# Patient Record
Sex: Male | Born: 1960 | Race: White | Hispanic: No | Smoking: Current some day smoker
Health system: Southern US, Community
[De-identification: ages and names within clinical notes are randomized; demographics above are authoritative.]

## PROBLEM LIST (undated history)

## (undated) DIAGNOSIS — Z720 Tobacco use: Secondary | ICD-10-CM

## (undated) DIAGNOSIS — I214 Non-ST elevation (NSTEMI) myocardial infarction: Secondary | ICD-10-CM

## (undated) DIAGNOSIS — E781 Pure hyperglyceridemia: Secondary | ICD-10-CM

## (undated) DIAGNOSIS — T4145XA Adverse effect of unspecified anesthetic, initial encounter: Secondary | ICD-10-CM

## (undated) DIAGNOSIS — I10 Essential (primary) hypertension: Secondary | ICD-10-CM

## (undated) DIAGNOSIS — I2510 Atherosclerotic heart disease of native coronary artery without angina pectoris: Secondary | ICD-10-CM

## (undated) DIAGNOSIS — M1990 Unspecified osteoarthritis, unspecified site: Secondary | ICD-10-CM

## (undated) DIAGNOSIS — R195 Other fecal abnormalities: Secondary | ICD-10-CM

## (undated) DIAGNOSIS — I509 Heart failure, unspecified: Secondary | ICD-10-CM

## (undated) DIAGNOSIS — J45909 Unspecified asthma, uncomplicated: Secondary | ICD-10-CM

## (undated) DIAGNOSIS — E785 Hyperlipidemia, unspecified: Secondary | ICD-10-CM

## (undated) DIAGNOSIS — J449 Chronic obstructive pulmonary disease, unspecified: Secondary | ICD-10-CM

## (undated) HISTORY — DX: Heart failure, unspecified: I50.9

## (undated) HISTORY — DX: Unspecified asthma, uncomplicated: J45.909

## (undated) HISTORY — PX: CARDIAC CATHETERIZATION: SHX172

## (undated) HISTORY — DX: Pure hyperglyceridemia: E78.1

## (undated) HISTORY — DX: Other fecal abnormalities: R19.5

## (undated) HISTORY — DX: Tobacco use: Z72.0

## (undated) HISTORY — DX: Essential (primary) hypertension: I10

## (undated) HISTORY — DX: Non-ST elevation (NSTEMI) myocardial infarction: I21.4

## (undated) HISTORY — DX: Hyperlipidemia, unspecified: E78.5

## (undated) HISTORY — PX: UMBILICAL HERNIA REPAIR: SHX196

## (undated) HISTORY — DX: Chronic obstructive pulmonary disease, unspecified: J44.9

## (undated) HISTORY — DX: Atherosclerotic heart disease of native coronary artery without angina pectoris: I25.10

---

## 1996-05-30 HISTORY — PX: CLOSED REDUCTION SHOULDER DISLOCATION: SUR242

## 2003-08-20 ENCOUNTER — Emergency Department (HOSPITAL_COMMUNITY): Admission: EM | Admit: 2003-08-20 | Discharge: 2003-08-20 | Payer: Self-pay | Admitting: Family Medicine

## 2004-06-09 ENCOUNTER — Ambulatory Visit (HOSPITAL_COMMUNITY): Admission: RE | Admit: 2004-06-09 | Discharge: 2004-06-09 | Payer: Self-pay | Admitting: Family Medicine

## 2006-09-30 ENCOUNTER — Emergency Department (HOSPITAL_COMMUNITY): Admission: EM | Admit: 2006-09-30 | Discharge: 2006-09-30 | Payer: Self-pay | Admitting: Emergency Medicine

## 2008-11-14 ENCOUNTER — Emergency Department (HOSPITAL_COMMUNITY): Admission: EM | Admit: 2008-11-14 | Discharge: 2008-11-14 | Payer: Self-pay | Admitting: Family Medicine

## 2008-11-16 ENCOUNTER — Emergency Department (HOSPITAL_COMMUNITY): Admission: EM | Admit: 2008-11-16 | Discharge: 2008-11-16 | Payer: Self-pay | Admitting: Family Medicine

## 2008-11-18 ENCOUNTER — Emergency Department (HOSPITAL_COMMUNITY): Admission: EM | Admit: 2008-11-18 | Discharge: 2008-11-18 | Payer: Self-pay | Admitting: Emergency Medicine

## 2008-12-29 ENCOUNTER — Emergency Department (HOSPITAL_COMMUNITY): Admission: EM | Admit: 2008-12-29 | Discharge: 2008-12-29 | Payer: Self-pay | Admitting: Family Medicine

## 2009-10-10 ENCOUNTER — Emergency Department (HOSPITAL_COMMUNITY): Admission: EM | Admit: 2009-10-10 | Discharge: 2009-10-10 | Payer: Self-pay | Admitting: Emergency Medicine

## 2009-10-17 ENCOUNTER — Inpatient Hospital Stay (HOSPITAL_COMMUNITY): Admission: EM | Admit: 2009-10-17 | Discharge: 2009-10-18 | Payer: Self-pay | Admitting: Emergency Medicine

## 2009-10-23 ENCOUNTER — Ambulatory Visit: Payer: Self-pay | Admitting: Internal Medicine

## 2009-12-16 ENCOUNTER — Ambulatory Visit: Payer: Self-pay | Admitting: Internal Medicine

## 2009-12-16 LAB — CONVERTED CEMR LAB
BUN: 24 mg/dL — ABNORMAL HIGH (ref 6–23)
CO2: 21 meq/L (ref 19–32)
Calcium: 9.3 mg/dL (ref 8.4–10.5)
Chloride: 103 meq/L (ref 96–112)
Creatinine, Ser: 0.88 mg/dL (ref 0.40–1.50)
Glucose, Bld: 198 mg/dL — ABNORMAL HIGH (ref 70–99)
Hgb A1c MFr Bld: 9.1 % — ABNORMAL HIGH (ref <5.7)
Potassium: 4.4 meq/L (ref 3.5–5.3)
Sodium: 135 meq/L (ref 135–145)

## 2010-03-11 ENCOUNTER — Ambulatory Visit: Payer: Self-pay | Admitting: Internal Medicine

## 2010-05-30 DIAGNOSIS — E1165 Type 2 diabetes mellitus with hyperglycemia: Secondary | ICD-10-CM

## 2010-05-30 DIAGNOSIS — IMO0002 Reserved for concepts with insufficient information to code with codable children: Secondary | ICD-10-CM

## 2010-05-30 HISTORY — DX: Type 2 diabetes mellitus with hyperglycemia: E11.65

## 2010-05-30 HISTORY — DX: Reserved for concepts with insufficient information to code with codable children: IMO0002

## 2010-08-16 LAB — RAPID URINE DRUG SCREEN, HOSP PERFORMED
Amphetamines: NOT DETECTED
Barbiturates: NOT DETECTED
Benzodiazepines: NOT DETECTED
Cocaine: NOT DETECTED
Opiates: NOT DETECTED
Tetrahydrocannabinol: POSITIVE — AB

## 2010-08-16 LAB — CULTURE, BLOOD (ROUTINE X 2)
Culture: NO GROWTH
Culture: NO GROWTH

## 2010-08-16 LAB — GLUCOSE, CAPILLARY
Glucose-Capillary: 131 mg/dL — ABNORMAL HIGH (ref 70–99)
Glucose-Capillary: 137 mg/dL — ABNORMAL HIGH (ref 70–99)
Glucose-Capillary: 152 mg/dL — ABNORMAL HIGH (ref 70–99)
Glucose-Capillary: 174 mg/dL — ABNORMAL HIGH (ref 70–99)
Glucose-Capillary: 250 mg/dL — ABNORMAL HIGH (ref 70–99)
Glucose-Capillary: 275 mg/dL — ABNORMAL HIGH (ref 70–99)
Glucose-Capillary: 279 mg/dL — ABNORMAL HIGH (ref 70–99)
Glucose-Capillary: 295 mg/dL — ABNORMAL HIGH (ref 70–99)
Glucose-Capillary: 301 mg/dL — ABNORMAL HIGH (ref 70–99)
Glucose-Capillary: 305 mg/dL — ABNORMAL HIGH (ref 70–99)

## 2010-08-16 LAB — MAGNESIUM: Magnesium: 2 mg/dL (ref 1.5–2.5)

## 2010-08-16 LAB — BASIC METABOLIC PANEL
BUN: 14 mg/dL (ref 6–23)
BUN: 19 mg/dL (ref 6–23)
CO2: 24 mEq/L (ref 19–32)
CO2: 26 mEq/L (ref 19–32)
Calcium: 8.6 mg/dL (ref 8.4–10.5)
Calcium: 9.5 mg/dL (ref 8.4–10.5)
Chloride: 101 mEq/L (ref 96–112)
Chloride: 95 mEq/L — ABNORMAL LOW (ref 96–112)
Creatinine, Ser: 0.7 mg/dL (ref 0.4–1.5)
Creatinine, Ser: 0.98 mg/dL (ref 0.4–1.5)
GFR calc Af Amer: >60 mL/min (ref >60)
GFR calc Af Amer: >60 mL/min (ref >60)
GFR calc non Af Amer: >60 mL/min (ref >60)
GFR calc non Af Amer: >60 mL/min (ref >60)
Glucose, Bld: 204 mg/dL — ABNORMAL HIGH (ref 70–99)
Glucose, Bld: 507 mg/dL — ABNORMAL HIGH (ref 70–99)
Potassium: 3.7 mEq/L (ref 3.5–5.1)
Potassium: 4.8 mEq/L (ref 3.5–5.1)
Sodium: 128 mEq/L — ABNORMAL LOW (ref 135–145)
Sodium: 133 mEq/L — ABNORMAL LOW (ref 135–145)

## 2010-08-16 LAB — CK TOTAL AND CKMB (NOT AT ARMC)
CK, MB: 0.8 ng/mL (ref 0.3–4.0)
CK, MB: 1.1 ng/mL (ref 0.3–4.0)
CK, MB: 1.4 ng/mL (ref 0.3–4.0)
Relative Index: INVALID (ref 0.0–2.5)
Relative Index: INVALID (ref 0.0–2.5)
Relative Index: INVALID (ref 0.0–2.5)
Total CK: 56 U/L (ref 7–232)
Total CK: 67 U/L (ref 7–232)
Total CK: 69 U/L (ref 7–232)

## 2010-08-16 LAB — DIFFERENTIAL
Basophils Absolute: 0.1 10*3/uL (ref 0.0–0.1)
Basophils Absolute: 0.1 10*3/uL (ref 0.0–0.1)
Basophils Relative: 1 % (ref 0–1)
Basophils Relative: 1 % (ref 0–1)
Eosinophils Absolute: 1.6 10*3/uL — ABNORMAL HIGH (ref 0.0–0.7)
Eosinophils Absolute: 1.7 10*3/uL — ABNORMAL HIGH (ref 0.0–0.7)
Eosinophils Relative: 10 % — ABNORMAL HIGH (ref 0–5)
Eosinophils Relative: 12 % — ABNORMAL HIGH (ref 0–5)
Lymphocytes Relative: 21 % (ref 12–46)
Lymphocytes Relative: 22 % (ref 12–46)
Lymphs Abs: 2.8 10*3/uL (ref 0.7–4.0)
Lymphs Abs: 3.7 10*3/uL (ref 0.7–4.0)
Monocytes Absolute: 0.7 10*3/uL (ref 0.1–1.0)
Monocytes Absolute: 0.9 10*3/uL (ref 0.1–1.0)
Monocytes Relative: 5 % (ref 3–12)
Monocytes Relative: 5 % (ref 3–12)
Neutro Abs: 10.4 10*3/uL — ABNORMAL HIGH (ref 1.7–7.7)
Neutro Abs: 8.6 10*3/uL — ABNORMAL HIGH (ref 1.7–7.7)
Neutrophils Relative %: 62 % (ref 43–77)
Neutrophils Relative %: 62 % (ref 43–77)

## 2010-08-16 LAB — LIPID PANEL
Cholesterol: 237 mg/dL — ABNORMAL HIGH (ref 0–200)
HDL: 26 mg/dL — ABNORMAL LOW (ref >39)
LDL Cholesterol: UNDETERMINED mg/dL (ref 0–99)
Total CHOL/HDL Ratio: 9.1 RATIO
Triglycerides: 519 mg/dL — ABNORMAL HIGH (ref <150)
VLDL: UNDETERMINED mg/dL (ref 0–40)

## 2010-08-16 LAB — CBC
HCT: 38.8 % — ABNORMAL LOW (ref 39.0–52.0)
HCT: 42.8 % (ref 39.0–52.0)
Hemoglobin: 13.6 g/dL (ref 13.0–17.0)
Hemoglobin: 14.9 g/dL (ref 13.0–17.0)
MCHC: 34.8 g/dL (ref 30.0–36.0)
MCHC: 34.9 g/dL (ref 30.0–36.0)
MCV: 87.2 fL (ref 78.0–100.0)
MCV: 88 fL (ref 78.0–100.0)
Platelets: 327 10*3/uL (ref 150–400)
Platelets: 335 10*3/uL (ref 150–400)
RBC: 4.41 MIL/uL (ref 4.22–5.81)
RBC: 4.9 MIL/uL (ref 4.22–5.81)
RDW: 13.7 % (ref 11.5–15.5)
RDW: 13.8 % (ref 11.5–15.5)
WBC: 13.8 10*3/uL — ABNORMAL HIGH (ref 4.0–10.5)
WBC: 16.7 10*3/uL — ABNORMAL HIGH (ref 4.0–10.5)

## 2010-08-16 LAB — URINE CULTURE
Colony Count: NO GROWTH
Culture: NO GROWTH

## 2010-08-16 LAB — EXPECTORATED SPUTUM ASSESSMENT W GRAM STAIN, RFLX TO RESP C

## 2010-08-16 LAB — CULTURE, RESPIRATORY W GRAM STAIN: Culture: NORMAL

## 2010-08-16 LAB — TROPONIN I
Troponin I: 0.01 ng/mL (ref 0.00–0.06)
Troponin I: <0.01 ng/mL (ref 0.00–0.06)
Troponin I: <0.01 ng/mL (ref 0.00–0.06)

## 2010-08-16 LAB — HEMOGLOBIN A1C
Hgb A1c MFr Bld: 12.9 % — ABNORMAL HIGH (ref <5.7)
Mean Plasma Glucose: 324 mg/dL — ABNORMAL HIGH (ref <117)

## 2010-08-16 LAB — URINALYSIS, ROUTINE W REFLEX MICROSCOPIC
Bilirubin Urine: NEGATIVE
Glucose, UA: >1000 mg/dL — AB
Hgb urine dipstick: NEGATIVE
Ketones, ur: NEGATIVE mg/dL
Leukocytes, UA: NEGATIVE
Nitrite: NEGATIVE
Protein, ur: NEGATIVE mg/dL
Specific Gravity, Urine: 1.031 — ABNORMAL HIGH (ref 1.005–1.030)
Urobilinogen, UA: 0.2 mg/dL (ref 0.0–1.0)
pH: 5 (ref 5.0–8.0)

## 2010-08-16 LAB — BRAIN NATRIURETIC PEPTIDE: Pro B Natriuretic peptide (BNP): <30 pg/mL (ref 0.0–100.0)

## 2010-08-16 LAB — TSH: TSH: 1.472 u[IU]/mL (ref 0.350–4.500)

## 2010-08-16 LAB — PHOSPHORUS: Phosphorus: 4 mg/dL (ref 2.3–4.6)

## 2010-08-16 LAB — URINE MICROSCOPIC-ADD ON

## 2010-08-29 DIAGNOSIS — I214 Non-ST elevation (NSTEMI) myocardial infarction: Secondary | ICD-10-CM | POA: Insufficient documentation

## 2010-08-29 HISTORY — DX: Non-ST elevation (NSTEMI) myocardial infarction: I21.4

## 2010-08-29 HISTORY — PX: CORONARY STENT PLACEMENT: SHX1402

## 2010-09-06 LAB — CULTURE, ROUTINE-ABSCESS

## 2010-09-19 ENCOUNTER — Emergency Department (HOSPITAL_COMMUNITY)
Admission: EM | Admit: 2010-09-19 | Discharge: 2010-09-19 | Discharge status: Against Medical Advice | Payer: Self-pay | Attending: Emergency Medicine | Admitting: Emergency Medicine

## 2010-09-19 ENCOUNTER — Emergency Department (HOSPITAL_COMMUNITY): Payer: Self-pay

## 2010-09-19 DIAGNOSIS — R079 Chest pain, unspecified: Secondary | ICD-10-CM | POA: Insufficient documentation

## 2010-09-19 DIAGNOSIS — E119 Type 2 diabetes mellitus without complications: Secondary | ICD-10-CM | POA: Insufficient documentation

## 2010-09-19 LAB — CBC
HCT: 43.4 % (ref 39.0–52.0)
Hemoglobin: 14.6 g/dL (ref 13.0–17.0)
MCH: 29.4 pg (ref 26.0–34.0)
MCHC: 33.6 g/dL (ref 30.0–36.0)
MCV: 87.5 fL (ref 78.0–100.0)
Platelets: 258 10*3/uL (ref 150–400)
RBC: 4.96 MIL/uL (ref 4.22–5.81)
RDW: 14.3 % (ref 11.5–15.5)
WBC: 9.9 10*3/uL (ref 4.0–10.5)

## 2010-09-19 LAB — POCT I-STAT, CHEM 8
BUN: 22 mg/dL (ref 6–23)
Calcium, Ion: 1.16 mmol/L (ref 1.12–1.32)
Chloride: 102 mEq/L (ref 96–112)
Creatinine, Ser: 1.1 mg/dL (ref 0.4–1.5)
Glucose, Bld: 304 mg/dL — ABNORMAL HIGH (ref 70–99)
HCT: 47 % (ref 39.0–52.0)
Hemoglobin: 16 g/dL (ref 13.0–17.0)
Potassium: 3.7 mEq/L (ref 3.5–5.1)
Sodium: 137 mEq/L (ref 135–145)
TCO2: 24 mmol/L (ref 0–100)

## 2010-09-19 LAB — DIFFERENTIAL
Basophils Absolute: 0 10*3/uL (ref 0.0–0.1)
Basophils Relative: 0 % (ref 0–1)
Eosinophils Absolute: 0.3 10*3/uL (ref 0.0–0.7)
Eosinophils Relative: 3 % (ref 0–5)
Lymphocytes Relative: 51 % — ABNORMAL HIGH (ref 12–46)
Lymphs Abs: 5 10*3/uL — ABNORMAL HIGH (ref 0.7–4.0)
Monocytes Absolute: 0.7 10*3/uL (ref 0.1–1.0)
Monocytes Relative: 7 % (ref 3–12)
Neutro Abs: 3.8 10*3/uL (ref 1.7–7.7)
Neutrophils Relative %: 39 % — ABNORMAL LOW (ref 43–77)

## 2010-09-19 LAB — COMPREHENSIVE METABOLIC PANEL
ALT: 23 U/L (ref 0–53)
AST: 21 U/L (ref 0–37)
Albumin: 4 g/dL (ref 3.5–5.2)
Alkaline Phosphatase: 50 U/L (ref 39–117)
BUN: 19 mg/dL (ref 6–23)
CO2: 24 mEq/L (ref 19–32)
Calcium: 10 mg/dL (ref 8.4–10.5)
Chloride: 99 mEq/L (ref 96–112)
Creatinine, Ser: 1.08 mg/dL (ref 0.4–1.5)
GFR calc Af Amer: >60 mL/min (ref >60)
GFR calc non Af Amer: >60 mL/min (ref >60)
Glucose, Bld: 305 mg/dL — ABNORMAL HIGH (ref 70–99)
Potassium: 3.7 mEq/L (ref 3.5–5.1)
Sodium: 136 mEq/L (ref 135–145)
Total Bilirubin: 0.6 mg/dL (ref 0.3–1.2)
Total Protein: 7.5 g/dL (ref 6.0–8.3)

## 2010-09-19 LAB — POCT CARDIAC MARKERS
CKMB, poc: 2 ng/mL (ref 1.0–8.0)
Myoglobin, poc: 75.8 ng/mL (ref 12–200)
Troponin i, poc: <0.05 ng/mL (ref 0.00–0.09)

## 2010-09-21 ENCOUNTER — Emergency Department (HOSPITAL_COMMUNITY): Payer: Self-pay

## 2010-09-21 ENCOUNTER — Inpatient Hospital Stay (HOSPITAL_COMMUNITY)
Admission: EM | Admit: 2010-09-21 | Discharge: 2010-09-23 | DRG: 249 | Disposition: A | Discharge status: Home / Self Care | Payer: Self-pay | Attending: Cardiology | Admitting: Cardiology

## 2010-09-21 DIAGNOSIS — F17200 Nicotine dependence, unspecified, uncomplicated: Secondary | ICD-10-CM | POA: Diagnosis present

## 2010-09-21 DIAGNOSIS — I2510 Atherosclerotic heart disease of native coronary artery without angina pectoris: Secondary | ICD-10-CM | POA: Diagnosis present

## 2010-09-21 DIAGNOSIS — I214 Non-ST elevation (NSTEMI) myocardial infarction: Principal | ICD-10-CM | POA: Diagnosis present

## 2010-09-21 DIAGNOSIS — E119 Type 2 diabetes mellitus without complications: Secondary | ICD-10-CM | POA: Diagnosis present

## 2010-09-21 DIAGNOSIS — I10 Essential (primary) hypertension: Secondary | ICD-10-CM | POA: Diagnosis present

## 2010-09-21 DIAGNOSIS — E785 Hyperlipidemia, unspecified: Secondary | ICD-10-CM | POA: Diagnosis present

## 2010-09-21 DIAGNOSIS — R079 Chest pain, unspecified: Secondary | ICD-10-CM

## 2010-09-21 LAB — POCT I-STAT, CHEM 8
BUN: 14 mg/dL (ref 6–23)
Calcium, Ion: 0.98 mmol/L — ABNORMAL LOW (ref 1.12–1.32)
Chloride: 106 mEq/L (ref 96–112)
Creatinine, Ser: 0.8 mg/dL (ref 0.4–1.5)
Glucose, Bld: 226 mg/dL — ABNORMAL HIGH (ref 70–99)
HCT: 44 % (ref 39.0–52.0)
Hemoglobin: 15 g/dL (ref 13.0–17.0)
Potassium: 4.4 mEq/L (ref 3.5–5.1)
Sodium: 135 mEq/L (ref 135–145)
TCO2: 25 mmol/L (ref 0–100)

## 2010-09-21 LAB — POCT CARDIAC MARKERS
CKMB, poc: 1.7 ng/mL (ref 1.0–8.0)
CKMB, poc: 1.2 ng/mL (ref 1.0–8.0)
Myoglobin, poc: 51.2 ng/mL (ref 12–200)
Myoglobin, poc: 57.8 ng/mL (ref 12–200)
Troponin i, poc: 0.08 ng/mL (ref 0.00–0.09)
Troponin i, poc: 0.05 ng/mL (ref 0.00–0.09)

## 2010-09-21 LAB — OCCULT BLOOD, POC DEVICE: Fecal Occult Bld: POSITIVE

## 2010-09-21 MED ORDER — IOHEXOL 300 MG/ML  SOLN
100.0000 mL | Freq: Once | INTRAMUSCULAR | Status: AC | PRN
  Administered 2010-09-21: 100 mL via INTRAVENOUS

## 2010-09-22 DIAGNOSIS — I2510 Atherosclerotic heart disease of native coronary artery without angina pectoris: Secondary | ICD-10-CM

## 2010-09-22 LAB — CARDIAC PANEL(CRET KIN+CKTOT+MB+TROPI)
CK, MB: 2.9 ng/mL (ref 0.3–4.0)
CK, MB: 2.5 ng/mL (ref 0.3–4.0)
Relative Index: INVALID (ref 0.0–2.5)
Relative Index: INVALID (ref 0.0–2.5)
Total CK: 83 U/L (ref 7–232)
Total CK: 85 U/L (ref 7–232)
Troponin I: 0.39 ng/mL — ABNORMAL HIGH (ref 0.00–0.06)
Troponin I: 0.4 ng/mL — ABNORMAL HIGH (ref 0.00–0.06)

## 2010-09-22 LAB — CBC
HCT: 43.4 % (ref 39.0–52.0)
Hemoglobin: 14.8 g/dL (ref 13.0–17.0)
MCH: 30.1 pg (ref 26.0–34.0)
MCHC: 34.1 g/dL (ref 30.0–36.0)
MCV: 88.2 fL (ref 78.0–100.0)
Platelets: 248 10*3/uL (ref 150–400)
RBC: 4.92 MIL/uL (ref 4.22–5.81)
RDW: 14.3 % (ref 11.5–15.5)
WBC: 11.2 10*3/uL — ABNORMAL HIGH (ref 4.0–10.5)

## 2010-09-22 LAB — BASIC METABOLIC PANEL
BUN: 13 mg/dL (ref 6–23)
CO2: 26 mEq/L (ref 19–32)
Calcium: 9.7 mg/dL (ref 8.4–10.5)
Chloride: 103 mEq/L (ref 96–112)
Creatinine, Ser: 0.83 mg/dL (ref 0.4–1.5)
GFR calc Af Amer: >60 mL/min (ref >60)
GFR calc non Af Amer: >60 mL/min (ref >60)
Glucose, Bld: 201 mg/dL — ABNORMAL HIGH (ref 70–99)
Potassium: 4 mEq/L (ref 3.5–5.1)
Sodium: 133 mEq/L — ABNORMAL LOW (ref 135–145)

## 2010-09-22 LAB — POCT ACTIVATED CLOTTING TIME: Activated Clotting Time: 317 seconds

## 2010-09-22 LAB — GLUCOSE, CAPILLARY
Glucose-Capillary: 205 mg/dL — ABNORMAL HIGH (ref 70–99)
Glucose-Capillary: 168 mg/dL — ABNORMAL HIGH (ref 70–99)
Glucose-Capillary: 116 mg/dL — ABNORMAL HIGH (ref 70–99)
Glucose-Capillary: 120 mg/dL — ABNORMAL HIGH (ref 70–99)
Glucose-Capillary: 126 mg/dL — ABNORMAL HIGH (ref 70–99)

## 2010-09-22 LAB — HEMOGLOBIN A1C
Hgb A1c MFr Bld: 8.5 % — ABNORMAL HIGH (ref <5.7)
Mean Plasma Glucose: 197 mg/dL — ABNORMAL HIGH (ref <117)

## 2010-09-22 LAB — PROTIME-INR
INR: 0.96 (ref 0.00–1.49)
Prothrombin Time: 13 seconds (ref 11.6–15.2)

## 2010-09-22 LAB — LIPID PANEL
Cholesterol: 301 mg/dL — ABNORMAL HIGH (ref 0–200)
HDL: 31 mg/dL — ABNORMAL LOW (ref >39)
LDL Cholesterol: UNDETERMINED mg/dL (ref 0–99)
Total CHOL/HDL Ratio: 9.7 RATIO
Triglycerides: 462 mg/dL — ABNORMAL HIGH (ref <150)
VLDL: UNDETERMINED mg/dL (ref 0–40)

## 2010-09-22 LAB — CK TOTAL AND CKMB (NOT AT ARMC)
CK, MB: 2.5 ng/mL (ref 0.3–4.0)
Relative Index: INVALID (ref 0.0–2.5)
Total CK: 86 U/L (ref 7–232)

## 2010-09-22 LAB — TROPONIN I: Troponin I: 0.33 ng/mL — ABNORMAL HIGH (ref 0.00–0.06)

## 2010-09-22 NOTE — H&P (Signed)
NAMEAZARIAH, BONURA       ACCOUNT NO.:  [id number]  MEDICAL RECORD NO.:  [id number]           PATIENT TYPE:  I  LOCATION:  6526                         FACILITY:  [facility]  PHYSICIAN:  Zacarias Pontes, MD       DATE OF BIRTH:  [birth year]  DATE OF ADMISSION:  09/21/2010 DATE OF DISCHARGE:                             HISTORY & PHYSICAL   LOCATION:  Evaluated in the emergency room.  PRIMARY CARDIOLOGIST:  None established.  CHIEF COMPLAINT:  Chest pain.  HISTORY OF PRESENT ILLNESS:  This is a 50 year old gentleman with poorly controlled diabetes mellitus, ongoing tobacco use, hypertriglyceridemia, who presents with chest pain for 2 weeks, worsening in the past 72 hours.  Approximately 2 weeks ago, Mr. Tremaine reports noticing left lower chest pain while mowing his lawn.  He describes the pain as sharp and stabbing and it tends to associated with activity.  He also notes that the pain is pleuritic in nature. He denies cough, shortness of breath, nausea, or associated diaphoresis.  Three days ago he experienced a return of these symptoms and again describes a sharp, stabbing chest pain in a focal location in his left lower chest.  He reports taking borrowed, old sublingual nitroglycerin (not originally prescribed to him) with supposed subsequent relief.  Between Sunday and yesterday he reports having taken nearly 10 sublingual nitroglycerin tablets for his chest discomfort.  He presented to [facility] on Sunday and was evaluated in the emergency room and subsequently discharged.  He now returns with the same symptoms and with a stated concern that he is potentially having a heart attack. He is thus admitted for further evaluation and management.  PAST MEDICAL HISTORY: 1. Diabetes type 2. 2. Hypertriglyceridemia. 3. Active tobacco use. 4. Hypertension.  SOCIAL HISTORY:  He is an active smoker to the tune of half-a-pack per day for the last 40 years.  He is not  currently working though he is trying to get his driver's license so that he might be able to then obtain a job.  He denies alcohol and drug use.  He is unmarried and does not have any children.  FAMILY HISTORY:  His brother has premature coronary artery disease requiring PCI in his mid 70s.  Also, of note, the patient's father died 3 years ago of an MI in more senior age.  REVIEW OF SYSTEMS:  As per HPI, otherwise is comprehensively negative.  ALLERGIES:  CODEINE.  MEDICATIONS: 1. Aspirin 81 mg daily which he actually reports taking 2-3 times per     week. 2. Metformin 1000 mg p.o. b.i.d.  PHYSICAL EXAM:  A comprehensive cardiovascular exam was performed. VITAL SIGNS:  The patient is afebrile with a blood pressure of 136/88, breathing comfortably at 16 times per minute, satting 98% on 2 liters nasal cannula. HEENT:  Notable for a neck that is supple with no masses or lymphadenopathy.  JVP is not appreciably elevated. CARDIAC:  Notable for a normal S1-S2 with no murmurs, rubs, or gallops detected. LUNGS:  Clear to auscultation bilaterally.  No wheezes or crackles. ABDOMEN:  Soft, nontender, nondistended with positive bowel sounds and no abdominal bruits. EXTREMITIES:  Warm and well  perfused with no lower extremity edema. NEUROLOGIC:  He is alert and oriented x3 with no detectable neurologic deficits.  LABORATORY EVALUATION:  His white count is 9000, hematocrit 44, platelets 298,000.  His sodium is 135, potassium 4.4, chloride 106, bicarb 25, BUN 14, creatinine 0.8 with a glucose elevated at 226. Troponins checked x2 in the emergency room are within normal limits at 0.05 and 0.08.  EKG demonstrates normal sinus rhythm with no ST abnormalities.  He does have some evidence of early repolarization in II, III, and aVF similar to his ECG several days ago at [facility].  IMPRESSION:  This is a 50 year old gentleman with definite coronary risk factors which are poorly controlled  as well as a positive family history for premature coronary artery disease who presents with an atypical chest pain syndrome with an unremarkable ECG and negative initial enzymes.  Despite inadequate control of his risk factors, I have a low suspicion for an acute coronary syndrome given the atypical nature and description of his symptoms.  Nevertheless, given his risk factors, we are obligated to complete a comprehensive rule out and proceed with further evaluation.  PLANS:  We will admit him for a complete enzyme curve and serial ECGs. I do not think he requires intravenous heparin infusion at this time. Once his enzyme curve is complete, I think he warrants a stress test in the morning to more formally evaluate whether he has any inducible ischemia.  Given his 2 emergency room visits in the past 72 hours, a normal result would hopefully provide him with reassurance.  I spent considerable time speaking with the patient about his risk factors and the way in which they can be modified.  We specifically talked about his smoking and his dietary habits.  His diabetes is inadequately controlled. From my review of his history, it seems he may have previously been on insulin. I suspect his current Metformin-only outpatient regimen was constructed in an effort to achieve any degree of compliance. He will  need intensive re-education.    I explained the plan to him and he voiced understanding.  I answered his questions to the best of my ability.          ______________________________ Zacarias Pontes, MD     DM/MEDQ  D:  09/22/2010  T:  09/22/2010  Job:  [id number]  Electronically Signed by Zacarias Pontes MD on 09/22/2010 09:00:11 AM

## 2010-09-23 DIAGNOSIS — I214 Non-ST elevation (NSTEMI) myocardial infarction: Secondary | ICD-10-CM

## 2010-09-23 LAB — BASIC METABOLIC PANEL
BUN: 9 mg/dL (ref 6–23)
CO2: 25 mEq/L (ref 19–32)
Calcium: 9.1 mg/dL (ref 8.4–10.5)
Chloride: 101 mEq/L (ref 96–112)
Creatinine, Ser: 0.87 mg/dL (ref 0.4–1.5)
GFR calc Af Amer: >60 mL/min (ref >60)
GFR calc non Af Amer: >60 mL/min (ref >60)
Glucose, Bld: 187 mg/dL — ABNORMAL HIGH (ref 70–99)
Potassium: 4.1 mEq/L (ref 3.5–5.1)
Sodium: 135 mEq/L (ref 135–145)

## 2010-09-23 LAB — CBC
HCT: 41.4 % (ref 39.0–52.0)
Hemoglobin: 13.9 g/dL (ref 13.0–17.0)
MCH: 29.7 pg (ref 26.0–34.0)
MCHC: 33.6 g/dL (ref 30.0–36.0)
MCV: 88.5 fL (ref 78.0–100.0)
Platelets: 239 10*3/uL (ref 150–400)
RBC: 4.68 MIL/uL (ref 4.22–5.81)
RDW: 14.5 % (ref 11.5–15.5)
WBC: 8.6 10*3/uL (ref 4.0–10.5)

## 2010-09-23 LAB — GLUCOSE, CAPILLARY: Glucose-Capillary: 207 mg/dL — ABNORMAL HIGH (ref 70–99)

## 2010-09-29 NOTE — Cardiovascular Report (Signed)
NAME:  [name] Newton, [name] Newton       ACCOUNT NO.:  [id number]  MEDICAL RECORD NO.:  [id number]           PATIENT TYPE:  I  LOCATION:  6526                         FACILITY:  [facility]  PHYSICIAN:  Lorine Bears, MD     DATE OF BIRTH:  [birth year]  DATE OF PROCEDURE: DATE OF DISCHARGE:                           CARDIAC CATHETERIZATION   PROCEDURES PERFORMED: 1. Left heart catheterization. 2. Coronary angiography. 3. Left ventricular angiography. 4. Angioplasty and a bare-metal stent placement on proximal first     diagonal.  ACCESS:  Right femoral artery.  INDICATIONS AND CLINICAL HISTORY:  This is a 50 year old gentleman with multiple risk factors for coronary artery disease that include uncontrolled diabetes, hypertension, hyperlipidemia, and tobacco use. He presented with symptoms of chest pain.  He ruled in for myocardial infarction with slight elevation in cardiac enzymes.  Due to his presentation, cardiac catheterization was recommended.  Risks, benefits, and alternatives were discussed with the patient.  STUDY DETAILS:  A standard informed consent was obtained.  The right groin area was prepped in a sterile fashion.  It was anesthetized with 1% lidocaine.  A 5-French sheath was placed in the right femoral artery after an anterior puncture.  Coronary angiography was performed with a JL-4, JR-4, and pigtail catheter.  All catheter exchanges were done over the wire.  INTERVENTIONAL PROCEDURE DETAILS:  The sheath was exchanged to a 6- [place] sheath.  He was given Effient 60 mg x1.  IV bivalirudin was started with therapeutic ACT.  An XB 3.5 guiding catheter was used. This diagonal lesion was a true bifurcation lesion involving a large branch that supplies most of the lateral wall and a medium-sized branch. I used an intuition wire and was able to wire the medium-sized branch, which is the superior one.  I could not wire the inferior branch due to angulation.  Thus, I decided  to predilate the lesion with the wire in the superior branch.  I used a 2.5 x 15-mm apex balloon and inflated it to 6 atmospheres.  I then used a Prowater wire and was able to send it down the inferior large branch of the diagonal without difficulty and after multiple attempts.  I used the same balloon and dilated the lesion down this branch to 10 atmospheres twice.  I then placed a 2.75 x 18-mm Vision bare-metal stent and deployed it at 10 atmospheres.  The wire was then withdrawn from the superior branch, which was trapped by the stent. The stent was postdilated with a 2.75 x 15-mm Trumann Quantum to 14 atmospheres.  Angiography showed excellent results and TIMI 3 flow in both branches.  The guiding catheter was then removed over a wire.  The sheath was sutured in place to be removed with manual pressure after 2 hours.  The patient tolerated the procedure well with no immediate complications.  STUDY FINDINGS:  Hemodynamic findings:  Left ventricular pressure is 103/1 with the left ventricular end-diastolic pressure of 11 mmHg. Aortic pressure is 107/67 with a mean pressure of 87 mmHg.  Left ventricular angiography:  This showed normal LV systolic function with an estimated ejection fraction of 60% with mild anterolateral hypokinesis.  CORONARY ANGIOGRAPHY:  Left main coronary artery:  The vessel was normal in size with a 10-20% distal stenosis at the bifurcation. Left anterior descending artery:  The vessel was normal in size with mild calcifications throughout its course.  The vessel tapers in size significantly after giving first large diagonal branch.  In the mid LAD, there is a diffuse 30% disease.  The distal LAD has minor irregularities.  The first diagonal branches is a very large-sized branch and actually supplies most of the high lateral wall.  It almost functions as a dual LAD system.  A 90% stenosis is noted proximally at the bifurcation of a medium-sized superior branch.  The  vessel then gives two distal branches, which are free of significant disease. Left circumflex artery:  The vessel was normal in size and nondominant. Has 20% mid stenosis after OM-1, but otherwise no obstructive disease. OM-1 is a small-sized branch and free of significant disease.  OM-2 and OM-3 are normal-sized branches and free of significant disease. Right coronary artery:  The vessel is very large in size and dominant. There is a 20% stenosis in the midsegment, which is tubular.  The right PDA is large in size with a 20% ostial stenosis.  The first posterolateral branch is normal in size and free of significant disease. Second posterolateral branch is small in size overall.  STUDY CONCLUSIONS: 1. Severe single-vessel coronary artery disease. 2. Normal LV systolic function. 3. Successful angioplasty and a bare-metal stent placement to 90%     proximal large diagonal branch, which is a bifurcation lesion.  A     balloon angioplasty was performed on the superior branch before     stent deployment.  There was 0% residual stenosis in the     main diagonal branch and TIMI 3 flow after stent placement.  This diagonal branch is very    large in size and function as a dual LAD system.  A bare-metal stent     was used due to inability to afford long-term dual antiplatelet     therapy.  RECOMMENDATIONS: 1. Aspirin daily indefinitely. 2. Effient 10 mg once daily for at least 1 month. 3. Aggressive treatment of risk factors. 4. Smoking cessation.     Lorine Bears, MD     MA/MEDQ  D:  09/22/2010  T:  09/23/2010  Job:  [id number]  Electronically Signed by Lorine Bears MD on 09/29/2010 03:43:35 PM

## 2010-10-05 NOTE — Discharge Summary (Signed)
[name] Newton, [name] Newton       ACCOUNT NO.:  [id number]  MEDICAL RECORD NO.:  [id number]           PATIENT TYPE:  I  LOCATION:  6526                         FACILITY:  [facility]  PHYSICIAN:  Vesta Mixer, M.D. DATE OF BIRTH:  08-Apr-1961  DATE OF ADMISSION:  09/21/2010 DATE OF DISCHARGE:  09/23/2010                              DISCHARGE SUMMARY   PRIMARY CARDIOLOGIST:  Vesta Mixer, MD  PRIMARY CARE PROVIDER:  Dr. Andrey Campanile at [facility].  DISCHARGE DIAGNOSIS: 1. Non-ST-segment elevation myocardial infarction. 2. Coronary artery disease, status post successful PCI and bare-metal     stenting of the first diagonal this admission. 3. Type 2 diabetes mellitus. 4. Hyperlipidemia with hypertriglyceridemia. 5. Ongoing tobacco abuse. 6. Hypertension. 7. Guaiac positive stool with stable H and H.  ALLERGIES:  CODEINE.  PROCEDURES:  Left heart cardiac catheterization performed on September 22, 2010, revealing a 90% stenosis in a large first diagonal branch of the LAD with otherwise nonobstructive disease involving the LAD, circumflex and right coronary artery.  EF was 60% with mild anterolateral hypokinesis.  The first diagonal was successfully stented with a 2.75 x 18 mm Vision bare-metal stent.  HISTORY OF PRESENT ILLNESS:  This is a 50 year old male with prior history of hypertension, hyperlipidemia, tobacco abuse and diabetes mellitus, who was in his usual state of health until approximately 2 weeks prior to admission when he began to experience intermittent exertional left-sided chest pressure without associated symptoms relieving with rest.  Approximately 3 days prior to admission, the patient had began to experience increasing frequency of chest discomfort and also started taking sublingual nitroglycerin with relief (he borrowed the nitroglycerin from someone else).  He presented to [facility] on September 21, 2010, secondary to recurrence of symptoms and ECG  showed no acute changes with evidence of early repolarization and inferior leads while he had mild elevation of troponin 0.08.  He was admitted to [facility] for further evaluation.  HOSPITAL COURSE:  The patient ruled in for non-ST-segment elevation myocardial infarction eventually peaking his CK at 86, MB at 2.9, and troponin I at 0.40.  The patient had no recurrence of chest discomfort and decision was made to pursue cardiac catheterization which took place on September 22, 2010, revealing a 90% stenosis in the first diagonal branch of the LAD and otherwise nonobstructive disease, normal LV function. First diagonal was successfully stented with 2.75 x 18 mm Vision bare- metal stent.  The patient tolerated this procedure well and was maintained on low-dose aspirin, Effient, and statin therapy.  He has been counseling on the importance of smoking cessation as well.  We have not been able to initiate beta-blocker therapy secondary to baseline bradycardia with rate in the 50s-60s along with borderline blood pressures with systolics in the mid 90s to low 100s.  The patient has been ambulating without recurrent symptoms or limitations and has been evaluated by Cardiac Rehab.  Of note, on admission the patient was noted to have 1 fecal occult blood positive stool.  His H and H have remained stable despite anticoagulation in his had no further stools.  We recommended primary care follow up and the patient has follow  up with [facility] scheduled next month.  The patient will be discharged home today in good condition.  DISCHARGE LABS:  Hemoglobin 13.9, hematocrit 41.4, WBC 8.6, platelets 239,000.  Sodium 135, potassium 4.1, chloride 101, CO2 of 25, BUN 9, creatinine 0.87, glucose 187, total bilirubin 0.6, alkaline phosphatase 50, AST 21, ALT 23, total protein 7.5, albumin 4.0, calcium 9.1, hemoglobin A1c 8.5, CK 85, MB 2.5, troponin-I 0.40, total cholesterol 301, triglycerides 462, HDL 31, LDL  not calculated.  Fecal occult blood was positive.  DISPOSITION:  The patient will be discharged home today in good condition.  FOLLOWUP PLANS AND APPOINTMENTS:  The patient will follow up with Norma Fredrickson, nurse practitioner at [facility] on Oct 12, 2010, at 9:15 a.m.  Follow up with Dr. Andrey Campanile at [facility] on Oct 20, 2010, at 2 p.m.  DISCHARGE MEDICATIONS: 1. Nitroglycerin 0.4 mg sublingual p.r.n. chest pain. 2. Prasugrel 10 mg daily. 3. Simvastatin 40 mg nightly. 4. Metformin 1000 mg b.i.d. to be resumed on September 25, 2010. 5. Aspirin 81 mg daily.  OUTSTANDING LABS AND STUDIES:  Follow up lipids and LFTs in 6-8 weeks. Follow up fecal occult blood testing.  DURATION DISCHARGE ENCOUNTER:  60 minutes including physician time.     Nicolasa Ducking, ANP   ______________________________ Vesta Mixer, M.D.    CB/MEDQ  D:  09/23/2010  T:  09/24/2010  Job:  [id number]  cc:   Dr. Andrey Campanile  Electronically Signed by Nicolasa Ducking ANP on 09/25/2010 04:04:15 PM Electronically Signed by Kristeen Miss M.D. on 10/05/2010 06:17:32 PM

## 2010-10-08 ENCOUNTER — Encounter: Payer: Self-pay | Admitting: Nurse Practitioner

## 2010-10-08 ENCOUNTER — Telehealth: Payer: Self-pay | Admitting: Cardiology

## 2010-10-08 DIAGNOSIS — E781 Pure hyperglyceridemia: Secondary | ICD-10-CM | POA: Insufficient documentation

## 2010-10-08 DIAGNOSIS — R079 Chest pain, unspecified: Secondary | ICD-10-CM | POA: Insufficient documentation

## 2010-10-08 DIAGNOSIS — I10 Essential (primary) hypertension: Secondary | ICD-10-CM | POA: Insufficient documentation

## 2010-10-08 DIAGNOSIS — I2510 Atherosclerotic heart disease of native coronary artery without angina pectoris: Secondary | ICD-10-CM | POA: Insufficient documentation

## 2010-10-08 DIAGNOSIS — R195 Other fecal abnormalities: Secondary | ICD-10-CM | POA: Insufficient documentation

## 2010-10-08 DIAGNOSIS — Z720 Tobacco use: Secondary | ICD-10-CM | POA: Insufficient documentation

## 2010-10-08 NOTE — Telephone Encounter (Signed)
Patient aware that Effient through the [facility] is at the front for him to pick up.

## 2010-10-12 ENCOUNTER — Other Ambulatory Visit: Payer: Self-pay | Admitting: Nurse Practitioner

## 2010-10-12 ENCOUNTER — Ambulatory Visit (INDEPENDENT_AMBULATORY_CARE_PROVIDER_SITE_OTHER): Payer: Self-pay | Admitting: Nurse Practitioner

## 2010-10-12 ENCOUNTER — Encounter: Payer: Self-pay | Admitting: Nurse Practitioner

## 2010-10-12 VITALS — BP 110/78 | HR 60 | Ht 76.0 in | Wt 223.4 lb

## 2010-10-12 DIAGNOSIS — Z720 Tobacco use: Secondary | ICD-10-CM

## 2010-10-12 DIAGNOSIS — R195 Other fecal abnormalities: Secondary | ICD-10-CM

## 2010-10-12 DIAGNOSIS — E785 Hyperlipidemia, unspecified: Secondary | ICD-10-CM

## 2010-10-12 DIAGNOSIS — I2510 Atherosclerotic heart disease of native coronary artery without angina pectoris: Secondary | ICD-10-CM

## 2010-10-12 LAB — BASIC METABOLIC PANEL
BUN: 13 mg/dL (ref 6–23)
CO2: 26 mEq/L (ref 19–32)
Calcium: 9.5 mg/dL (ref 8.4–10.5)
Chloride: 106 mEq/L (ref 96–112)
Creatinine, Ser: 0.8 mg/dL (ref 0.4–1.5)
GFR: 106.93 mL/min (ref >60.00)
Glucose, Bld: 84 mg/dL (ref 70–99)
Potassium: 4.3 mEq/L (ref 3.5–5.1)
Sodium: 139 mEq/L (ref 135–145)

## 2010-10-12 LAB — CBC WITH DIFFERENTIAL/PLATELET
Basophils Absolute: 0 10*3/uL (ref 0.0–0.1)
Basophils Relative: 0.5 % (ref 0.0–3.0)
Eosinophils Absolute: 0.4 10*3/uL (ref 0.0–0.7)
Eosinophils Relative: 4.5 % (ref 0.0–5.0)
HCT: 41.1 % (ref 39.0–52.0)
Hemoglobin: 14.1 g/dL (ref 13.0–17.0)
Lymphocytes Relative: 31 % (ref 12.0–46.0)
Lymphs Abs: 2.4 10*3/uL (ref 0.7–4.0)
MCHC: 34.3 g/dL (ref 30.0–36.0)
MCV: 89.4 fl (ref 78.0–100.0)
Monocytes Absolute: 0.6 10*3/uL (ref 0.1–1.0)
Monocytes Relative: 7.1 % (ref 3.0–12.0)
Neutro Abs: 4.5 10*3/uL (ref 1.4–7.7)
Neutrophils Relative %: 56.9 % (ref 43.0–77.0)
Platelets: 270 10*3/uL (ref 150.0–400.0)
RBC: 4.6 Mil/uL (ref 4.22–5.81)
RDW: 14.6 % (ref 11.5–14.6)
WBC: 7.9 10*3/uL (ref 4.5–10.5)

## 2010-10-12 MED ORDER — PRAVASTATIN SODIUM 40 MG PO TABS: 40.0000 mg | ORAL_TABLET | Freq: Every evening | ORAL | Status: DC

## 2010-10-12 NOTE — Progress Notes (Signed)
Labs given and will stay on effient.

## 2010-10-12 NOTE — Progress Notes (Signed)
[name] Newton Date of Birth: 09/15/1960   History of Present Illness: [name] Newton is seen today for a post hospital visit. He is seen for Dr. Elease Hashimoto. He had a NSTEMI back in April, treated with BMS to the diagonal. He had non obstructive disease otherwise. EF is normal. He is on Effient. There was concern for one heme positive stool. He has done well since discharge. No problems with his groin. No recurrent chest pain. He is down to smoking only 4 cigs per day (down from over 2 packs per day). He is not bleeding or bruising. He was not able to afford the Zocor. He is walking some. He is followed at [facility] for his diabetes.  Current Outpatient Prescriptions on File Prior to Visit  Medication Sig Dispense Refill  . aspirin 81 MG tablet Take 81 mg by mouth 3 (three) times a week.        . metFORMIN (GLUCOPHAGE) 1000 MG tablet Take 1,000 mg by mouth 2 (two) times daily with a meal.        . nitroGLYCERIN (NITROSTAT) 0.4 MG SL tablet Place 0.4 mg under the tongue every 5 (five) minutes as needed.        . prasugrel (EFFIENT) 10 MG TABS Take 10 mg by mouth daily.        . pravastatin (PRAVACHOL) 40 MG tablet Take 1 tablet (40 mg total) by mouth every evening.  30 tablet  11  . DISCONTD: simvastatin (ZOCOR) 40 MG tablet Take 40 mg by mouth at bedtime.          Allergies  Allergen Reactions  . Codeine     Past Medical History  Diagnosis Date  . Diabetes mellitus   . Tobacco abuse   . Hyperlipidemia   . Hypertension   . NSTEMI (non-ST elevated myocardial infarction) April 2012    status post successful PCI and bare-metal  stenting of the first diagonal this admission.   . Guaiac positive stools      Guaiac positive stool with stable H and H.     Past Surgical History  Procedure Date  . Cardiac catheterization     Normal EF. 90% diagonal lesion  . Umbilical hernia repair   . Coronary stent placement April 2012    Diagonal lesion. Bare metal    History  Smoking status    . Current Everyday Smoker -- 4.0 packs/day  . Types: Cigarettes  Smokeless tobacco  . Not on file    History  Alcohol Use No    Family History  Problem Relation Age of Onset  . Heart attack Father   . Coronary artery disease Brother     Review of Systems: The review of systems is as above. He has some joint issues. He is smoking less. Overall he is feeling better since discharge.  All other systems were reviewed and are negative.  Physical Exam: BP 110/78  Pulse 60  Ht 6\' 4"  (1.93 m)  Wt 223 lb 6.4 oz (101.334 kg)  BMI 27.19 kg/m2 Patient is very pleasant and in no acute distress. He smells of tobacco. Skin is warm and dry. Color is normal.  HEENT is unremarkable. Normocephalic/atraumatic. PERRL. Sclera are nonicteric. Neck is supple. No masses. No JVD. Lungs are clear. Cardiac exam shows a regular rate and rhythm. Abdomen is soft. Groin is ok. He has mild bruising that is resolving. Extremities are without edema. Gait and ROM are intact. No gross neurologic deficits noted.  LABORATORY DATA:  Assessment / Plan:

## 2010-10-12 NOTE — Patient Instructions (Signed)
We are going to switch you from simvastatin to pravastatin. The prescription is at the drug store. It should be $4.00 Stay on your other medicines. Walk every day. Your goal is 45 minutes per day. We are going to check some labs today. I will see you back in about 1 month. Call for any problems

## 2010-10-12 NOTE — Assessment & Plan Note (Signed)
He is counseled in smoking cessation.

## 2010-10-12 NOTE — Telephone Encounter (Signed)
Mr. Allbritton called stating the Pravastatin Lori sent to [facility] was going to be 36.00. I called [facility] and they do not have $4.00 plan. Spoke w/ pt and he wants to send it to [facility] on [address]. Sent.

## 2010-10-12 NOTE — Telephone Encounter (Signed)
Pt called said that [facility] wanted to charge him 36.00 for zocor He said he thought it was cheaper. Please call

## 2010-10-12 NOTE — Assessment & Plan Note (Addendum)
I have switched him to Pravachol. He says he can afford $4.00 meds. We will check a CMET and lipid on return.

## 2010-10-12 NOTE — Assessment & Plan Note (Signed)
He is doing well. I encouraged him to stop smoking. I have given him samples of Effient. He has noted no dark stools at home. We will check a CBC today. I would like to try and keep him on the Effient a little longer if possible. He is agreeable.

## 2010-11-12 ENCOUNTER — Encounter: Payer: Self-pay | Admitting: Nurse Practitioner

## 2010-11-12 ENCOUNTER — Ambulatory Visit (INDEPENDENT_AMBULATORY_CARE_PROVIDER_SITE_OTHER): Payer: Self-pay | Admitting: Nurse Practitioner

## 2010-11-12 VITALS — BP 130/90 | HR 74 | Ht 75.5 in | Wt 227.6 lb

## 2010-11-12 DIAGNOSIS — I2510 Atherosclerotic heart disease of native coronary artery without angina pectoris: Secondary | ICD-10-CM

## 2010-11-12 DIAGNOSIS — F17200 Nicotine dependence, unspecified, uncomplicated: Secondary | ICD-10-CM

## 2010-11-12 DIAGNOSIS — Z720 Tobacco use: Secondary | ICD-10-CM

## 2010-11-12 DIAGNOSIS — E785 Hyperlipidemia, unspecified: Secondary | ICD-10-CM

## 2010-11-12 LAB — HEPATIC FUNCTION PANEL
ALT: 24 U/L (ref 0–53)
AST: 21 U/L (ref 0–37)
Albumin: 4.7 g/dL (ref 3.5–5.2)
Alkaline Phosphatase: 50 U/L (ref 39–117)
Bilirubin, Direct: 0.1 mg/dL (ref 0.0–0.3)
Total Bilirubin: 0.5 mg/dL (ref 0.3–1.2)
Total Protein: 7.9 g/dL (ref 6.0–8.3)

## 2010-11-12 LAB — LDL CHOLESTEROL, DIRECT: Direct LDL: 142.3 mg/dL

## 2010-11-12 LAB — BASIC METABOLIC PANEL
BUN: 13 mg/dL (ref 6–23)
CO2: 26 mEq/L (ref 19–32)
Calcium: 9.5 mg/dL (ref 8.4–10.5)
Chloride: 106 mEq/L (ref 96–112)
Creatinine, Ser: 0.8 mg/dL (ref 0.4–1.5)
GFR: 113.33 mL/min (ref >60.00)
Glucose, Bld: 182 mg/dL — ABNORMAL HIGH (ref 70–99)
Potassium: 4.6 mEq/L (ref 3.5–5.1)
Sodium: 138 mEq/L (ref 135–145)

## 2010-11-12 LAB — LIPID PANEL
Cholesterol: 268 mg/dL — ABNORMAL HIGH (ref 0–200)
HDL: 35.4 mg/dL — ABNORMAL LOW (ref >39.00)
Total CHOL/HDL Ratio: 8
Triglycerides: 511 mg/dL — ABNORMAL HIGH (ref 0.0–149.0)
VLDL: 102.2 mg/dL — ABNORMAL HIGH (ref 0.0–40.0)

## 2010-11-12 NOTE — Patient Instructions (Addendum)
I encourage you to stop smoking. Stay on your current medicines. Finish your current supply of Effient. Stay on your aspirin. Continue with exercise. We will call you with your lab results.  I will have you see Dr. Elease Hashimoto in about 3 months

## 2010-11-12 NOTE — Assessment & Plan Note (Signed)
Labs will be checked today. 

## 2010-11-12 NOTE — Progress Notes (Signed)
    [name] Newton Date of Birth: February 11, 1961   History of Present Illness: [name] Newton is seen back today for a one month visit. He is seen for Dr. Elease Hashimoto today. Overall, he is doing well. No chest pain. He did have a "little twinge" this morning, nothing like his prior chest pain syndrome. He is still smoking some and I counseled him in that regards. He is walking every day. He is on his last bottle of Effient. He cannot afford for long term use. He has a bare metal stent in place since April. Lipids will be checked today.   Current Outpatient Prescriptions on File Prior to Visit  Medication Sig Dispense Refill  . aspirin 81 MG tablet Take 81 mg by mouth daily. 2 daily      . glipiZIDE (GLUCOTROL) 10 MG tablet Take 10 mg by mouth daily.        . metFORMIN (GLUCOPHAGE) 1000 MG tablet Take 1,000 mg by mouth 2 (two) times daily with a meal.        . nitroGLYCERIN (NITROSTAT) 0.4 MG SL tablet Place 0.4 mg under the tongue every 5 (five) minutes as needed.        . prasugrel (EFFIENT) 10 MG TABS Take 10 mg by mouth daily.        Marland Kitchen DISCONTD: pravastatin (PRAVACHOL) 40 MG tablet Take 1 tablet (40 mg total) by mouth every evening.  30 tablet  11    Allergies  Allergen Reactions  . Codeine     Past Medical History  Diagnosis Date  . Diabetes mellitus   . Tobacco abuse   . Hyperlipidemia   . Hypertension   . NSTEMI (non-ST elevated myocardial infarction) April 2012    status post successful PCI and bare-metal  stenting of the first diagonal this admission.   . Guaiac positive stools      Guaiac positive stool with stable H and H.     Past Surgical History  Procedure Date  . Cardiac catheterization     Normal EF. 90% diagonal lesion  . Umbilical hernia repair   . Coronary stent placement April 2012    Diagonal lesion. Bare metal    History  Smoking status  . Current Everyday Smoker -- 4.0 packs/day  . Types: Cigarettes  Smokeless tobacco  . Not on file    History    Alcohol Use No    Family History  Problem Relation Age of Onset  . Heart attack Father   . Coronary artery disease Brother     Review of Systems: The review of systems is as above. He did just smoke a cigarette before coming into the office for his appointment.  All other systems were reviewed and are negative.  Physical Exam: BP 122/90  Pulse 74  Ht 6' 3.5" (1.918 m)  Wt 227 lb 9.6 oz (103.239 kg)  BMI 28.07 kg/m2 Patient is pleasant and in no acute distress. He smells of tobacco.  Skin is warm and dry. Color is normal.  HEENT is unremarkable except for poor dentition. Normocephalic/atraumatic. PERRL. Sclera are nonicteric. Neck is supple. No masses. No JVD. Lungs are clear. Cardiac exam shows a regular rate and rhythm. Abdomen is soft. Extremities are without edema. Gait and ROM are intact. No gross neurologic deficits noted.  LABORATORY DATA: Pending   Assessment / Plan:

## 2010-11-12 NOTE — Progress Notes (Signed)
Patient called with lab results/msg left, need to call back office number left to call on monday. Alfonso Ramus RN

## 2010-11-12 NOTE — Assessment & Plan Note (Addendum)
He is doing well from a cardiac standpoint. I have left him on his current medicines. He will finish out his current supply of Effient. While we would like for him to be on this for a year following his MI, this will not be possible due to cost. He does have a BMS in place.  He will remain on aspirin. I will have him see Dr. Elease Hashimoto in about 3 months for follow up. Patient is agreeable to this plan and will call if any problems develop in the interim.

## 2010-11-12 NOTE — Assessment & Plan Note (Signed)
He is counseled in tobacco cessation.

## 2010-11-15 ENCOUNTER — Telehealth: Payer: Self-pay | Admitting: *Deleted

## 2010-11-15 NOTE — Telephone Encounter (Signed)
Chol is still high.  Increase Pravachol to 80 mg daily.  Recheck lipids and bmp, hfp in 3 months.

## 2010-11-15 NOTE — Telephone Encounter (Signed)
Pt called back re med management, pt taking pravochol daily, do you want to add or change his meds? Please advise. Pt told he needs to walk 2 miles at least 3xweek, pt agreed to do it. Alfonso Ramus RN

## 2010-11-16 ENCOUNTER — Telehealth: Payer: Self-pay | Admitting: *Deleted

## 2010-11-16 ENCOUNTER — Telehealth: Payer: Self-pay | Admitting: Nurse Practitioner

## 2010-11-16 DIAGNOSIS — E785 Hyperlipidemia, unspecified: Secondary | ICD-10-CM

## 2010-11-16 MED ORDER — PRAVASTATIN SODIUM 80 MG PO TABS: 80.0000 mg | ORAL_TABLET | Freq: Every evening | ORAL | Status: DC

## 2010-11-16 NOTE — Telephone Encounter (Signed)
Pt called/msg left to double up on his pravochol 40mg /2tabs po at hs. Pt told to call office and schedule fasting labs for mid 01/2011. Sent in new script to pharmacy.Alfonso Ramus RN

## 2010-11-16 NOTE — Telephone Encounter (Signed)
Patient called with lab results. Pt verbalized understanding. Jodette Ianmichael Amescua RN  

## 2010-11-22 ENCOUNTER — Other Ambulatory Visit: Payer: Self-pay | Admitting: *Deleted

## 2010-11-22 ENCOUNTER — Telehealth: Payer: Self-pay | Admitting: Cardiovascular Disease

## 2010-11-22 MED ORDER — PRAVASTATIN SODIUM 40 MG PO TABS: ORAL_TABLET | ORAL | Status: DC

## 2010-11-22 NOTE — Telephone Encounter (Signed)
Wants to know why his medication that was just called into [facility] [address] was so expensive this time.  He thinks that it may be something different that Lawson Fiscal called in. Pt wants to speak with Lawson Fiscal or Hospital doctor.

## 2010-11-22 NOTE — Telephone Encounter (Signed)
Cost difference was substantial betweenpravochol 40mg  bid(8.00 a month) and 80mg  qd (49 a month0

## 2010-12-06 ENCOUNTER — Telehealth: Payer: Self-pay | Admitting: *Deleted

## 2010-12-06 NOTE — Telephone Encounter (Signed)
Received paperwork for med program for pt to get effient from manufacture, filled out and placed on dr desk.Alfonso Ramus RN

## 2010-12-23 ENCOUNTER — Telehealth: Payer: Self-pay | Admitting: *Deleted

## 2010-12-23 NOTE — Telephone Encounter (Signed)
msg left that his effient was here and ready to pick up.

## 2011-02-14 ENCOUNTER — Encounter: Payer: Self-pay | Admitting: Cardiovascular Disease

## 2011-02-14 ENCOUNTER — Ambulatory Visit (INDEPENDENT_AMBULATORY_CARE_PROVIDER_SITE_OTHER): Payer: Self-pay | Admitting: Cardiovascular Disease

## 2011-02-14 DIAGNOSIS — I10 Essential (primary) hypertension: Secondary | ICD-10-CM

## 2011-02-14 DIAGNOSIS — M79609 Pain in unspecified limb: Secondary | ICD-10-CM

## 2011-02-14 DIAGNOSIS — F17200 Nicotine dependence, unspecified, uncomplicated: Secondary | ICD-10-CM

## 2011-02-14 DIAGNOSIS — Z720 Tobacco use: Secondary | ICD-10-CM

## 2011-02-14 DIAGNOSIS — M79606 Pain in leg, unspecified: Secondary | ICD-10-CM | POA: Insufficient documentation

## 2011-02-14 DIAGNOSIS — I2510 Atherosclerotic heart disease of native coronary artery without angina pectoris: Secondary | ICD-10-CM

## 2011-02-14 NOTE — Assessment & Plan Note (Signed)
He's doing quite well from a cardiac standpoint.  He'll be able to stop his Effient in November. He is to continue an aspirin a day.  We'll see him back in the office in 6 months. We'll check a lipid profile, basic metabolic profile and hepatic profile at that time.

## 2011-02-14 NOTE — Progress Notes (Signed)
[name] Newton Date of Birth  1960/10/15 [facility] [address]     [address] [place], [place]  [address] [phone]  Fax  [phone]  History of Present Illness:  50 year old gentleman with a history of coronary artery disease. Status post PTCA and stenting of his first diagonal artery in May, 2012.   He's done well. He has not had any episodes of chest pain.    He has not gone back to work.  Complains of some numbness and tingling in his right leg. The tingling seems to start in his right hip  and goes down his right leg.  He complains of some right leg pain with walking.  Current Outpatient Prescriptions on File Prior to Visit  Medication Sig Dispense Refill  . aspirin 81 MG tablet Take 81 mg by mouth daily. 2 daily      . glipiZIDE (GLUCOTROL) 10 MG tablet Take 10 mg by mouth daily.        . metFORMIN (GLUCOPHAGE) 1000 MG tablet Take 1,000 mg by mouth 2 (two) times daily with a meal.        . nitroGLYCERIN (NITROSTAT) 0.4 MG SL tablet Place 0.4 mg under the tongue every 5 (five) minutes as needed.        . prasugrel (EFFIENT) 10 MG TABS Take 10 mg by mouth daily.        . pravastatin (PRAVACHOL) 40 MG tablet 2 tablets po daily (80mg )  60 tablet  3    Allergies  Allergen Reactions  . Codeine     Past Medical History  Diagnosis Date  . Diabetes mellitus   . Tobacco abuse   . Hyperlipidemia   . Hypertension   . NSTEMI (non-ST elevated myocardial infarction) April 2012    status post successful PCI and bare-metal  stenting of the first diagonal this admission.   . Guaiac positive stools      Guaiac positive stool with stable H and H.     Past Surgical History  Procedure Date  . Cardiac catheterization     Normal EF. 90% diagonal lesion  . Umbilical hernia repair   . Coronary stent placement April 2012    Diagonal lesion. Bare metal    History  Smoking status  . Current Everyday Smoker -- 4.0 packs/day  .  Types: Cigarettes  Smokeless tobacco  . Not on file    History  Alcohol Use No    Family History  Problem Relation Age of Onset  . Heart attack Father   . Coronary artery disease Brother     Reviw of Systems:  Reviewed in the HPI.  All other systems are negative.  Physical Exam: BP 118/86  Pulse 68  Ht 6\' 4"  (1.93 m)  Wt 228 lb (103.42 kg)  BMI 27.75 kg/m2 The patient is alert and oriented x 3.  The mood and affect are normal.   Skin: warm and dry.  Color is normal.    HEENT:   the sclera are nonicteric.  The mucous membranes are moist.  The carotids are 2+ without bruits.  There is no thyromegaly.  There is no JVD.    Lungs: clear.  The chest wall is non tender.    Heart: regular rate with a normal S1 and S2.  There are no murmurs, gallops, or rubs. The PMI is not displaced.     Abdomen: good bowel sounds.  There is no guarding or rebound.  There  is no hepatosplenomegaly or tenderness.  There are no masses.   Extremities:  no clubbing, cyanosis, or edema.  The legs are without rashes.  The distal pulses are intact.   Neuro:  Cranial nerves II - XII are intact.  Motor and sensory functions are intact.    The gait is normal.  Assessment / Plan:

## 2011-02-14 NOTE — Assessment & Plan Note (Addendum)
He complains of some numbness and tingling in his right leg. His distal leg pulses are excellent and so do not think that it is a vascular problem. I think that he may have some lower spine issues. He is to followup with his doctor at [facility].

## 2011-02-14 NOTE — Assessment & Plan Note (Signed)
I have  encouraged him to stop smoking. He has cut his cigarette smoking down quite a bit. I told him that it would greatly decreasing the risk of him having further coronary artery disease.

## 2011-02-14 NOTE — Assessment & Plan Note (Signed)
Stable

## 2011-06-08 ENCOUNTER — Telehealth: Payer: Self-pay | Admitting: *Deleted

## 2011-06-08 ENCOUNTER — Telehealth: Payer: Self-pay | Admitting: Cardiovascular Disease

## 2011-06-08 NOTE — Telephone Encounter (Signed)
Received vocational rehab paperwork, will fill out for cardiac stand point/ done and faxed, told pt he needs to call back and set a day to come to lab for fasting lab work, orders in system.

## 2011-06-08 NOTE — Telephone Encounter (Signed)
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

## 2011-06-09 ENCOUNTER — Telehealth: Payer: Self-pay | Admitting: Cardiovascular Disease

## 2011-06-09 NOTE — Telephone Encounter (Signed)
Spoke with Fatima Sanger from Vocational rehabilitation services regarding letter send on pt  to see it any functional limitations from the cardiac stand point. French Ana was made aware that papers were filled out and faxed back to their office yesterday 06/08/11, and patient is aware. French Ana will call back if any questions.

## 2011-06-09 NOTE — Telephone Encounter (Signed)
[facility] Call: Calling wanting to discuss with nurse about completing paperwork stating what pt can do in regards to work. Please return pt counselor call.

## 2011-06-10 ENCOUNTER — Other Ambulatory Visit: Payer: Self-pay | Admitting: Cardiovascular Disease

## 2011-06-10 ENCOUNTER — Other Ambulatory Visit (INDEPENDENT_AMBULATORY_CARE_PROVIDER_SITE_OTHER): Payer: Self-pay | Admitting: *Deleted

## 2011-06-10 DIAGNOSIS — I2510 Atherosclerotic heart disease of native coronary artery without angina pectoris: Secondary | ICD-10-CM

## 2011-06-10 LAB — BASIC METABOLIC PANEL
BUN: 18 mg/dL (ref 6–23)
CO2: 20 mEq/L (ref 19–32)
Calcium: 9 mg/dL (ref 8.4–10.5)
Chloride: 104 mEq/L (ref 96–112)
Creatinine, Ser: 0.9 mg/dL (ref 0.4–1.5)
GFR: 89.82 mL/min (ref >60.00)
Glucose, Bld: 214 mg/dL — ABNORMAL HIGH (ref 70–99)
Potassium: 4.1 mEq/L (ref 3.5–5.1)
Sodium: 135 mEq/L (ref 135–145)

## 2011-06-10 LAB — LIPID PANEL
Cholesterol: 303 mg/dL — ABNORMAL HIGH (ref 0–200)
HDL: 34.7 mg/dL — ABNORMAL LOW (ref >39.00)
Total CHOL/HDL Ratio: 9
Triglycerides: 394 mg/dL — ABNORMAL HIGH (ref 0.0–149.0)
VLDL: 78.8 mg/dL — ABNORMAL HIGH (ref 0.0–40.0)

## 2011-06-10 LAB — HEPATIC FUNCTION PANEL
ALT: 27 U/L (ref 0–53)
AST: 23 U/L (ref 0–37)
Albumin: 4.2 g/dL (ref 3.5–5.2)
Alkaline Phosphatase: 47 U/L (ref 39–117)
Bilirubin, Direct: 0 mg/dL (ref 0.0–0.3)
Total Bilirubin: 0.7 mg/dL (ref 0.3–1.2)
Total Protein: 7.4 g/dL (ref 6.0–8.3)

## 2011-06-10 LAB — LDL CHOLESTEROL, DIRECT: Direct LDL: 174.8 mg/dL

## 2011-06-13 ENCOUNTER — Telehealth: Payer: Self-pay | Admitting: *Deleted

## 2011-06-13 MED ORDER — ATORVASTATIN CALCIUM 80 MG PO TABS: 80.0000 mg | ORAL_TABLET | Freq: Every day | ORAL | Status: DC

## 2011-06-13 NOTE — Telephone Encounter (Signed)
Message copied by Antony Odea on Mon Jun 13, 2011  9:20 AM ------      Message from: [place], [place] J      Created: Sun Jun 12, 2011  9:19 PM       The pravastatin is not strong enough.  DC pravachol and start lipator 80 mg a day.  He needs to decrease his dietary intake of fats and to stop smoking.

## 2011-06-13 NOTE — Telephone Encounter (Signed)
GAVE RESULTS TO PT HE HAS NOT BEEN ABLE TO AFFORD CHOLESTEROL MEDS. NEW SCRIPT CALLED TO HEALTH SERVE, TOLD HIM HE HAS TO HAVE LABS DRAWN 6 WEEKS AFTER STARTING NEW MED, PT VERBALIZED UNDERSTANDING.

## 2011-08-17 ENCOUNTER — Ambulatory Visit: Payer: Self-pay | Admitting: Cardiovascular Disease

## 2011-09-07 ENCOUNTER — Ambulatory Visit (INDEPENDENT_AMBULATORY_CARE_PROVIDER_SITE_OTHER): Payer: Self-pay | Admitting: Cardiovascular Disease

## 2011-09-07 ENCOUNTER — Encounter: Payer: Self-pay | Admitting: Cardiovascular Disease

## 2011-09-07 VITALS — BP 108/71 | HR 80 | Ht 76.0 in | Wt 224.1 lb

## 2011-09-07 DIAGNOSIS — I10 Essential (primary) hypertension: Secondary | ICD-10-CM

## 2011-09-07 DIAGNOSIS — E785 Hyperlipidemia, unspecified: Secondary | ICD-10-CM

## 2011-09-07 DIAGNOSIS — I2510 Atherosclerotic heart disease of native coronary artery without angina pectoris: Secondary | ICD-10-CM

## 2011-09-07 NOTE — Assessment & Plan Note (Addendum)
[name] Newton is doing fairly well. He's not having episodes of angina. He did describe some vague musculoskeletal chest pain. He described as a pinching-like sensation. It occurred one time when he was lifting weights while leaning over a support beam. He's not had any previous episodes of pain and has not had any episodes of pain since.  I do not think this represents angina. Also don't think that this represents a muscle aches due to the Lipitor.  We'll continue with the same medications. wre'll  check fasting labs later this week or next week. I'll see him in 6 months for fasting labs and an office visit.

## 2011-09-07 NOTE — Progress Notes (Signed)
[name] Newton Date of Birth  08/09/1960 [facility] [address]     [address] [place], [place]  [address] [phone]  Fax  [phone]   Problems: 1. Coronary artery disease-PTCA and stenting of his first diagonal artery-May, 2012 2. Hyperlipidemia 3. Leg pain 4. Diabetes mellitus  History of Present Illness:  51 year old gentleman with a history of coronary artery disease. Status post PTCA and stenting of his first diagonal artery in May, 2012.   He's done well. He has not had any episodes of chest pain.    He has not gone back to work. Complains of some numbness and tingling in his right leg. The tingling seems to start in his right hip  and goes down his right leg.  He complains of some right leg pain with walking.  He describes 1 episode of chest pinching that occurred when he was lifting weights. He was in a rather awkward position and lifting weights while leaning on a hard support beam.  He's not had any episodes of muscle ache that sounds like it is due to statin. He also has not had any angina-like chest pain.  Current Outpatient Prescriptions on File Prior to Visit  Medication Sig Dispense Refill  . aspirin 81 MG tablet Take 81 mg by mouth daily. 2 daily      . atorvastatin (LIPITOR) 80 MG tablet Take 1 tablet (80 mg total) by mouth daily.  90 tablet  1  . glipiZIDE (GLUCOTROL) 10 MG tablet Take 10 mg by mouth daily.        . metFORMIN (GLUCOPHAGE) 1000 MG tablet Take 1,000 mg by mouth 2 (two) times daily with a meal.        . nitroGLYCERIN (NITROSTAT) 0.4 MG SL tablet Place 0.4 mg under the tongue every 5 (five) minutes as needed.          Allergies  Allergen Reactions  . Codeine     Past Medical History  Diagnosis Date  . Diabetes mellitus   . Tobacco abuse   . Hyperlipidemia   . Hypertension   . NSTEMI (non-ST elevated myocardial infarction) April 2012    status post successful PCI and bare-metal   stenting of the first diagonal this admission.   . Guaiac positive stools      Guaiac positive stool with stable H and H.     Past Surgical History  Procedure Date  . Cardiac catheterization     Normal EF. 90% diagonal lesion  . Umbilical hernia repair   . Coronary stent placement April 2012    Diagonal lesion. Bare metal    History  Smoking status  . Current Everyday Smoker -- 0.5 packs/day  . Types: Cigarettes  Smokeless tobacco  . Not on file    History  Alcohol Use No    Family History  Problem Relation Age of Onset  . Heart attack Father   . Coronary artery disease Brother     Reviw of Systems:  Reviewed in the HPI.  All other systems are negative.  Physical Exam: BP 108/71  Pulse 80  Ht 6\' 4"  (1.93 m)  Wt 224 lb 1.9 oz (101.66 kg)  BMI 27.28 kg/m2 The patient is alert and oriented x 3.  The mood and affect are normal.   Skin: warm and dry.  Color is normal.    HEENT:   the sclera are nonicteric.  The mucous membranes are moist.  The carotids are  2+ without bruits.  There is no thyromegaly.  There is no JVD.    Lungs: clear.  The chest wall is non tender.    Heart: regular rate with a normal S1 and S2.  There are no murmurs, gallops, or rubs. The PMI is not displaced.     Abdomen: good bowel sounds.  There is no guarding or rebound.  There is no hepatosplenomegaly or tenderness.  There are no masses.   Extremities:  no clubbing, cyanosis, or edema.  The legs are without rashes.  The distal pulses are intact.   Neuro:  Cranial nerves II - XII are intact.  Motor and sensory functions are intact.    The gait is normal.  EKG: 09/07/2011-normal sinus rhythm. Old inferior wall myocardial infarction. EKG is paced unchanged from previous tracings. Assessment / Plan:

## 2011-09-07 NOTE — Patient Instructions (Addendum)
Your physician wants you to follow-up in: 6 months with Dr. Elease Hashimoto. You will receive a reminder letter in the mail two months in advance. If you don't receive a letter, please call our office to schedule the follow-up appointment.  Your physician recommends that you return for lab work this week or next week for fasting BMET. Hepatic panel, and Lipids.  These are to be repeated in 6 months.  Your physician recommends that you continue on your current medications as directed. Please refer to the Current Medication list given to you today.

## 2012-01-14 ENCOUNTER — Encounter (HOSPITAL_COMMUNITY): Payer: Self-pay | Admitting: *Deleted

## 2012-01-14 ENCOUNTER — Emergency Department (HOSPITAL_COMMUNITY)
Admission: EM | Admit: 2012-01-14 | Discharge: 2012-01-14 | Disposition: A | Discharge status: Home / Self Care | Payer: Self-pay | Attending: Emergency Medicine | Admitting: Emergency Medicine

## 2012-01-14 DIAGNOSIS — E119 Type 2 diabetes mellitus without complications: Secondary | ICD-10-CM | POA: Insufficient documentation

## 2012-01-14 DIAGNOSIS — Z885 Allergy status to narcotic agent status: Secondary | ICD-10-CM | POA: Insufficient documentation

## 2012-01-14 DIAGNOSIS — F17200 Nicotine dependence, unspecified, uncomplicated: Secondary | ICD-10-CM | POA: Insufficient documentation

## 2012-01-14 DIAGNOSIS — L97509 Non-pressure chronic ulcer of other part of unspecified foot with unspecified severity: Secondary | ICD-10-CM | POA: Insufficient documentation

## 2012-01-14 DIAGNOSIS — L089 Local infection of the skin and subcutaneous tissue, unspecified: Secondary | ICD-10-CM

## 2012-01-14 LAB — GLUCOSE, CAPILLARY: Glucose-Capillary: 309 mg/dL — ABNORMAL HIGH (ref 70–99)

## 2012-01-14 MED ORDER — METFORMIN HCL 500 MG PO TABS: ORAL_TABLET | ORAL | Status: DC

## 2012-01-14 MED ORDER — SULFAMETHOXAZOLE-TRIMETHOPRIM 800-160 MG PO TABS: 1.0000 | ORAL_TABLET | Freq: Two times a day (BID) | ORAL | Status: AC

## 2012-01-14 NOTE — ED Notes (Signed)
Pt reports having wounds to bilateral feet for extended amount of time, pt is diabetic, cbg 300 today.

## 2012-01-14 NOTE — ED Provider Notes (Signed)
History  This chart was scribed for Benny Lennert, MD by Ladona Ridgel Day. This patient was seen in room TR09C/TR09C and the patient's care was started at 1010.   CSN: [id number]  Arrival date & time 01/14/12  1010   First MD Initiated Contact with Patient 01/14/12 1150      Chief Complaint  Patient presents with  . Foot Pain   Patient is a 51 y.o. male presenting with lower extremity pain. The history is provided by the patient. No language interpreter was used.  Foot Pain This is a new problem. The current episode started more than 2 days ago. The problem occurs constantly. The problem has not changed since onset.Pertinent negatives include no chest pain, no abdominal pain and no headaches. Nothing aggravates the symptoms. Nothing relieves the symptoms. He has tried nothing for the symptoms.   [name] Newton is a 52 y.o. male who presents to the Emergency Department with history of DM complaining of painful bilateral foot ulcers for one week. He states that he has been off of his DM medication for a month because he does cannot afford them.    Past Medical History  Diagnosis Date  . Diabetes mellitus   . Tobacco abuse   . Hyperlipidemia   . Hypertension   . NSTEMI (non-ST elevated myocardial infarction) April 2012    status post successful PCI and bare-metal  stenting of the first diagonal this admission.   . Guaiac positive stools      Guaiac positive stool with stable H and H.     Past Surgical History  Procedure Date  . Cardiac catheterization     Normal EF. 90% diagonal lesion  . Umbilical hernia repair   . Coronary stent placement April 2012    Diagonal lesion. Bare metal    Family History  Problem Relation Age of Onset  . Heart attack Father   . Coronary artery disease Brother     History  Substance Use Topics  . Smoking status: Current Everyday Smoker -- 0.5 packs/day    Types: Cigarettes  . Smokeless tobacco: Not on file  . Alcohol Use: No       Review of Systems  Constitutional: Negative for fatigue.  HENT: Negative for congestion, sinus pressure and ear discharge.   Eyes: Negative for discharge.  Respiratory: Negative for cough.   Cardiovascular: Negative for chest pain.  Gastrointestinal: Negative for abdominal pain and diarrhea.  Genitourinary: Negative for frequency and hematuria.  Musculoskeletal: Negative for back pain.  Skin: Positive for wound (Bilateral foot ulcers. ). Negative for rash.  Neurological: Negative for seizures and headaches.  Hematological: Negative.   Psychiatric/Behavioral: Negative for hallucinations.  All other systems reviewed and are negative.    Allergies  Codeine  Home Medications   Current Outpatient Rx  Name Route Sig Dispense Refill  . ASPIRIN 81 MG PO TABS Oral Take 81 mg by mouth daily as needed.       Triage Vitals: BP 127/99  Pulse 75  Temp 97.7 F (36.5 C) (Oral)  Resp 20  SpO2 100%  Physical Exam  Nursing note and vitals reviewed. Constitutional: He is oriented to person, place, and time. He appears well-developed.  HENT:  Head: Normocephalic.  Eyes: Conjunctivae are normal.  Neck: No tracheal deviation present.  Cardiovascular:  No murmur heard. Musculoskeletal: Normal range of motion.  Neurological: He is oriented to person, place, and time.  Skin: Skin is warm.       Skin ulcers  on his foot and ankle. No signs of infection.    Psychiatric: He has a normal mood and affect.    ED Course  Procedures (including critical care time) DIAGNOSTIC STUDIES: Oxygen Saturation is 100% on room air, normal by my interpretation.    COORDINATION OF CARE: At 1157 AM Discussed treatment plan with patient which includes antibiotics and referral to social worker. Patient agrees.   Labs Reviewed  GLUCOSE, CAPILLARY - Abnormal; Notable for the following:    Glucose-Capillary 309 (*)     All other components within normal limits   No results found.   No diagnosis  found.    MDM  The chart was scribed for me under my direct supervision.  I personally performed the history, physical, and medical decision making and all procedures in the evaluation of this patient.Benny Lennert, MD 01/14/12 [phone]

## 2012-01-14 NOTE — ED Notes (Signed)
Have paged case manager in regards to pt inability to afford meds, pt updated

## 2012-03-06 ENCOUNTER — Ambulatory Visit (INDEPENDENT_AMBULATORY_CARE_PROVIDER_SITE_OTHER): Payer: Self-pay | Admitting: Cardiovascular Disease

## 2012-03-06 ENCOUNTER — Encounter: Payer: Self-pay | Admitting: Cardiovascular Disease

## 2012-03-06 ENCOUNTER — Other Ambulatory Visit (INDEPENDENT_AMBULATORY_CARE_PROVIDER_SITE_OTHER): Payer: Self-pay

## 2012-03-06 VITALS — BP 118/78 | HR 73 | Ht 76.0 in | Wt 219.0 lb

## 2012-03-06 DIAGNOSIS — I2510 Atherosclerotic heart disease of native coronary artery without angina pectoris: Secondary | ICD-10-CM

## 2012-03-06 DIAGNOSIS — E785 Hyperlipidemia, unspecified: Secondary | ICD-10-CM

## 2012-03-06 DIAGNOSIS — I10 Essential (primary) hypertension: Secondary | ICD-10-CM

## 2012-03-06 LAB — LIPID PANEL
Cholesterol: 333 mg/dL — ABNORMAL HIGH (ref 0–200)
HDL: 32.3 mg/dL — ABNORMAL LOW (ref >39.00)
Total CHOL/HDL Ratio: 10
Triglycerides: 588 mg/dL — ABNORMAL HIGH (ref 0.0–149.0)
VLDL: 117.6 mg/dL — ABNORMAL HIGH (ref 0.0–40.0)

## 2012-03-06 LAB — LDL CHOLESTEROL, DIRECT: Direct LDL: 152.7 mg/dL

## 2012-03-06 LAB — BASIC METABOLIC PANEL
BUN: 14 mg/dL (ref 6–23)
CO2: 25 mEq/L (ref 19–32)
Calcium: 9 mg/dL (ref 8.4–10.5)
Chloride: 100 mEq/L (ref 96–112)
Creatinine, Ser: 0.9 mg/dL (ref 0.4–1.5)
GFR: 89.56 mL/min (ref >60.00)
Glucose, Bld: 308 mg/dL — ABNORMAL HIGH (ref 70–99)
Potassium: 4.4 mEq/L (ref 3.5–5.1)
Sodium: 133 mEq/L — ABNORMAL LOW (ref 135–145)

## 2012-03-06 LAB — HEPATIC FUNCTION PANEL
ALT: 23 U/L (ref 0–53)
AST: 21 U/L (ref 0–37)
Albumin: 4 g/dL (ref 3.5–5.2)
Alkaline Phosphatase: 51 U/L (ref 39–117)
Bilirubin, Direct: 0.1 mg/dL (ref 0.0–0.3)
Total Bilirubin: 0.8 mg/dL (ref 0.3–1.2)
Total Protein: 7.6 g/dL (ref 6.0–8.3)

## 2012-03-06 MED ORDER — SIMVASTATIN 40 MG PO TABS: 40.0000 mg | ORAL_TABLET | Freq: Every day | ORAL | Status: DC

## 2012-03-06 NOTE — Patient Instructions (Signed)
Your physician has recommended you make the following change in your medication:   Start simvastatin 40 mg each evening this is for cholesterol  Your physician recommends that you return for lab work in: today and in 3 months see app date you may walk in on that day 8:30 -4 pm fasting you may have water  Your physician wants you to follow-up in: 6 months You will receive a reminder letter in the mail two months in advance. If you don't receive a letter, please call our office to schedule the follow-up appointment.

## 2012-03-06 NOTE — Progress Notes (Signed)
[name] Newton Date of Birth  1961-03-21 [facility] [address]     [address] [place], [place]  [address] [phone]  Fax  [phone]   Problems: 1. Coronary artery disease-PTCA and stenting of his first diagonal artery-May, 2012 2. Hyperlipidemia 3. Leg pain 4. Diabetes mellitus  History of Present Illness:  51 year old gentleman with a history of coronary artery disease. Status post PTCA and stenting of his first diagonal artery in May, 2012.   He's done well. He has not had any episodes of chest pain.    He has not gone back to work. Complains of some numbness and tingling in his right leg. The tingling seems to start in his right hip  and goes down his right leg.  He complains of some right leg pain with walking.  Oct. 8, 2013 -  He is doing well.  He has not returned to work.  He has had problems with his legs - had some infections of his legs due to diabetes.   He is not having any chest pain.    Current Outpatient Prescriptions on File Prior to Visit  Medication Sig Dispense Refill  . aspirin 81 MG tablet Take 81 mg by mouth daily as needed.       . metFORMIN (GLUCOPHAGE) 500 MG tablet Take the metformin 2 times a day  60 tablet  1    Allergies  Allergen Reactions  . Codeine Nausea And Vomiting    Past Medical History  Diagnosis Date  . Diabetes mellitus   . Tobacco abuse   . Hyperlipidemia   . Hypertension   . NSTEMI (non-ST elevated myocardial infarction) April 2012    status post successful PCI and bare-metal  stenting of the first diagonal this admission.   . Guaiac positive stools      Guaiac positive stool with stable H and H.     Past Surgical History  Procedure Date  . Cardiac catheterization     Normal EF. 90% diagonal lesion  . Umbilical hernia repair   . Coronary stent placement April 2012    Diagonal lesion. Bare metal    History  Smoking status  . Current Every Day Smoker -- 0.5  packs/day  . Types: Cigarettes  Smokeless tobacco  . Not on file    History  Alcohol Use No    Family History  Problem Relation Age of Onset  . Heart attack Father   . Coronary artery disease Brother     Reviw of Systems:  Reviewed in the HPI.  All other systems are negative.  Physical Exam: BP 118/78  Pulse 73  Ht 6\' 4"  (1.93 m)  Wt 219 lb (99.338 kg)  BMI 26.66 kg/m2  SpO2 97% The patient is alert and oriented x 3.  The mood and affect are normal.   Skin: warm and dry.  Color is normal.    HEENT:   the sclera are nonicteric.  The mucous membranes are moist.  The carotids are 2+ without bruits.  There is no thyromegaly.  There is no JVD.    Lungs: clear.  The chest wall is non tender.    Heart: regular rate with a normal S1 and S2.  There are no murmurs, gallops, or rubs. The PMI is not displaced.     Abdomen: good bowel sounds.  There is no guarding or rebound.  There is no hepatosplenomegaly or tenderness.  There are no masses.  Extremities:  no clubbing, cyanosis, or edema.  He has several scars on his lower legs from skin infections.  The distal pulses are intact.   Neuro:  Cranial nerves II - XII are intact.  Motor and sensory functions are intact.    The gait is normal.  EKG:  Assessment / Plan:

## 2012-03-06 NOTE — Assessment & Plan Note (Signed)
This seems to be doing well from a cardiac standpoint. He's not having any episodes of chest pain. He has stopped his Lipitor because he ran out of medication. He has not been back to see his medical doctor. He used  to be seen at [facility] and  has not seen anyone since the clinic  closed.  I've encouraged him to look for other health clinics. We will start him on simvastatin 40 mg a day. We'll check his fasting labs today and again in 3 months and 6 months. I'll see him again in 6 months for an office visit.

## 2012-03-08 ENCOUNTER — Telehealth: Payer: Self-pay | Admitting: Cardiovascular Disease

## 2012-03-08 NOTE — Telephone Encounter (Signed)
Pt was given a new rx for simvastatin that was cheaper, but its still too expensive can he get anything else?? [phone] uses [facility] [place]

## 2012-03-08 NOTE — Telephone Encounter (Signed)
[facility], simvastatin in not on the $4.00 list, pt can not afford 54.00 for it.  Pravachol is on the $4.00 list and lovastatin 20 mg is on $4.00 list. Pt wants to know if he can have either of these, pt aware he will not hear back till 10/14. Please advise

## 2012-03-11 NOTE — Telephone Encounter (Signed)
It's disappointing that simvastatin is no longer on the $4 list.  He may try lovastatin 20 mg.  Check lipids, HFP, BMP in 3 months.

## 2012-03-12 MED ORDER — LOVASTATIN 20 MG PO TABS: 20.0000 mg | ORAL_TABLET | Freq: Every day | ORAL | Status: DC

## 2012-03-12 NOTE — Telephone Encounter (Signed)
Pt needs meds called into [facility] [address] I explained you would call to talk about new medication and when he needs to come in for lads. Pt woulds like a call today

## 2012-03-12 NOTE — Telephone Encounter (Signed)
Pt called and meds switched.

## 2012-06-05 ENCOUNTER — Other Ambulatory Visit: Payer: Self-pay

## 2012-06-07 ENCOUNTER — Other Ambulatory Visit: Payer: Self-pay | Admitting: *Deleted

## 2012-06-07 DIAGNOSIS — I2510 Atherosclerotic heart disease of native coronary artery without angina pectoris: Secondary | ICD-10-CM

## 2012-06-07 DIAGNOSIS — I10 Essential (primary) hypertension: Secondary | ICD-10-CM

## 2012-06-07 DIAGNOSIS — E785 Hyperlipidemia, unspecified: Secondary | ICD-10-CM

## 2012-06-08 ENCOUNTER — Other Ambulatory Visit: Payer: Self-pay

## 2012-06-14 ENCOUNTER — Other Ambulatory Visit: Payer: Self-pay

## 2012-06-25 ENCOUNTER — Emergency Department (HOSPITAL_COMMUNITY)
Admission: EM | Admit: 2012-06-25 | Discharge: 2012-06-25 | Discharge status: Absent without leave | Payer: Self-pay | Source: Home / Self Care

## 2012-06-25 ENCOUNTER — Other Ambulatory Visit (INDEPENDENT_AMBULATORY_CARE_PROVIDER_SITE_OTHER): Payer: Self-pay

## 2012-06-25 DIAGNOSIS — I2510 Atherosclerotic heart disease of native coronary artery without angina pectoris: Secondary | ICD-10-CM

## 2012-06-25 DIAGNOSIS — I10 Essential (primary) hypertension: Secondary | ICD-10-CM

## 2012-06-25 DIAGNOSIS — E785 Hyperlipidemia, unspecified: Secondary | ICD-10-CM

## 2012-06-25 LAB — HEPATIC FUNCTION PANEL
ALT: 28 U/L (ref 0–53)
AST: 22 U/L (ref 0–37)
Albumin: 4.2 g/dL (ref 3.5–5.2)
Alkaline Phosphatase: 48 U/L (ref 39–117)
Bilirubin, Direct: 0 mg/dL (ref 0.0–0.3)
Total Bilirubin: 0.7 mg/dL (ref 0.3–1.2)
Total Protein: 7.8 g/dL (ref 6.0–8.3)

## 2012-06-25 LAB — BASIC METABOLIC PANEL
BUN: 20 mg/dL (ref 6–23)
CO2: 26 mEq/L (ref 19–32)
Calcium: 10.3 mg/dL (ref 8.4–10.5)
Chloride: 97 mEq/L (ref 96–112)
Creatinine, Ser: 1 mg/dL (ref 0.4–1.5)
GFR: 85.25 mL/min (ref >60.00)
Glucose, Bld: 360 mg/dL — ABNORMAL HIGH (ref 70–99)
Potassium: 4.2 mEq/L (ref 3.5–5.1)
Sodium: 133 mEq/L — ABNORMAL LOW (ref 135–145)

## 2012-06-25 LAB — LIPID PANEL
Cholesterol: 309 mg/dL — ABNORMAL HIGH (ref 0–200)
HDL: 33.3 mg/dL — ABNORMAL LOW (ref >39.00)
Total CHOL/HDL Ratio: 9
Triglycerides: 548 mg/dL — ABNORMAL HIGH (ref 0.0–149.0)
VLDL: 109.6 mg/dL — ABNORMAL HIGH (ref 0.0–40.0)

## 2012-06-25 LAB — LDL CHOLESTEROL, DIRECT: Direct LDL: 135.8 mg/dL

## 2012-06-25 NOTE — ED Notes (Signed)
Called in all waiting areas with no response from this pt.

## 2012-06-25 NOTE — ED Notes (Signed)
Was asked by front staff to assess pt for SOB x2 weeks Reports that he will have occasional headaches and mild chest pains Has a heart stent and hx of asthma Smokes 0.5 pack per day Today he denies: chest pains, headache, blurry vision, edema Adv pt to notify front staff if sx change  He is alert and responsive w/no signs of acute distress.

## 2012-06-25 NOTE — ED Notes (Signed)
Pt called in all waiting areas; no answer x 1 

## 2012-06-25 NOTE — ED Notes (Signed)
Called in all waiting areas - no response from this pt. 

## 2012-07-04 ENCOUNTER — Ambulatory Visit (INDEPENDENT_AMBULATORY_CARE_PROVIDER_SITE_OTHER): Payer: No Typology Code available for payment source | Admitting: Internal Medicine

## 2012-07-04 ENCOUNTER — Encounter: Payer: Self-pay | Admitting: Internal Medicine

## 2012-07-04 ENCOUNTER — Ambulatory Visit: Payer: Self-pay

## 2012-07-04 VITALS — BP 100/71 | HR 75 | Temp 97.0°F | Wt 218.4 lb

## 2012-07-04 DIAGNOSIS — IMO0001 Reserved for inherently not codable concepts without codable children: Secondary | ICD-10-CM

## 2012-07-04 DIAGNOSIS — J4489 Other specified chronic obstructive pulmonary disease: Secondary | ICD-10-CM

## 2012-07-04 DIAGNOSIS — E119 Type 2 diabetes mellitus without complications: Secondary | ICD-10-CM

## 2012-07-04 DIAGNOSIS — E1165 Type 2 diabetes mellitus with hyperglycemia: Secondary | ICD-10-CM

## 2012-07-04 DIAGNOSIS — J449 Chronic obstructive pulmonary disease, unspecified: Secondary | ICD-10-CM | POA: Insufficient documentation

## 2012-07-04 DIAGNOSIS — I2510 Atherosclerotic heart disease of native coronary artery without angina pectoris: Secondary | ICD-10-CM

## 2012-07-04 DIAGNOSIS — IMO0002 Reserved for concepts with insufficient information to code with codable children: Secondary | ICD-10-CM

## 2012-07-04 DIAGNOSIS — E785 Hyperlipidemia, unspecified: Secondary | ICD-10-CM

## 2012-07-04 DIAGNOSIS — R195 Other fecal abnormalities: Secondary | ICD-10-CM

## 2012-07-04 DIAGNOSIS — Z79899 Other long term (current) drug therapy: Secondary | ICD-10-CM

## 2012-07-04 LAB — GLUCOSE, CAPILLARY: Glucose-Capillary: 383 mg/dL — ABNORMAL HIGH (ref 70–99)

## 2012-07-04 LAB — POCT GLYCOSYLATED HEMOGLOBIN (HGB A1C): Hemoglobin A1C: 11.4

## 2012-07-04 LAB — TSH: TSH: 1.765 u[IU]/mL (ref 0.350–4.500)

## 2012-07-04 MED ORDER — PREDNISONE 10 MG PO TABS: 10.0000 mg | ORAL_TABLET | Freq: Every day | ORAL | Status: DC

## 2012-07-04 MED ORDER — ALBUTEROL SULFATE HFA 108 (90 BASE) MCG/ACT IN AERS: 2.0000 | INHALATION_SPRAY | Freq: Four times a day (QID) | RESPIRATORY_TRACT | Status: DC | PRN

## 2012-07-04 MED ORDER — METFORMIN HCL 500 MG PO TABS: 500.0000 mg | ORAL_TABLET | Freq: Two times a day (BID) | ORAL | Status: DC

## 2012-07-04 MED ORDER — FLUTICASONE-SALMETEROL 100-50 MCG/DOSE IN AEPB: 1.0000 | INHALATION_SPRAY | Freq: Two times a day (BID) | RESPIRATORY_TRACT | Status: DC

## 2012-07-04 MED ORDER — LOVASTATIN 20 MG PO TABS: 20.0000 mg | ORAL_TABLET | Freq: Every day | ORAL | Status: DC

## 2012-07-04 NOTE — Assessment & Plan Note (Addendum)
[name] Newton has been unable to afford his medications, including her metformin. As a result, his A1c has worsened from 8.5 about 2 years ago to 11.4 today. He continues to keep a healthy diet. He has secured an orange card today and, hopefully, we'll be able to afford his medication using the medication assistance program.  Plan  - restarted metformin  - We will try to get his prescriptions to [facility], or [facility] or [facility]. At the moment he does not have any money for any of his medications. I have printed out his prescriptions.  - Have counseled him about the importance of having blood sugar controlled.  Lab Results  Component Value Date   HGBA1C 11.4 07/04/2012   HGBA1C  Value: 8.5 (NOTE)                                                                       According to the ADA Clinical Practice Recommendations for 2011, when HbA1c is used as a screening test:   >=6.5%   Diagnostic of Diabetes Mellitus           (if abnormal result  is confirmed)  5.7-6.4%   Increased risk of developing Diabetes Mellitus  References:Diagnosis and Classification of Diabetes Mellitus,Diabetes Care,2011,34(Suppl 1):S62-S69 and Standards of Medical Care in         Diabetes - 2011,Diabetes Care,2011,34  (Suppl 1):S11-S61.* 09/22/2010   HGBA1C 9.1* 12/16/2009     Assessment:  Diabetes control:  Uncontrolled   Progress toward A1C goal:    None   Comments: Medication cost is the main hinderance  Plan:  Medications:  continue current medications  Home glucose monitoring:   Frequency:     Timing:    Instruction/counseling given: reminded to bring blood glucose meter & log to each visit, discussed foot care and discussed diet  Educational resources provided: brochure  Self management tools provided:    Other plans:  Encouraged him to her into [facility] for his medications.

## 2012-07-04 NOTE — Assessment & Plan Note (Signed)
We will refer him for a colonoscopy once his acute medical problems have been addressed.

## 2012-07-04 NOTE — Patient Instructions (Addendum)
Please take you medications as prescribed Please come back to see me in 1 week. You will contacted to have a chest x ray  Chronic Obstructive Pulmonary Disease Chronic obstructive pulmonary disease (COPD) is a condition in which airflow from the lungs is restricted. The lungs can never return to normal, but there are measures you can take which will improve them and make you feel better. CAUSES   Smoking.  Exposure to secondhand smoke.  Breathing in irritants (pollution, cigarette smoke, strong smells, aerosol sprays, paint fumes).  History of lung infections. TREATMENT  Treatment focuses on making you comfortable (supportive care). Your caregiver may prescribe medications (inhaled or pills) to help improve your breathing. HOME CARE INSTRUCTIONS   If you smoke, stop smoking.  Avoid exposure to smoke, chemicals, and fumes that aggravate your breathing.  Take antibiotic medicines as directed by your caregiver.  Avoid medicines that dry up your system and slow down the elimination of secretions (antihistamines and cough syrups). This decreases respiratory capacity and may lead to infections.  Drink enough water and fluids to keep your urine clear or pale yellow. This loosens secretions.  Use humidifiers at home and at your bedside if they do not make breathing difficult.  Receive all protective vaccines your caregiver suggests, especially pneumococcal and influenza.  Use home oxygen as suggested.  Stay active. Exercise and physical activity will help maintain your ability to do things you want to do.  Eat a healthy diet. SEEK MEDICAL CARE IF:   You develop pus-like mucus (sputum).  Breathing is more labored or exercise becomes difficult to do.  You are running out of the medicine you take for your breathing. SEEK IMMEDIATE MEDICAL CARE IF:   You have a rapid heart rate.  You have agitation, confusion, tremors, or are in a stupor (family members may need to observe  this).  It becomes difficult to breathe.  You develop chest pain.  You have a fever. MAKE SURE YOU:   Understand these instructions.  Will watch your condition.  Will get help right away if you are not doing well or get worse. Document Released: 02/23/2005 Document Revised: 08/08/2011 Document Reviewed: 07/16/2010 [facility] Patient Information 2013 [place], [place].

## 2012-07-04 NOTE — Assessment & Plan Note (Signed)
He reports that his been out of his lovastatin for about 3 months. Have encouraged him to of a healthy diet, avoiding red meats, and sugars, and flat.  Plan -Refilled his lovastatin 20 mg once daily at bedtime. -After restarting the, lovastatin we'll check his lipid panel in 3 months.

## 2012-07-04 NOTE — Assessment & Plan Note (Signed)
He is asymptomatic at this time, without left-sided chest pain, even though he has shortness of breath, which I believe is related to COPD. Plan -Continue with lovastatin, aspirin -Will focus on reducing his risk of ACS by treating his diabetes. -Will consider to do an echocardiogram in the near future.

## 2012-07-04 NOTE — Assessment & Plan Note (Signed)
His history of 30 pack years, with a recent onset of cough, wheezing, and shortness of breath is concerning for COPD. Examination of his chest reveals some mild bilateral wheezing without any other significant findings. He saturating at 96% on room air. Is not in acute distress. Plan. -Restarted albuterol and Advair. -Given him much 6 days, tapered dose of prednisone. -I have ordered a chest x-ray. -Will also plan to do PFTs and an echocardiogram to evaluate his cardiac function. -I have congregated him about his smoking cessation.

## 2012-07-04 NOTE — Progress Notes (Signed)
Patient ID: [name] Newton, male   DOB: May 03, 1961, 52 y.o.   MRN: [id number]  Subjective:   Patient ID: [name] Newton male   DOB: Apr 30, 1961 52 y.o.   MRN: [id number]  HPI: [name] Newton is a 52 y.o. with past medical history of 1 diabetes, hyperlipidemia, coronary artery disease status post stent in 2012, and previous tobacco abuse. He presents to the clinic to establish care.  [name] Newton complains of shortness of breath, associated with cough, and exertional dyspnea. He also complains of right-sided chest pain with cough. He has wheezing most of the time. He denies history of fevers or chills. He reports the cough, and shortness of breath, have been ongoing over the last 3 months. Productive of white sputum, but he has seen some blood streaks a few times. The cough seems to be worsened by dust and smoke. Unfortunately, he ran out of his albuterol inhaler, which used to give him some relief.  [name] is intermittently homeless and has had challenges affording his medications, including metformin, aspirin, lovastatin, and inhalers. However, she now has the orange card with 100% discount.  He follows up with his cardiologist and his next appointment is in 3 months.      Past Medical History  Diagnosis Date  . Diabetes mellitus   . Tobacco abuse   . Hyperlipidemia   . Hypertension   . NSTEMI (non-ST elevated myocardial infarction) April 2012    status post successful PCI and bare-metal  stenting of the first diagonal this admission.   . Guaiac positive stools      Guaiac positive stool with stable H and H.    Current Outpatient Prescriptions  Medication Sig Dispense Refill  . albuterol (PROVENTIL HFA;VENTOLIN HFA) 108 (90 BASE) MCG/ACT inhaler Inhale 2 puffs into the lungs every 6 (six) hours as needed for wheezing.  1 Inhaler  2  . aspirin 81 MG tablet Take 81 mg by mouth daily as needed.       . Fluticasone-Salmeterol (ADVAIR DISKUS) 100-50  MCG/DOSE AEPB Inhale 1 puff into the lungs every 12 (twelve) hours.  14 each  0  . lovastatin (MEVACOR) 20 MG tablet Take 1 tablet (20 mg total) by mouth at bedtime.  30 tablet  0  . metFORMIN (GLUCOPHAGE) 500 MG tablet Take 1 tablet (500 mg total) by mouth 2 (two) times daily with a meal.  60 tablet  0  . predniSONE (DELTASONE) 10 MG tablet Take 1 tablet (10 mg total) by mouth daily.  21 tablet  0   Family History  Problem Relation Age of Onset  . Heart attack Father   . Coronary artery disease Brother    History   Social History  . Marital Status: Divorced    Spouse Name: N/A    Number of Children: N/A  . Years of Education: N/A   Social History Main Topics  . Smoking status: Former Smoker    Quit date: 05/03/2012  . Smokeless tobacco: None  . Alcohol Use: No  . Drug Use: No  . Sexually Active: None   Other Topics Concern  . None   Social History Narrative  . None   Review of Systems: Constitutional: Denies fever, chills, diaphoresis, appetite change and fatigue.  HEENT: Denies photophobia, eye pain, redness, hearing loss, ear pain, congestion, sore throat, rhinorrhea, sneezing, mouth sores, trouble swallowing, neck pain, neck stiffness and tinnitus.   Respiratory: Denies SOB, DOE, cough, chest tightness,  and wheezing.   Cardiovascular:  Denies left sided chest pain with exercise, palpitations and leg swelling.  however he reports some history of paroxysmal nocturnal dyspnea, and orthopnea. He denies history of syncope. Gastrointestinal: Denies nausea, vomiting, abdominal pain, diarrhea, constipation, blood in stool and abdominal distention.  Genitourinary: Denies dysuria, urgency, frequency, hematuria, flank pain and difficulty urinating. He reports history of polyuria which she associates with high blood sugar.  Musculoskeletal: Denies myalgias, back pain, joint swelling, arthralgias and gait problem. However, he reports some pain in the right shoulder and elbow.  Skin:  Denies pallor, rash and wound.  Neurological: Reports some dizziness when he stands up quickly, seizures, syncope, weakness, light-headedness, numbness and headaches.  Hematological: Denies adenopathy. Easy bruising, personal or family bleeding history  Psychiatric/Behavioral: Denies suicidal ideation, mood changes, confusion, nervousness, sleep disturbance and agitation  Objective:  Physical Exam: Filed Vitals:   07/04/12 1016  BP: 100/71  Pulse: 75  Temp: 97 F (36.1 C)  TempSrc: Oral  Weight: 218 lb 6.4 oz (99.066 kg)  SpO2: 96%   Constitutional: Vital signs reviewed.  Patient is a well-developed and well-nourished no in no acute distress and cooperative with exam. Alert and oriented x3.  Head: Normocephalic and atraumatic Ear: TM normal bilaterally Mouth: no erythema or exudates, MMM, poor dentition without evidence of gingivitis. Eyes: PERRL, EOMI, conjunctivae normal, No scleral icterus.  Neck: Supple, Trachea midline normal ROM, No JVD, mass, thyromegaly, or carotid bruit present.  Cardiovascular: RRR, S1 normal, S2 normal, no MRG, pulses symmetric and intact bilaterally Pulmonary/Chest: CTAB, with occasional mininal wheezes bilaterally, no rales, or rhonchi Abdominal: Soft. Non-tender, non-distended, bowel sounds are normal, no masses, organomegaly, or guarding present.  GU: no CVA tenderness Musculoskeletal: No joint deformities, erythema, or stiffness, ROM full and no nontender Hematology: no cervical, inginal, or axillary adenopathy.  Neurological: A&O x3, Strength is normal and symmetric bilaterally, cranial nerve II-XII are grossly intact, no focal motor deficit, sensory intact to light touch bilaterally.  Skin: Warm, dry and intact. No rash, cyanosis, or clubbing.  Psychiatric: Normal mood and affect. speech and behavior is normal. Judgment and thought content normal. Cognition and memory are normal.   Assessment & Plan:  I have discussed the assessment and plan for  the care of [name] with Dr. Dalphine Handing as detailed in the problem based charting.  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 He has never had any evaluation for COPD. He has been given inhalers and a steroid oral taper today, and refill his other medications. We hope to get him into the [facility] to reduce cost. I will see him again in one week.

## 2012-07-12 ENCOUNTER — Ambulatory Visit: Payer: No Typology Code available for payment source | Admitting: Internal Medicine

## 2012-07-18 ENCOUNTER — Ambulatory Visit (INDEPENDENT_AMBULATORY_CARE_PROVIDER_SITE_OTHER): Payer: No Typology Code available for payment source | Admitting: Internal Medicine

## 2012-07-18 ENCOUNTER — Encounter: Payer: Self-pay | Admitting: Internal Medicine

## 2012-07-18 VITALS — BP 121/81 | HR 73 | Temp 97.8°F | Ht 76.0 in | Wt 216.4 lb

## 2012-07-18 DIAGNOSIS — K760 Fatty (change of) liver, not elsewhere classified: Secondary | ICD-10-CM

## 2012-07-18 DIAGNOSIS — J449 Chronic obstructive pulmonary disease, unspecified: Secondary | ICD-10-CM

## 2012-07-18 DIAGNOSIS — Z23 Encounter for immunization: Secondary | ICD-10-CM

## 2012-07-18 DIAGNOSIS — E785 Hyperlipidemia, unspecified: Secondary | ICD-10-CM

## 2012-07-18 DIAGNOSIS — E1165 Type 2 diabetes mellitus with hyperglycemia: Secondary | ICD-10-CM

## 2012-07-18 DIAGNOSIS — I2510 Atherosclerotic heart disease of native coronary artery without angina pectoris: Secondary | ICD-10-CM

## 2012-07-18 HISTORY — DX: Fatty (change of) liver, not elsewhere classified: K76.0

## 2012-07-18 NOTE — Assessment & Plan Note (Signed)
He reports some relief with albuterol.   Plan  - provided him with more samples of ventolin and Advair Diskus  Medication Samples have been provided to the patient.  Drug name: Albuterol inhaler   Qty: 1  LOT: [id number]  Exp.Date: 06/2013  The patient has been instructed regarding the correct time, dose, and frequency of taking this medication, including desired effects and most common side effects.   Medication Samples have been provided to the patient.  Drug name: Advair Diskus 100/50  Qty: 1  Lot Number: [id number]  Exp.Date: 03/2013  The patient has been instructed regarding the correct time, dose, and frequency of taking this medication, including desired effects and most common side effects.   Dow Adolph 4:09 PM 07/18/2012

## 2012-07-18 NOTE — Patient Instructions (Signed)
Please you need to go to the Medical assistance program to fill in the paper work for you medications  I will give you some samples for the inhalers to last you two weeks Pleas talk to [place], our social work  Please come back to the clinic in about 2 weeks

## 2012-07-18 NOTE — Progress Notes (Signed)
[name] Newton, male   DOB: 01-03-1961, 52 y.o.   MRN: [id number] [name] Newton, male   DOB: 05-Jul-1960, 52 y.o.   MRN: [id number]  Subjective:   [name] Newton male   DOB: Aug 02, 1960 52 y.o.   MRN: [id number]  HPI: [name] Newton is a 52 y.o. with past medical history of 1 diabetes, hyperlipidemia, coronary artery disease status post stent in 2012, and previous tobacco abuse. He presents to the clinic for followup visit.  He was unable to get his medications. His symptoms have remained unchanged since I last saw him 2 weeks ago.  Please see the A&P for the status of the pt's chronic medical problems.   Past Medical History  Diagnosis Date  . Diabetes mellitus   . Tobacco abuse   . Hyperlipidemia   . Hypertension   . NSTEMI (non-ST elevated myocardial infarction) April 2012    status post successful PCI and bare-metal  stenting of the first diagonal this admission.   . Guaiac positive stools      Guaiac positive stool with stable H and H.    Current Outpatient Prescriptions  Medication Sig Dispense Refill  . albuterol (PROVENTIL HFA;VENTOLIN HFA) 108 (90 BASE) MCG/ACT inhaler Inhale 2 puffs into the lungs every 6 (six) hours as needed for wheezing.  1 Inhaler  2  . Fluticasone-Salmeterol (ADVAIR DISKUS) 100-50 MCG/DOSE AEPB Inhale 1 puff into the lungs every 12 (twelve) hours.  14 each  0  . aspirin 81 MG tablet Take 81 mg by mouth daily as needed.       . lovastatin (MEVACOR) 20 MG tablet Take 1 tablet (20 mg total) by mouth at bedtime.  30 tablet  0  . metFORMIN (GLUCOPHAGE) 500 MG tablet Take 1 tablet (500 mg total) by mouth 2 (two) times daily with a meal.  60 tablet  0  . predniSONE (DELTASONE) 10 MG tablet Take 1 tablet (10 mg total) by mouth daily.  21 tablet  0   No current facility-administered medications for this visit.   Family History  Problem Relation Age of Onset  . Heart attack Father   .  Coronary artery disease Brother    History   Social History  . Marital Status: Divorced    Spouse Name: N/A    Number of Children: N/A  . Years of Education: N/A   Occupational History  . none     Unemployed since 2012   Social History Main Topics  . Smoking status: Former Smoker    Quit date: 05/03/2012  . Smokeless tobacco: None  . Alcohol Use: No  . Drug Use: No  . Sexually Active: None   Other Topics Concern  . None   Social History Narrative   [name] is intermittently homeless without any income at the moment.          Review of Systems: Constitutional: Denies fever, chills, diaphoresis, appetite change and fatigue.  HEENT: Denies photophobia, eye pain, redness, hearing loss, ear pain, congestion, sore throat, rhinorrhea, sneezing, mouth sores, trouble swallowing, neck pain, neck stiffness and tinnitus.   Respiratory: Denies SOB, DOE, cough, chest tightness,  and wheezing.   Cardiovascular: Denies left sided chest pain with exercise, palpitations and leg swelling.  however he reports some history of paroxysmal nocturnal dyspnea, and orthopnea. He denies history of syncope. Gastrointestinal: Denies nausea, vomiting, abdominal pain, diarrhea, constipation, blood in stool and abdominal distention.  Genitourinary: Denies dysuria, urgency, frequency, hematuria,  flank pain and difficulty urinating. He reports history of polyuria which she associates with high blood sugar.  Musculoskeletal: Denies myalgias, back pain, joint swelling, arthralgias and gait problem. However, he reports some pain in the right shoulder and elbow.  Skin: Denies pallor, rash and wound.  Neurological: Reports some dizziness when he stands up quickly, seizures, syncope, weakness, light-headedness, numbness and headaches.  Hematological: Denies adenopathy. Easy bruising, personal or family bleeding history  Psychiatric/Behavioral: Denies suicidal ideation, mood changes, confusion, nervousness, sleep  disturbance and agitation  Objective:  Physical Exam: Filed Vitals:   07/18/12 1025  BP: 121/81  Pulse: 73  Temp: 97.8 F (36.6 C)  TempSrc: Oral  Height: 6\' 4"  (1.93 m)  Weight: 216 lb 6.4 oz (98.158 kg)  SpO2: 96%   Constitutional: Vital signs reviewed.  [name] is a well-developed and well-nourished no in no acute distress and cooperative with exam. Alert and oriented x3.  Head: Normocephalic and atraumatic Ear: TM normal bilaterally Mouth: no erythema or exudates, MMM, poor dentition without evidence of gingivitis. Eyes: PERRL, EOMI, conjunctivae normal, No scleral icterus.  Neck: Supple, Trachea midline normal ROM, No JVD, mass, thyromegaly, or carotid bruit present.  Cardiovascular: RRR, S1 normal, S2 normal, no MRG, pulses symmetric and intact bilaterally Pulmonary/Chest: CTAB, with occasional mininal wheezes bilaterally, no rales, or rhonchi Abdominal: Soft. Non-tender, non-distended, bowel sounds are normal, no masses, organomegaly, or guarding present.  GU: no CVA tenderness Musculoskeletal: No joint deformities, erythema, or stiffness, ROM full and no nontender Hematology: no cervical, inginal, or axillary adenopathy.  Neurological: A&O x3, Strength is normal and symmetric bilaterally, cranial nerve II-XII are grossly intact, no focal motor deficit, sensory intact to light touch bilaterally.  Skin: Warm, dry and intact. No rash, cyanosis, or clubbing.  Psychiatric: Normal mood and affect. speech and behavior is normal. Judgment and thought content normal. Cognition and memory are normal.   Assessment & Plan:  I have discussed the assessment and plan for the care of Mr. Klugh with Dr. Eben Burow as detailed in the problem based charting.  Unfortunately, Mr Blaze did not fill any of the prescription given two weeks ago due to cost. This visit has been focused on trying to help him with his medication and transportation issues.   I have referred Mr. Bogosian to the  social worker to help him with medications and his living situation. I have also emphasized to him that he needs to physical go the medication assistance program and complete the paperwork required for his medications. This process usually takes about 5-6 weeks. Have given him more samples of albuterol, and Advair inhaler. He is also filled in papers for transportation assistance. I'll see him again in 2 weeks.

## 2012-07-19 LAB — MICROALBUMIN / CREATININE URINE RATIO
Creatinine, Urine: 92.7 mg/dL
Microalb Creat Ratio: 12.7 mg/g (ref 0.0–30.0)
Microalb, Ur: 1.18 mg/dL (ref 0.00–1.89)

## 2012-07-23 ENCOUNTER — Telehealth: Payer: Self-pay | Admitting: Licensed Clinical Social Worker

## 2012-07-23 NOTE — Telephone Encounter (Signed)
Mr. Schueler was referred to CSW for referrals to community resources.  CSW placed called to pt.  CSW left message requesting return call. CSW provided contact hours and phone number.

## 2012-07-30 NOTE — Telephone Encounter (Signed)
CSW placed called to pt.  CSW left message requesting return call. CSW provided contact hours and phone number. 

## 2012-08-01 NOTE — Telephone Encounter (Signed)
CSW placed called to pt.  CSW left message requesting return call. CSW provided contact hours and phone number.  CSW left message stating [employer] holders can utilize [facility] and the [employer] for medications.  Medical transportation can be provided to/from [employer] with [employer], CSW will fax referral over to [facility].  CSW will send Mr. Betten letter with information on [facility] transport.  Due to the inability of CSW to reach pt by telephone, CSW requesting pt to specify what needs or resources would be beneficial, CSW can provide more information/resources.  CSW will sign off at this time.  Pt aware CSW is available to assist as needed.

## 2012-08-08 ENCOUNTER — Encounter: Payer: Self-pay | Admitting: Internal Medicine

## 2012-08-08 ENCOUNTER — Other Ambulatory Visit (INDEPENDENT_AMBULATORY_CARE_PROVIDER_SITE_OTHER): Payer: No Typology Code available for payment source

## 2012-08-08 DIAGNOSIS — Z1211 Encounter for screening for malignant neoplasm of colon: Secondary | ICD-10-CM

## 2012-08-08 LAB — POC HEMOCCULT BLD/STL (HOME/3-CARD/SCREEN)
Card #2 Fecal Occult Blod, POC: NEGATIVE
Card #3 Fecal Occult Blood, POC: NEGATIVE
Fecal Occult Blood, POC: NEGATIVE

## 2012-08-28 ENCOUNTER — Other Ambulatory Visit: Payer: Self-pay | Admitting: *Deleted

## 2012-08-28 DIAGNOSIS — E1165 Type 2 diabetes mellitus with hyperglycemia: Secondary | ICD-10-CM

## 2012-08-29 MED ORDER — METFORMIN HCL 500 MG PO TABS: 500.0000 mg | ORAL_TABLET | Freq: Two times a day (BID) | ORAL | Status: DC

## 2012-08-29 NOTE — Telephone Encounter (Signed)
Rx called in to pharmacy. 

## 2012-10-08 ENCOUNTER — Other Ambulatory Visit: Payer: Self-pay | Admitting: *Deleted

## 2012-10-08 DIAGNOSIS — E1165 Type 2 diabetes mellitus with hyperglycemia: Secondary | ICD-10-CM

## 2012-10-12 ENCOUNTER — Other Ambulatory Visit: Payer: Self-pay | Admitting: Internal Medicine

## 2012-11-06 ENCOUNTER — Ambulatory Visit (INDEPENDENT_AMBULATORY_CARE_PROVIDER_SITE_OTHER): Payer: No Typology Code available for payment source | Admitting: Internal Medicine

## 2012-11-06 ENCOUNTER — Encounter: Payer: Self-pay | Admitting: Internal Medicine

## 2012-11-06 VITALS — BP 120/82 | HR 60 | Temp 97.0°F | Ht 76.0 in | Wt 224.6 lb

## 2012-11-06 DIAGNOSIS — E1149 Type 2 diabetes mellitus with other diabetic neurological complication: Secondary | ICD-10-CM

## 2012-11-06 DIAGNOSIS — H9202 Otalgia, left ear: Secondary | ICD-10-CM

## 2012-11-06 DIAGNOSIS — E1140 Type 2 diabetes mellitus with diabetic neuropathy, unspecified: Secondary | ICD-10-CM | POA: Insufficient documentation

## 2012-11-06 DIAGNOSIS — H9209 Otalgia, unspecified ear: Secondary | ICD-10-CM

## 2012-11-06 DIAGNOSIS — E1142 Type 2 diabetes mellitus with diabetic polyneuropathy: Secondary | ICD-10-CM

## 2012-11-06 MED ORDER — GABAPENTIN 300 MG PO CAPS: 300.0000 mg | ORAL_CAPSULE | Freq: Every day | ORAL | Status: DC

## 2012-11-06 NOTE — Progress Notes (Signed)
This is a Psychologist, occupational Note.  The care of the patient was discussed with Dr. Criselda Peaches and the assessment and plan was formulated with their assistance.  Please see their note for official documentation of the patient encounter.   Subjective:   Patient ID: [name] Newton male   DOB: 18-Oct-1960 52 y.o.   MRN: [id number]  HPI: [name] Newton is a 52 y.o. male with a history of uncontrolled diabetes and tobacco abuse presenting with right nose and left ear lesions. [name] Newton woke up on Saturday morning (4 days ago) with lesions present on his external and internal right nare as well as in his left ear. He complains of 7/10 pain with these lesions during manipulation. The left ear lesion has been draining greenish exudate over the past few day. The right nare lesion has formed a scab. Patient complains of nasal congestion and rhinorrhea with persistent blowing of his nose. He reports a fall secondary to vertigo 3 weeks ago, though he does not attribute the current lesions to that fall. He admits to regular Q-tip use in his ears. He denies allergies or changes in vision or hearing (mild tinnitus at baseline). He also denies fevers, chills, chest pain, or shortness of breath worse than baseline.  With respect to his feet, the patient also reports painful burning and numbness. This has persisted for a few months and prevents the patient from walking. He requests gabapentin since this has helped his friends with diabetic foot pain.    Past Medical History  Diagnosis Date  . Diabetes mellitus   . Tobacco abuse   . Hyperlipidemia   . Hypertension   . NSTEMI (non-ST elevated myocardial infarction) April 2012    status post successful PCI and bare-metal  stenting of the first diagonal this admission.   . Guaiac positive stools      Guaiac positive stool with stable H and H.    Current Outpatient Prescriptions  Medication Sig Dispense Refill  . albuterol (PROVENTIL  HFA;VENTOLIN HFA) 108 (90 BASE) MCG/ACT inhaler Inhale 2 puffs into the lungs every 6 (six) hours as needed for wheezing.  1 Inhaler  2  . aspirin 81 MG tablet Take 81 mg by mouth daily as needed.       . Fluticasone-Salmeterol (ADVAIR DISKUS) 100-50 MCG/DOSE AEPB Inhale 1 puff into the lungs every 12 (twelve) hours.  14 each  0  . lovastatin (MEVACOR) 20 MG tablet Take 1 tablet (20 mg total) by mouth at bedtime.  30 tablet  0  . metFORMIN (GLUCOPHAGE) 500 MG tablet TAKE ONE TABLET BY MOUTH TWICE DAILY WITH A MEAL  90 tablet  3  . predniSONE (DELTASONE) 10 MG tablet Take 1 tablet (10 mg total) by mouth daily.  21 tablet  0   No current facility-administered medications for this visit.   Family History  Problem Relation Age of Onset  . Heart attack Father   . Coronary artery disease Brother    History   Social History  . Marital Status: Divorced    Spouse Name: N/A    Number of Children: N/A  . Years of Education: N/A   Occupational History  . none     Unemployed since 2012   Social History Main Topics  . Smoking status: Former Smoker    Quit date: 05/03/2012  . Smokeless tobacco: None  . Alcohol Use: No  . Drug Use: No  . Sexually Active: None   Other Topics Concern  .  None   Social History Narrative   Patient is intermittently homeless without any income at the moment.          Review of Systems: A comprehensive 12 point review of systems was performed and is negative except as stated above. Objective:  Physical Exam: Filed Vitals:   11/06/12 1029  BP: 120/82  Pulse: 60  Temp: 97 F (36.1 C)  TempSrc: Oral  Height: 6\' 4"  (1.93 m)  Weight: 224 lb 9.6 oz (101.878 kg)  SpO2: 100%   General appearance: alert, cooperative and no distress Head: Normocephalic, without obvious abnormality, well-healing scar on apex of scalp. Eyes: PERRL, EOMI. no scleral/conjunctival injection Ears: TM normal bilaterally. crusted appearance to left external auditory canal,  inferior aspect. minimal erythema and without edema. Nose: scab present on external and internal right nare. Nares otherwise normal in apperance without erythema Throat: poor dentition, moist mucus membranes of oropharynx, no lesions present. Neck: no adenopathy, supple, symmetrical, trachea midline and thyroid not enlarged, symmetric, no tenderness/mass/nodules Lungs: clear to auscultation bilaterally and normal work of breathing Heart: regular rate and rhythm, S1, S2 normal, no murmur, click, rub or gallop Abdomen: soft, nontender, nondistended. bowel sounds present. Extremities: extremities normal, atraumatic, no cyanosis or edema Patient has no sensation to monofilament in 6 points on dorsal aspect of feet bilaterally Pulses: 2+ and symmetric Skin: no rashes or lesions present, except on nose and ear as stated above. Assessment & Plan:  [name] Newton is a 51 y.o. male with a history of uncontrolled diabetes and NSTEMI s/p stent presenting with 4 days of right nose and left ear lesions and pain.  Ear pain Left ear pain worse with manipulation of the tragus, present for 4 days, during which the ear has continued to drain greenish fluid. The left ear canal has crusted exudate on the inferior aspect. Ear canal appears non-edematous and without significant erythema following cerumen removal in the office. Patient is a poorly controlled diabetic raising some concern for otitis external which could evolve into malignant otitis externa; however his complaints and exam at this time are more consistent with a small local injury in the process of healing.  - Advised patient to avoid irritating the area further. He should apply Vaseline to the area and cover with a Band-Aid.  - Patient will contact us if his ear pain/drainage does not improve within the next week. At that time would consider antiseptic ear drops with Cortisporin or antibiotic ear drops with ciprofloxacin. Patient is able to obtain  either of these with his orange card through [facility] formulary.  Diabetic neuropathy Hemoglobin A1C  Date Value Range Status  07/04/2012 11.4   Final  09/22/2010 8.5 (NOTE)                                                                       According to the ADA Clinical Practice Recommendations for 2011, when HbA1c is used as a screening test:   >=6.5%   Diagnostic of Diabetes Mellitus           (if abnormal result  is confirmed)  5.7-6.4%   Increased risk of developing Diabetes Mellitus  References:Diagnosis and Classification of Diabetes Mellitus,Diabetes Care,2011,34(Suppl 1):S62-S69 and Standards of Medical Care in  Diabetes - 2011,Diabetes Care,2011,34  (Suppl 1):S11-S61.* <5.7 % Final  12/16/2009 9.1* <5.7 % Final     See lab report for associated comment(s)    Patient has uncontrolled diabetes and complains of multiple months of burning and tingling pain in bilateral feet. He specifically requests a trial of gabapentin since he has friends that claim it works for them. No sensation to monofilament on physical exam bilaterally. - Trial of gabapentin 300mg  at bedtime. Will consider increasing this dose to maximum symptom relief without daytime drowsiness.    FOLLOW UP: Return if problem recurs,  worsens, or new problem develops. Follow up with regularly scheduled PCP visit on 11/28/2012.

## 2012-11-06 NOTE — Assessment & Plan Note (Addendum)
Hemoglobin A1C  Date Value Range Status  07/04/2012 11.4   Final  09/22/2010 8.5 (NOTE)                                                                       According to the ADA Clinical Practice Recommendations for 2011, when HbA1c is used as a screening test:   >=6.5%   Diagnostic of Diabetes Mellitus           (if abnormal result  is confirmed)  5.7-6.4%   Increased risk of developing Diabetes Mellitus  References:Diagnosis and Classification of Diabetes Mellitus,Diabetes Care,2011,34(Suppl 1):S62-S69 and Standards of Medical Care in         Diabetes - 2011,Diabetes Care,2011,34  (Suppl 1):S11-S61.* <5.7 % Final  12/16/2009 9.1* <5.7 % Final     See lab report for associated comment(s)    Patient has uncontrolled diabetes and complains of multiple months of burning and tingling pain in bilateral feet. He specifically requests a trial of gabapentin since he has friends that claim it works for them. - Trial of gabapentin 300mg  at bedtime. Will consider increasing this dose to maximum symptom relief without daytime drowsiness.  Update by Dr. Criselda Peaches We did a foot exam today and he was grossly positive for neuropathy with the monofilament.  He had no sensation in his feet to the mid calf and had decreased sensation in his hands.  He has chronic pain from the neuropathy.  Trial of gabapentin 300mg  qhs will be started and can be uptitrated by his PCP

## 2012-11-06 NOTE — Patient Instructions (Addendum)
Please keep your follow up appointment with Dr. Zada Girt on 11/28/12  For your medications:   Please bring all of your pill  Bottles with you to each visit.  This will help make sure that we have an up to date list of all the medications you are taking.  Please also bring any over the counter herbal medications you are taking (not including advil, tylenol, etc.)  Please continue taking all of your previous medications  Please stop taking Gabapentin 300mg  at bedtime for your nerve pain.  This can dose can be increased to help with your symptoms if they are not controlled.  You will have a paper prescription to take the health department for filling.    For your ear pain, you have a small skin lesion that looks to be healing.  Please keep the area dry and clean.  Please place Vaseline or neosporin to the area and cover overnight.  If your skin does not seem to be improving or the drainage is worsening over the next few days, please call the clinic and come back in to be seen.    Thank you!   Diabetic Neuropathy Diabetic neuropathy is a common complication caused by diabetes. Neuropathy is a term that means nerve disease or damage. If your diabetes is uncontrolled and you have high blood glucose (sugar) levels, over time, this can lead to damage to nerves throughout your body. There are three types of diabetic neuropathy:   Peripheral.  Autonomic.  Focal. PERIPHERAL NEUROPATHY Peripheral neuropathy is the most common form of diabetic neuropathy. It causes damage to the nerves of the feet and legs and eventually the hands and arms.  SYMPTOMS  Peripheral neuropathy occurs slowly over time. The peripheral nerves sense touch, hot and cold, and pain. When these nerves no longer work:   Your feet become numb.  You can no longer feel pressure or pain in your feet.  You may have burning, stabbing or aching pain. This can lead to:  Thick calluses over pressure areas.  Pressure sores.  Ulcers.  Ulcers can become infected with germs (bacteria) and can even lead to infection in the bones of the feet. DIAGNOSIS  The diagnosis of diabetic neuropathy is difficult at best. Sensory function testing can be done with:  Light touch using a monofilament.  Vibration with tuning fork.  Sharp sensation with pin prick Other tests that can help diagnose neuropathy are:  Nerve Conduction Velocities (NCV). This checks the transmission of electrical current through a nerve.  Electromyography (EMG). This shows how muscles respond to electrical signals transmitted by nearby nerves.  Quantitative sensory testing, which is used to assess how your nerves respond to vibration and changes in temperature. TREATMENT Once nerve damage occurs it cannot be reversed. The goal of treatment is to keep the disease from getting worse. If it gets worse, it will affect more nerve fibers. Controlling your blood (sugar) is the key. You will need to keep your blood glucose and A1c at the target range prescribed by your caregiver. Things that will help control blood glucose levels include:  Blood glucose monitoring.  Meal planning.  Physical activity.  Diabetes medication. Over time, maintaining lower blood glucose levels helps lessen symptoms. Sometimes, prescription pain medicine is needed. Focal neuropathy can be painful and unpredictable and occurs most often in older adults with diabetes.  SEEK MEDICAL CARE IF:   You develop peripheral nerve symptoms such as burning, numbness, or pain in your feet, legs or hands.  You develop autonomic nerve symptoms such as:  Dizziness.  Abnormal urinary control.  Inability to get an erection.  You develop focal nerve symptoms such as sudden abnormal eye movements or sudden foot drop. Document Released: 07/25/2001 Document Revised: 08/08/2011 Document Reviewed: 10/24/2008 [facility] Patient Information 2014 [place], [place].

## 2012-11-06 NOTE — Assessment & Plan Note (Addendum)
Left ear pain worse with manipulation of the tragus, present for 4 days, during which the ear has continued to drain greenish fluid. The left ear canal has crusted exudate on the inferior aspect. Ear canal appears non-edematous and without significant erythema following cerumen removal. Patient is a poorly controlled diabetic raising some concern for otitis external which could evolve into malignant otitis externa; however his complaints and exam at this time are more consistent with a small local injury in the process of healing.  - Advised patient to avoid irritating the area further. He should apply Vaseline to the area and cover with a Band-Aid.  - Patient will contact us if his ear pain/drainage does not improve within the next week. At that time would consider antiseptic ear drops with Cortisporin or antibiotic ear drops with ciprofloxacin. Patient is able to obtain either of these with his orange card through [facility] formulary.  Update by Dr. Criselda Newton  Agree with MS III [name] Newton's assessment/plan.   [name] Newton has a large amount of wax in the left ear canal.  Once that was removed, it revealed a relatively normal external ear canal and TM.  He had a laceration/skin tear at the area where the tragus meets the lobe and this appears to be the source of the pain.  We advised him to decrease manipulation of the area, keep covered and use vaseline as a barrier to allow healing.  If the area does not start to heal within the next week, he is to call in to the clinic.  I did not feel this clinically met the criteria for otitis externa as it was outside the ear canal and appeared to be an abrasion.   The wound on his nose is healing well, appears to be an abrasion as well.

## 2012-11-06 NOTE — Assessment & Plan Note (Addendum)
Left ear pain worse with manipulation of the tragus, present for 4 days. The left ear canal has crusted exudate on the inferior aspect which is cracked and erythematous. The ear has continued to drain greenish fluid. Patient is a poorly controlled diabetic raising concern for otitis external which could evolve into malignant otitis externa.  - Cortisporin (neomycin, polymyxin B, hydrocortisone) otic suspension for one week.  - Patient will contact us if his ear pain/drainage does not improve within the next week. At that time would consider antibiotic ear drops with special consideration given to the patient's financial status.

## 2012-11-06 NOTE — Progress Notes (Signed)
Subjective:    Patient ID: [name] Newton, male    DOB: 07-Jun-1960, 52 y.o.   MRN: [id number]  CC: Ear Pain/drainage  HPI  [name] Newton is a 51yo man with PMH of uncontrolled DM, CAD s/p stent.   He reports that he woke up on Saturday with draining lesions from his left ear and right exterior and interior nare. The left ear was draining a greenish liquid which he noticed on a q tip.  He uses q tips reguarly.  He has tried hydrogen peroxide which helped a little bit with the drainage.  The ear and nose are painful (7/10) but only with manipulation.  He has had associated nasal congestion and rhinnhorrea. He denies history of allergies, change in vision/hearing, fever, chills, night sweats.  He does have a history of vertigo, last passed out 3 weeks ago.  He does not think these lesions are related to an injury.  He had a tick bite over the weekend on his arm, but he does not have any rash, joint pain or fatigue.   He has chronic pain in his feet which is burning in nature, but also associated with a numbness.  He is diabetic and frustrated with the pain in his feet.  He notes that he tries to exercise, but is unable to walk very far because of the pain.    Review of Systems  Constitutional: Positive for activity change (due to pain in feet). Negative for fever and chills.  HENT: Positive for ear pain, congestion, rhinorrhea, tinnitus (chronic at baseline) and ear discharge. Negative for hearing loss, sore throat, facial swelling, mouth sores, trouble swallowing, neck pain and dental problem.        + lesion on right nare  Eyes: Negative for pain and visual disturbance.  Respiratory: Positive for wheezing. Negative for cough and shortness of breath.   Cardiovascular: Negative for chest pain and leg swelling.  Gastrointestinal: Negative for abdominal pain, diarrhea and constipation.  Genitourinary: Negative for dysuria.       + urination every hour at night.   Musculoskeletal: Positive  for gait problem (due to pain in feet). Negative for back pain and arthralgias.  Skin: Negative for pallor and rash.  Neurological: Positive for dizziness (occasional) and syncope (3 weeks ago).  Psychiatric/Behavioral: Negative for confusion and decreased concentration.       Objective:   Physical Exam  Constitutional: He is oriented to person, place, and time. He appears well-developed and well-nourished. No distress.  HENT:  Head: Normocephalic and atraumatic.  Right Ear: Tympanic membrane, external ear and ear canal normal. No tenderness. No mastoid tenderness.  Left Ear: Tympanic membrane and ear canal normal. Left ear exhibits lacerations. No drainage or swelling. No mastoid tenderness.  Mouth/Throat: Oropharynx is clear and moist.  Nose with healing 0.5cm X 0.25 cm eschar on lateral side of the right nare, small area of ulceration in right nare.  No drainage.  Left nostril/nare normal  Eyes: Conjunctivae are normal. Pupils are equal, round, and reactive to light. Right eye exhibits no discharge. Left eye exhibits no discharge. No scleral icterus.  Neck: Neck supple.  Cardiovascular: Normal rate, regular rhythm and normal heart sounds.   No murmur heard. Pulmonary/Chest: Effort normal. No respiratory distress. He has wheezes (inspiratory at bases).  Abdominal: Bowel sounds are normal. There is no tenderness.  Lymphadenopathy:    He has no cervical adenopathy.  Neurological: He is alert and oriented to person, place, and time.  Skin: Skin  is warm and dry.  Some small wounds to left upper arm, at site of recent tick bite.  No rash.  Well tanned skin  Psychiatric: He has a normal mood and affect. His behavior is normal.          Assessment & Plan:  RTC on 11/28/12 with PCP for follow up of chronic issues.

## 2012-11-06 NOTE — Telephone Encounter (Signed)
Pt took Rx to [facility] and they don't supply neurontin.  I called Rx into [facility] on [address].

## 2012-11-28 ENCOUNTER — Ambulatory Visit (INDEPENDENT_AMBULATORY_CARE_PROVIDER_SITE_OTHER): Payer: No Typology Code available for payment source | Admitting: Internal Medicine

## 2012-11-28 DIAGNOSIS — IMO0001 Reserved for inherently not codable concepts without codable children: Secondary | ICD-10-CM

## 2012-11-29 NOTE — Progress Notes (Signed)
Patient ID: [name] Newton, male   DOB: [birth year], 52 y.o.   MRN: [id number] Patient did not show up for appointment/

## 2012-12-03 ENCOUNTER — Ambulatory Visit (INDEPENDENT_AMBULATORY_CARE_PROVIDER_SITE_OTHER): Payer: No Typology Code available for payment source | Admitting: Internal Medicine

## 2012-12-03 ENCOUNTER — Encounter: Payer: Self-pay | Admitting: Internal Medicine

## 2012-12-03 VITALS — BP 113/74 | HR 79 | Temp 97.1°F | Ht 75.5 in | Wt 220.5 lb

## 2012-12-03 DIAGNOSIS — E1149 Type 2 diabetes mellitus with other diabetic neurological complication: Secondary | ICD-10-CM

## 2012-12-03 DIAGNOSIS — E785 Hyperlipidemia, unspecified: Secondary | ICD-10-CM

## 2012-12-03 DIAGNOSIS — E1140 Type 2 diabetes mellitus with diabetic neuropathy, unspecified: Secondary | ICD-10-CM

## 2012-12-03 DIAGNOSIS — L989 Disorder of the skin and subcutaneous tissue, unspecified: Secondary | ICD-10-CM

## 2012-12-03 DIAGNOSIS — Z720 Tobacco use: Secondary | ICD-10-CM | POA: Insufficient documentation

## 2012-12-03 DIAGNOSIS — F17200 Nicotine dependence, unspecified, uncomplicated: Secondary | ICD-10-CM

## 2012-12-03 DIAGNOSIS — J449 Chronic obstructive pulmonary disease, unspecified: Secondary | ICD-10-CM

## 2012-12-03 DIAGNOSIS — E1165 Type 2 diabetes mellitus with hyperglycemia: Secondary | ICD-10-CM

## 2012-12-03 DIAGNOSIS — I2510 Atherosclerotic heart disease of native coronary artery without angina pectoris: Secondary | ICD-10-CM

## 2012-12-03 DIAGNOSIS — E1142 Type 2 diabetes mellitus with diabetic polyneuropathy: Secondary | ICD-10-CM

## 2012-12-03 LAB — HIV ANTIBODY (ROUTINE TESTING W REFLEX): HIV: NONREACTIVE

## 2012-12-03 LAB — POCT GLYCOSYLATED HEMOGLOBIN (HGB A1C): Hemoglobin A1C: 12.5

## 2012-12-03 LAB — GLUCOSE, CAPILLARY: Glucose-Capillary: 336 mg/dL — ABNORMAL HIGH (ref 70–99)

## 2012-12-03 MED ORDER — SULFAMETHOXAZOLE-TMP DS 800-160 MG PO TABS: 1.0000 | ORAL_TABLET | Freq: Two times a day (BID) | ORAL | Status: DC

## 2012-12-03 MED ORDER — GABAPENTIN 300 MG PO CAPS: 300.0000 mg | ORAL_CAPSULE | Freq: Three times a day (TID) | ORAL | Status: DC

## 2012-12-03 MED ORDER — NITROGLYCERIN 0.4 MG SL SUBL: 0.4000 mg | SUBLINGUAL_TABLET | SUBLINGUAL | Status: DC | PRN

## 2012-12-03 MED ORDER — GLIPIZIDE 5 MG PO TABS: 5.0000 mg | ORAL_TABLET | Freq: Every day | ORAL | Status: DC

## 2012-12-03 MED ORDER — METFORMIN HCL 1000 MG PO TABS: 1000.0000 mg | ORAL_TABLET | Freq: Two times a day (BID) | ORAL | Status: DC

## 2012-12-03 MED ORDER — LOVASTATIN 20 MG PO TABS: 20.0000 mg | ORAL_TABLET | Freq: Every day | ORAL | Status: DC

## 2012-12-03 MED ORDER — ALBUTEROL SULFATE HFA 108 (90 BASE) MCG/ACT IN AERS: 2.0000 | INHALATION_SPRAY | Freq: Four times a day (QID) | RESPIRATORY_TRACT | Status: DC | PRN

## 2012-12-03 NOTE — Patient Instructions (Addendum)
General Instructions: Please follow up in 1-3 months, sooner if needed  When you can afford it try to buy a [employer] Prime meter  Please take Glipizide 5 mg daily Increase Metformin to 1000 mg daily Take Bactrim twice a day for 7 days  Pick up your cholesterol medication and start taking it Try to quit smoking Take Tylenol or Ibuprofen for pain Come to the Emergency Room if you pass out again or feel like hurting yourself call 911  Treatment Goals:  Goals (1 Years of Data) as of 12/03/12         As of Today 07/04/12 09/22/10     Result Component    . HEMOGLOBIN A1C < 7.0  12.5 11.4 8.5...    . LDL CALC < 130    UNABLE TO CALCULA...      Progress Toward Treatment Goals:  Treatment Goal 12/03/2012  Hemoglobin A1C deteriorated  Stop smoking smoking less  Prevent falls unable to assess    Self Care Goals & Plans:  Self Care Goal 12/03/2012  Manage my medications take my medicines as prescribed; bring my medications to every visit; refill my medications on time  Monitor my health -  Eat healthy foods drink diet soda or water instead of juice or soda; eat more vegetables; eat foods that are low in salt; eat baked foods instead of fried foods; eat fruit for snacks and desserts; eat smaller portions  Stop smoking call [facility] (1-800-QUIT-NOW)  Meeting treatment goals maintain the current self-care plan       Care Management & Community Referrals:  Referral 12/03/2012  Referrals made for care management support diabetes educator  Referrals made to community resources none       Smoking Cessation Quitting smoking is important to your health and has many advantages. However, it is not always easy to quit since nicotine is a very addictive drug. Often times, people try 3 times or more before being able to quit. This document explains the best ways for you to prepare to quit smoking. Quitting takes hard work and a lot of effort, but you can do it. ADVANTAGES OF QUITTING SMOKING  You  will live longer, feel better, and live better.  Your body will feel the impact of quitting smoking almost immediately.  Within 20 minutes, blood pressure decreases. Your pulse returns to its normal level.  After 8 hours, carbon monoxide levels in the blood return to normal. Your oxygen level increases.  After 24 hours, the chance of having a heart attack starts to decrease. Your breath, hair, and body stop smelling like smoke.  After 48 hours, damaged nerve endings begin to recover. Your sense of taste and smell improve.  After 72 hours, the body is virtually free of nicotine. Your bronchial tubes relax and breathing becomes easier.  After 2 to 12 weeks, lungs can hold more air. Exercise becomes easier and circulation improves.  The risk of having a heart attack, stroke, cancer, or lung disease is greatly reduced.  After 1 year, the risk of coronary heart disease is cut in half.  After 5 years, the risk of stroke falls to the same as a nonsmoker.  After 10 years, the risk of lung cancer is cut in half and the risk of other cancers decreases significantly.  After 15 years, the risk of coronary heart disease drops, usually to the level of a nonsmoker.  If you are pregnant, quitting smoking will improve your chances of having a healthy baby.  The  people you live with, especially any children, will be healthier.  You will have extra money to spend on things other than cigarettes. QUESTIONS TO THINK ABOUT BEFORE ATTEMPTING TO QUIT You may want to talk about your answers with your caregiver.  Why do you want to quit?  If you tried to quit in the past, what helped and what did not?  What will be the most difficult situations for you after you quit? How will you plan to handle them?  Who can help you through the tough times? Your family? Friends? A caregiver?  What pleasures do you get from smoking? What ways can you still get pleasure if you quit? Here are some questions to ask  your caregiver:  How can you help me to be successful at quitting?  What medicine do you think would be best for me and how should I take it?  What should I do if I need more help?  What is smoking withdrawal like? How can I get information on withdrawal? GET READY  Set a quit date.  Change your environment by getting rid of all cigarettes, ashtrays, matches, and lighters in your home, car, or work. Do not let people smoke in your home.  Review your past attempts to quit. Think about what worked and what did not. GET SUPPORT AND ENCOURAGEMENT You have a better chance of being successful if you have help. You can get support in many ways.  Tell your family, friends, and co-workers that you are going to quit and need their support. Ask them not to smoke around you.  Get individual, group, or telephone counseling and support. Programs are available at [employer] and health centers. Call your local health department for information about programs in your area.  Spiritual beliefs and practices may help some smokers quit.  Download a "quit meter" on your computer to keep track of quit statistics, such as how long you have gone without smoking, cigarettes not smoked, and money saved.  Get a self-help book about quitting smoking and staying off of tobacco. LEARN NEW SKILLS AND BEHAVIORS  Distract yourself from urges to smoke. Talk to someone, go for a walk, or occupy your time with a task.  Change your normal routine. Take a different route to work. Drink tea instead of coffee. Eat breakfast in a different place.  Reduce your stress. Take a hot bath, exercise, or read a book.  Plan something enjoyable to do every day. Reward yourself for not smoking.  Explore interactive web-based programs that specialize in helping you quit. GET MEDICINE AND USE IT CORRECTLY Medicines can help you stop smoking and decrease the urge to smoke. Combining medicine with the above behavioral methods  and support can greatly increase your chances of successfully quitting smoking.  Nicotine replacement therapy helps deliver nicotine to your body without the negative effects and risks of smoking. Nicotine replacement therapy includes nicotine gum, lozenges, inhalers, nasal sprays, and skin patches. Some may be available over-the-counter and others require a prescription.  Antidepressant medicine helps people abstain from smoking, but how this works is unknown. This medicine is available by prescription.  Nicotinic receptor partial agonist medicine simulates the effect of nicotine in your brain. This medicine is available by prescription. Ask your caregiver for advice about which medicines to use and how to use them based on your health history. Your caregiver will tell you what side effects to look out for if you choose to be on a medicine or therapy.  Carefully read the information on the package. Do not use any other product containing nicotine while using a nicotine replacement product.  RELAPSE OR DIFFICULT SITUATIONS Most relapses occur within the first 3 months after quitting. Do not be discouraged if you start smoking again. Remember, most people try several times before finally quitting. You may have symptoms of withdrawal because your body is used to nicotine. You may crave cigarettes, be irritable, feel very hungry, cough often, get headaches, or have difficulty concentrating. The withdrawal symptoms are only temporary. They are strongest when you first quit, but they will go away within 10 14 days. To reduce the chances of relapse, try to:  Avoid drinking alcohol. Drinking lowers your chances of successfully quitting.  Reduce the amount of caffeine you consume. Once you quit smoking, the amount of caffeine in your body increases and can give you symptoms, such as a rapid heartbeat, sweating, and anxiety.  Avoid smokers because they can make you want to smoke.  Do not let weight gain  distract you. Many smokers will gain weight when they quit, usually less than 10 pounds. Eat a healthy diet and stay active. You can always lose the weight gained after you quit.  Find ways to improve your mood other than smoking. FOR MORE INFORMATION  www.smokefree.gov  Document Released: 05/10/2001 Document Revised: 11/15/2011 Document Reviewed: 08/25/2011 [facility] Patient Information 2014 [place], [place].  Insomnia Insomnia is frequent trouble falling and/or staying asleep. Insomnia can be a long term problem or a short term problem. Both are common. Insomnia can be a short term problem when the wakefulness is related to a certain stress or worry. Long term insomnia is often related to ongoing stress during waking hours and/or poor sleeping habits. Overtime, sleep deprivation itself can make the problem worse. Every little thing feels more severe because you are overtired and your ability to cope is decreased. CAUSES   Stress, anxiety, and depression.  Poor sleeping habits.  Distractions such as TV in the bedroom.  Naps close to bedtime.  Engaging in emotionally charged conversations before bed.  Technical reading before sleep.  Alcohol and other sedatives. They may make the problem worse. They can hurt normal sleep patterns and normal dream activity.  Stimulants such as caffeine for several hours prior to bedtime.  Pain syndromes and shortness of breath can cause insomnia.  Exercise late at night.  Changing time zones may cause sleeping problems (jet lag). It is sometimes helpful to have someone observe your sleeping patterns. They should look for periods of not breathing during the night (sleep apnea). They should also look to see how long those periods last. If you live alone or observers are uncertain, you can also be observed at a sleep clinic where your sleep patterns will be professionally monitored. Sleep apnea requires a checkup and treatment. Give your caregivers your  medical history. Give your caregivers observations your family has made about your sleep.  SYMPTOMS   Not feeling rested in the morning.  Anxiety and restlessness at bedtime.  Difficulty falling and staying asleep. TREATMENT   Your caregiver may prescribe treatment for an underlying medical disorders. Your caregiver can give advice or help if you are using alcohol or other drugs for self-medication. Treatment of underlying problems will usually eliminate insomnia problems.  Medications can be prescribed for short time use. They are generally not recommended for lengthy use.  Over-the-counter sleep medicines are not recommended for lengthy use. They can be habit forming.  You can promote  easier sleeping by making lifestyle changes such as:  Using relaxation techniques that help with breathing and reduce muscle tension.  Exercising earlier in the day.  Changing your diet and the time of your last meal. No night time snacks.  Establish a regular time to go to bed.  Counseling can help with stressful problems and worry.  Soothing music and white noise may be helpful if there are background noises you cannot remove.  Stop tedious detailed work at least one hour before bedtime. HOME CARE INSTRUCTIONS   Keep a diary. Inform your caregiver about your progress. This includes any medication side effects. See your caregiver regularly. Take note of:  Times when you are asleep.  Times when you are awake during the night.  The quality of your sleep.  How you feel the next day. This information will help your caregiver care for you.  Get out of bed if you are still awake after 15 minutes. Read or do some quiet activity. Keep the lights down. Wait until you feel sleepy and go back to bed.  Keep regular sleeping and waking hours. Avoid naps.  Exercise regularly.  Avoid distractions at bedtime. Distractions include watching television or engaging in any intense or detailed activity  like attempting to balance the household checkbook.  Develop a bedtime ritual. Keep a familiar routine of bathing, brushing your teeth, climbing into bed at the same time each night, listening to soothing music. Routines increase the success of falling to sleep faster.  Use relaxation techniques. This can be using breathing and muscle tension release routines. It can also include visualizing peaceful scenes. You can also help control troubling or intruding thoughts by keeping your mind occupied with boring or repetitive thoughts like the old concept of counting sheep. You can make it more creative like imagining planting one beautiful flower after another in your backyard garden.  During your day, work to eliminate stress. When this is not possible use some of the previous suggestions to help reduce the anxiety that accompanies stressful situations. MAKE SURE YOU:   Understand these instructions.  Will watch your condition.  Will get help right away if you are not doing well or get worse. Document Released: 05/13/2000 Document Revised: 08/08/2011 Document Reviewed: 06/13/2007 [facility] Patient Information 2014 [place], [place].  Depression, Adult Depression refers to feeling sad, low, down in the dumps, blue, gloomy, or empty. In general, there are two kinds of depression: 1. Depression that we all experience from time to time because of upsetting life experiences, including the loss of a job or the ending of a relationship (normal sadness or normal grief). This kind of depression is considered normal, is short lived, and resolves within a few days to 2 weeks. (Depression experienced after the loss of a loved one is called bereavement. Bereavement often lasts longer than 2 weeks but normally gets better with time.) 2. Clinical depression, which lasts longer than normal sadness or normal grief or interferes with your ability to function at home, at work, and in school. It also interferes with your  personal relationships. It affects almost every aspect of your life. Clinical depression is an illness. Symptoms of depression also can be caused by conditions other than normal sadness and grief or clinical depression. Examples of these conditions are listed as follows:  Physical illness Some physical illnesses, including underactive thyroid gland (hypothyroidism), severe anemia, specific types of cancer, diabetes, uncontrolled seizures, heart and lung problems, strokes, and chronic pain are commonly associated with symptoms  of depression.  Side effects of some prescription medicine In some people, certain types of prescription medicine can cause symptoms of depression.  Substance abuse Abuse of alcohol and illicit drugs can cause symptoms of depression. SYMPTOMS Symptoms of normal sadness and normal grief include the following:  Feeling sad or crying for short periods of time.  Not caring about anything (apathy).  Difficulty sleeping or sleeping too much.  No longer able to enjoy the things you used to enjoy.  Desire to be by oneself all the time (social isolation).  Lack of energy or motivation.  Difficulty concentrating or remembering.  Change in appetite or weight.  Restlessness or agitation. Symptoms of clinical depression include the same symptoms of normal sadness or normal grief and also the following symptoms:  Feeling sad or crying all the time.  Feelings of guilt or worthlessness.  Feelings of hopelessness or helplessness.  Thoughts of suicide or the desire to harm yourself (suicidal ideation).  Loss of touch with reality (psychotic symptoms). Seeing or hearing things that are not real (hallucinations) or having false beliefs about your life or the people around you (delusions and paranoia). DIAGNOSIS  The diagnosis of clinical depression usually is based on the severity and duration of the symptoms. Your caregiver also will ask you questions about your medical  history and substance use to find out if physical illness, use of prescription medicine, or substance abuse is causing your depression. Your caregiver also may order blood tests. TREATMENT  Typically, normal sadness and normal grief do not require treatment. However, sometimes antidepressant medicine is prescribed for bereavement to ease the depressive symptoms until they resolve. The treatment for clinical depression depends on the severity of your symptoms but typically includes antidepressant medicine, counseling with a mental health professional, or a combination of both. Your caregiver will help to determine what treatment is best for you. Depression caused by physical illness usually goes away with appropriate medical treatment of the illness. If prescription medicine is causing depression, talk with your caregiver about stopping the medicine, decreasing the dose, or substituting another medicine. Depression caused by abuse of alcohol or illicit drugs abuse goes away with abstinence from these substances. Some adults need professional help in order to stop drinking or using drugs. SEEK IMMEDIATE CARE IF:  You have thoughts about hurting yourself or others.  You lose touch with reality (have psychotic symptoms).  You are taking medicine for depression and have a serious side effect. FOR MORE INFORMATION National Alliance on Mental Illness: www.nami.[employer] of Mental [employer]: http://www.maynard.net/ Document Released: 05/13/2000 Document Revised: 11/15/2011 Document Reviewed: 08/15/2011 [facility] Patient Information 2014 [place], [place].  Constipation, Adult Constipation is when a person has fewer than 3 bowel movements a week; has difficulty having a bowel movement; or has stools that are dry, hard, or larger than normal. As people grow older, constipation is more common. If you try to fix constipation with medicines that make you have a bowel movement (laxatives), the problem may  get worse. Long-term laxative use may cause the muscles of the colon to become weak. A low-fiber diet, not taking in enough fluids, and taking certain medicines may make constipation worse. CAUSES   Certain medicines, such as antidepressants, pain medicine, iron supplements, antacids, and water pills.   Certain diseases, such as diabetes, irritable bowel syndrome (IBS), thyroid disease, or depression.   Not drinking enough water.   Not eating enough fiber-rich foods.   Stress or travel.  Lack of physical activity or  exercise.  Not going to the restroom when there is the urge to have a bowel movement.  Ignoring the urge to have a bowel movement.  Using laxatives too much. SYMPTOMS   Having fewer than 3 bowel movements a week.   Straining to have a bowel movement.   Having hard, dry, or larger than normal stools.   Feeling full or bloated.   Pain in the lower abdomen.  Not feeling relief after having a bowel movement. DIAGNOSIS  Your caregiver will take a medical history and perform a physical exam. Further testing may be done for severe constipation. Some tests may include:   A barium enema X-ray to examine your rectum, colon, and sometimes, your small intestine.  A sigmoidoscopy to examine your lower colon.  A colonoscopy to examine your entire colon. TREATMENT  Treatment will depend on the severity of your constipation and what is causing it. Some dietary treatments include drinking more fluids and eating more fiber-rich foods. Lifestyle treatments may include regular exercise. If these diet and lifestyle recommendations do not help, your caregiver may recommend taking over-the-counter laxative medicines to help you have bowel movements. Prescription medicines may be prescribed if over-the-counter medicines do not work.  HOME CARE INSTRUCTIONS   Increase dietary fiber in your diet, such as fruits, vegetables, whole grains, and beans. Limit high-fat and processed  sugars in your diet, such as [place] fries, hamburgers, cookies, candies, and soda.   A fiber supplement may be added to your diet if you cannot get enough fiber from foods.   Drink enough fluids to keep your urine clear or pale yellow.   Exercise regularly or as directed by your caregiver.   Go to the restroom when you have the urge to go. Do not hold it.  Only take medicines as directed by your caregiver. Do not take other medicines for constipation without talking to your caregiver first. SEEK IMMEDIATE MEDICAL CARE IF:   You have bright red blood in your stool.   Your constipation lasts for more than 4 days or gets worse.   You have abdominal or rectal pain.   You have thin, pencil-like stools.  You have unexplained weight loss. MAKE SURE YOU:   Understand these instructions.  Will watch your condition.  Will get help right away if you are not doing well or get worse. Document Released: 02/12/2004 Document Revised: 08/08/2011 Document Reviewed: 04/19/2011 [facility] Patient Information 2014 [place], [place].  Shortness of Breath Shortness of breath means you have trouble breathing. Shortness of breath may indicate that you have a medical problem. You should seek immediate medical care for shortness of breath. CAUSES   Not enough oxygen in the air (as with high altitudes or a smoke-filled room).  Short-term (acute) lung disease, including:  Infections, such as pneumonia.  Fluid in the lungs, such as heart failure.  A blood clot in the lungs (pulmonary embolism).  Long-term (chronic) lung diseases.  Heart disease (heart attack, angina, heart failure, and others).  Low red blood cells (anemia).  Poor physical fitness. This can cause shortness of breath when you exercise.  Chest or back injuries or stiffness.  Being overweight.  Smoking.  Anxiety. This can make you feel like you are not getting enough air. DIAGNOSIS  Serious medical problems can  usually be found during your physical exam. Tests may also be done to determine why you are having shortness of breath. Tests may include:  Chest X-rays.  Lung function tests.  Blood tests.  Electrocardiography.  Exercise testing.  Echocardiography.  Imaging scans. Your caregiver may not be able to find a cause for your shortness of breath after your exam. In this case, it is important to have a follow-up exam with your caregiver as directed.  TREATMENT  Treatment for shortness of breath depends on the cause of your symptoms and can vary greatly. HOME CARE INSTRUCTIONS   Do not smoke. Smoking is a common cause of shortness of breath. If you smoke, ask for help to quit.  Avoid being around chemicals or things that may bother your breathing, such as paint fumes and dust.  Rest as needed. Slowly resume your usual activities.  If medicines were prescribed, take them as directed for the full length of time directed. This includes oxygen and any inhaled medicines.  Keep all follow-up appointments as directed by your caregiver. SEEK MEDICAL CARE IF:   Your condition does not improve in the time expected.  You have a hard time doing your normal activities even with rest.  You have any side effects or problems with the medicines prescribed.  You develop any new symptoms. SEEK IMMEDIATE MEDICAL CARE IF:   Your shortness of breath gets worse.  You feel lightheaded, faint, or develop a cough not controlled with medicines.  You start coughing up blood.  You have pain with breathing.  You have chest pain or pain in your arms, shoulders, or abdomen.  You have a fever.  You are unable to walk up stairs or exercise the way you normally do. MAKE SURE YOU:  Understand these instructions.  Will watch your condition.  Will get help right away if you are not doing well or get worse. Document Released: 02/08/2001 Document Revised: 11/15/2011 Document Reviewed:  08/01/2011 [facility] Patient Information 2014 [place], [place].  Cholesterol Cholesterol is a white, waxy, fat-like protein needed by your body in small amounts. The liver makes all the cholesterol you need. It is carried from the liver by the blood through the blood vessels. Deposits (plaque) may build up on blood vessel walls. This makes the arteries narrower and stiffer. Plaque increases the risk for heart attack and stroke. You cannot feel your cholesterol level even if it is very high. The only way to know is by a blood test to check your lipid (fats) levels. Once you know your cholesterol levels, you should keep a record of the test results. Work with your caregiver to to keep your levels in the desired range. WHAT THE RESULTS MEAN:  Total cholesterol is a rough measure of all the cholesterol in your blood.  LDL is the so-called bad cholesterol. This is the type that deposits cholesterol in the walls of the arteries. You want this level to be low.  HDL is the good cholesterol because it cleans the arteries and carries the LDL away. You want this level to be high.  Triglycerides are fat that the body can either burn for energy or store. High levels are closely linked to heart disease. DESIRED LEVELS:  Total cholesterol below 200.  LDL below 100 for people at risk, below 70 for very high risk.  HDL above 50 is good, above 60 is best.  Triglycerides below 150. HOW TO LOWER YOUR CHOLESTEROL:  Diet.  Choose fish or white meat chicken and [place], roasted or baked. Limit fatty cuts of red meat, fried foods, and processed meats, such as sausage and lunch meat.  Eat lots of fresh fruits and vegetables. Choose whole grains, beans, pasta, potatoes  and cereals.  Use only small amounts of olive, corn or canola oils. Avoid butter, mayonnaise, shortening or palm kernel oils. Avoid foods with trans-fats.  Use skim/nonfat milk and low-fat/nonfat yogurt and cheeses. Avoid whole milk, cream, ice  cream, egg yolks and cheeses. Healthy desserts include angel food cake, ginger snaps, animal crackers, hard candy, popsicles, and low-fat/nonfat frozen yogurt. Avoid pastries, cakes, pies and cookies.  Exercise.  A regular program helps decrease LDL and raises HDL.  Helps with weight control.  Do things that increase your activity level like gardening, walking, or taking the stairs.  Medication.  May be prescribed by your caregiver to help lowering cholesterol and the risk for heart disease.  You may need medicine even if your levels are normal if you have several risk factors. HOME CARE INSTRUCTIONS   Follow your diet and exercise programs as suggested by your caregiver.  Take medications as directed.  Have blood work done when your caregiver feels it is necessary. MAKE SURE YOU:   Understand these instructions.  Will watch your condition.  Will get help right away if you are not doing well or get worse. Document Released: 02/08/2001 Document Revised: 08/08/2011 Document Reviewed: 08/01/2007 [facility] Patient Information 2014 [place], [place].  Erectile Dysfunction Erectile dysfunction (ED) is the inability to get a good enough erection to have sexual intercourse. ED may involve:  Inability to get an erection.  Lack of enough hardness to allow penetration.  Loss of the erection before sex is finished.  Premature ejaculation.  Any combination of these problems if they occur more than 25% of the time. CAUSES  Certain drugs, such as:  Pain relievers.  Antihistamines.  Antidepressants.  Blood pressure medicines.  Water pills.  Ulcer medicines.  Muscle relaxants.  Illegal drugs.  Excessive drinking.  Psychological causes, such as:  Anxiety.  Depression.  Sadness.  Exhaustion.  Performance fear.  Stress.  Physical causes, such as:  Artery problems. This may include diabetes, smoking, liver disease, or atherosclerosis.  High blood  pressure.  Hormonal problems, such as low testosterone.  Obesity.  Nerve problems. This may include back or pelvic injuries, diabetes, multiple sclerosis, Parkinson's disease, or some surgeries. SYMPTOMS  Inability to get an erection.  Lack of enough hardness to allow penetration.  Loss of the erection before sex is finished.  Premature ejaculation.  Normal erections at some times, but with frequent unsatisfactory episodes.  Orgasms that are not satisfactory in sensation or frequency.  Low sexual satisfaction in either partner because of erection problems.  A curved penis occurring with erection. The curve may cause pain or may be too curved to allow for intercourse.  Never having nighttime erections. DIAGNOSIS Your caregiver can often diagnose this condition by:  Performing a physical exam to find other diseases or specific problems with the penis.  Asking you detailed questions about the problem.  Performing blood tests to check for diabetes or to measure hormone levels.  Performing urine tests to find other underlying health conditions.  Performing an ultrasound to check for scarring.  Performing a test to check blood flow to the penis.  Doing a sleep study at home to measure nighttime erections. TREATMENT   You may be prescribed medicines by mouth.  You may be given medicine injections into the penis.  You may be prescribed a vacuum pump with a ring.  Penile implant surgery may be performed. You may receive:  An inflatable implant.  A semi-rigid implant.  Blood vessel surgery may be performed.  HOME CARE INSTRUCTIONS  Take all medicine as directed by your caregiver. Do not take any other medicines without talking to your caregiver first.  Follow your caregiver's directions for specific treatments as prescribed.  Follow up with your caregiver as directed. Document Released: 05/13/2000 Document Revised: 08/08/2011 Document Reviewed:  09/05/2010 [facility] Patient Information 2014 [place], [place].  Type 2 Diabetes Mellitus, Adult Type 2 diabetes mellitus, often simply referred to as type 2 diabetes, is a long-lasting (chronic) disease. In type 2 diabetes, the pancreas does not make enough insulin (a hormone), the cells are less responsive to the insulin that is made (insulin resistance), or both. Normally, insulin moves sugars from food into the tissue cells. The tissue cells use the sugars for energy. The lack of insulin or the lack of normal response to insulin causes excess sugars to build up in the blood instead of going into the tissue cells. As a result, high blood sugar (hyperglycemia) develops. The effect of high sugar (glucose) levels can cause many complications. Type 2 diabetes was also previously called adult-onset diabetes but it can occur at any age.  RISK FACTORS  A person is predisposed to developing type 2 diabetes if someone in the family has the disease and also has one or more of the following primary risk factors:  Overweight.  An inactive lifestyle.  A history of consistently eating high-calorie foods. Maintaining a normal weight and regular physical activity can reduce the chance of developing type 2 diabetes. SYMPTOMS  A person with type 2 diabetes may not show symptoms initially. The symptoms of type 2 diabetes appear slowly. The symptoms include:  Increased thirst (polydipsia).  Increased urination (polyuria).  Increased urination during the night (nocturia).  Weight loss. This weight loss may be rapid.  Frequent, recurring infections.  Tiredness (fatigue).  Weakness.  Vision changes, such as blurred vision.  Fruity smell to your breath.  Abdominal pain.  Nausea or vomiting.  Cuts or bruises which are slow to heal.  Tingling or numbness in the hands or feet. DIAGNOSIS Type 2 diabetes is frequently not diagnosed until complications of diabetes are present. Type 2 diabetes is  diagnosed when symptoms or complications are present and when blood glucose levels are increased. Your blood glucose level may be checked by one or more of the following blood tests:  A fasting blood glucose test. You will not be allowed to eat for at least 8 hours before a blood sample is taken.  A random blood glucose test. Your blood glucose is checked at any time of the day regardless of when you ate.  A hemoglobin A1c blood glucose test. A hemoglobin A1c test provides information about blood glucose control over the previous 3 months.  An oral glucose tolerance test (OGTT). Your blood glucose is measured after you have not eaten (fasted) for 2 hours and then after you drink a glucose-containing beverage. TREATMENT   You may need to take insulin or diabetes medicine daily to keep blood glucose levels in the desired range.  You will need to match insulin dosing with exercise and healthy food choices. The treatment goal is to maintain the before meal blood sugar (preprandial glucose) level at 70 130 mg/dL. HOME CARE INSTRUCTIONS   Have your hemoglobin A1c level checked twice a year.  Perform daily blood glucose monitoring as directed by your caregiver.  Monitor urine ketones when you are ill and as directed by your caregiver.  Take your diabetes medicine or insulin as directed by your caregiver  to maintain your blood glucose levels in the desired range.  Never run out of diabetes medicine or insulin. It is needed every day.  Adjust insulin based on your intake of carbohydrates. Carbohydrates can raise blood glucose levels but need to be included in your diet. Carbohydrates provide vitamins, minerals, and fiber which are an essential part of a healthy diet. Carbohydrates are found in fruits, vegetables, whole grains, dairy products, legumes, and foods containing added sugars.    Eat healthy foods. Alternate 3 meals with 3 snacks.  Lose weight if overweight.  Carry a medical alert  card or wear your medical alert jewelry.  Carry a 15 gram carbohydrate snack with you at all times to treat low blood glucose (hypoglycemia). Some examples of 15 gram carbohydrate snacks include:  Glucose tablets, 3 or 4   Glucose gel, 15 gram tube  Raisins, 2 tablespoons (24 grams)  Jelly beans, 6  Animal crackers, 8  Regular pop, 4 ounces (120 mL)  Gummy treats, 9  Recognize hypoglycemia. Hypoglycemia occurs with blood glucose levels of 70 mg/dL and below. The risk for hypoglycemia increases when fasting or skipping meals, during or after intense exercise, and during sleep. Hypoglycemia symptoms can include:  Tremors or shakes.  Decreased ability to concentrate.  Sweating.  Increased heart rate.  Headache.  Dry mouth.  Hunger.  Irritability.  Anxiety.  Restless sleep.  Altered speech or coordination.  Confusion.  Treat hypoglycemia promptly. If you are alert and able to safely swallow, follow the 15:15 rule:  Take 15 20 grams of rapid-acting glucose or carbohydrate. Rapid-acting options include glucose gel, glucose tablets, or 4 ounces (120 mL) of fruit juice, regular soda, or low fat milk.  Check your blood glucose level 15 minutes after taking the glucose.  Take 15 20 grams more of glucose if the repeat blood glucose level is still 70 mg/dL or below.  Eat a meal or snack within 1 hour once blood glucose levels return to normal.    Be alert to polyuria and polydipsia which are early signs of hyperglycemia. An early awareness of hyperglycemia allows for prompt treatment. Treat hyperglycemia as directed by your caregiver.  Engage in at least 150 minutes of moderate-intensity physical activity a week, spread over at least 3 days of the week or as directed by your caregiver. In addition, you should engage in resistance exercise at least 2 times a week or as directed by your caregiver.  Adjust your medicine and food intake as needed if you start a new  exercise or sport.  Follow your sick day plan at any time you are unable to eat or drink as usual.  Avoid tobacco use.  Limit alcohol intake to no more than 1 drink per day for nonpregnant women and 2 drinks per day for men. You should drink alcohol only when you are also eating food. Talk with your caregiver whether alcohol is safe for you. Tell your caregiver if you drink alcohol several times a week.  Follow up with your caregiver regularly.  Schedule an eye exam soon after the diagnosis of type 2 diabetes and then annually.  Perform daily skin and foot care. Examine your skin and feet daily for cuts, bruises, redness, nail problems, bleeding, blisters, or sores. A foot exam by a caregiver should be done annually.  Brush your teeth and gums at least twice a day and floss at least once a day. Follow up with your dentist regularly.  Share your diabetes management plan with  your workplace or school.  Stay up-to-date with immunizations.  Learn to manage stress.  Obtain ongoing diabetes education and support as needed.  Participate in, or seek rehabilitation as needed to maintain or improve independence and quality of life. Request a physical or occupational therapy referral if you are having foot or hand numbness or difficulties with grooming, dressing, eating, or physical activity. SEEK MEDICAL CARE IF:   You are unable to eat food or drink fluids for more than 6 hours.  You have nausea and vomiting for more than 6 hours.  Your blood glucose level is over 240 mg/dL.  There is a change in mental status.  You develop an additional serious illness.  You have diarrhea for more than 6 hours.  You have been sick or have had a fever for a couple of days and are not getting better.  You have pain during any physical activity.  SEEK IMMEDIATE MEDICAL CARE IF:  You have difficulty breathing.  You have moderate to large ketone levels. MAKE SURE YOU:  Understand these  instructions.  Will watch your condition.  Will get help right away if you are not doing well or get worse. Document Released: 05/16/2005 Document Revised: 02/08/2012 Document Reviewed: 12/13/2011 [facility] Patient Information 2014 [place], [place].

## 2012-12-03 NOTE — Progress Notes (Addendum)
Subjective:    Patient ID: [name] Newton, male    DOB: [birth year], 52 y.o.   MRN: [id number]  HPI Comments: 52 y.o PMH COPD, DM 2 uncontrolled associated with neuropathy (HA1C 12.5 today, cbg 336), HLD (uncontrolled) TC 309, TG 548, HDL 33, LDL 135.8, CAD with stenting, h/o MI, FH MI (dad, brother), fatty liver.  He presents for follow up for DM 2 which is uncontrolled and he has lower extremity numbness and tingling and pain.  He states he could not afford his cholesterol medication.  He c/o right shoulder pain radiating to right elbow 5/10 worse with twisting and turning arm but feeling better since onset yesterday.  He has multiple sores on his body (left ear, left arm, right thigh, left thigh, left buttock and ankle).  He states previously he took antibiotics which helped sores.  He asked for albuterol inhaler sample. BP is 113/74 today.  He has trouble affording DM meter and medications due to cost. No income currently brother is helping him out financially.  He is applying for disability.      He complains of decreased sleep, depression though he denies SI.  He states he has been in a psychiatric facility as a kid due to cutting his wrist.    Patient states he passed out and lost consciousness about 3-4 weeks ago and his brother found him in the yard will working.  Advised him to come to the ED if that happens again and seek medical attention.   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  Has multiple girlfriends. He denies alcohol or drugs currently     Review of Systems  Respiratory: Positive for shortness of breath.        Chronic sob esp with exertion h/o asthma as kid  Cardiovascular: Negative for chest pain and leg swelling.  Gastrointestinal: Positive for constipation. Negative for blood in stool.       Patient reports intermittent dark to black stools though last  fobt 07/2012 was negative.   Genitourinary: Negative for hematuria.       +erectile dysfunction  Skin: Positive for wound.       Objective:   Physical Exam  Nursing note and vitals reviewed. Constitutional: He is oriented to person, place, and time. Vital signs are normal. He appears well-developed and well-nourished. He is cooperative.  HENT:  Head: Normocephalic and atraumatic.  Mouth/Throat: Abnormal dentition. No oropharyngeal exudate.  Eyes: Conjunctivae are normal. Pupils are equal, round, and reactive to light. Right eye exhibits no discharge. Left eye exhibits no discharge. No scleral icterus.  Cardiovascular: Normal rate, regular rhythm, S1 normal, S2 normal and normal heart sounds.   No murmur heard. Pulmonary/Chest: Effort normal and breath sounds normal.  Abdominal: Soft. Bowel sounds are normal. He exhibits no distension. There is no tenderness.  Obese ab  Musculoskeletal: He exhibits no edema.  Neurological: He is alert and oriented to person, place, and time. Gait normal.  Skin: Skin is warm and dry.  Multiple sores, non oozing to left ear, left arm, right thigh, left thigh, left buttock, left ankle  Psychiatric: He has a normal mood and affect. His speech is normal and behavior is normal. Judgment and thought content normal. Cognition and memory are normal.          Assessment & Plan:  F/u in 1-3 months with PCP for DM check and  address chronic medical problems  Check sores to skin Will need referral to eye MD and colonoscopy in the future

## 2012-12-03 NOTE — Assessment & Plan Note (Signed)
History of MI with stent LDL not at goal  Patient unable to afford cholesterol med  Advised to start taking and called pharmacy and Lovastatin is on the $4 list which he can afford  Lipid Panel     Component Value Date/Time   CHOL 309* 06/25/2012 1021   TRIG 548.0 Triglyceride is over 400; calculations on Lipids are invalid.* 06/25/2012 1021   HDL 33.30* 06/25/2012 1021   CHOLHDL 9 06/25/2012 1021   VLDL 109.6* 06/25/2012 1021   LDLCALC  Value: UNABLE TO CALCULATE IF TRIGLYCERIDE OVER 400 mg/dL        Total Cholesterol/HDL:CHD Risk Coronary Heart Disease Risk Table                     Men   Women  1/2 Average Risk   3.4   3.3  Average Risk       5.0   4.4  2 X Average Risk   9.6   7.1  3 X Average Risk  23.4   11.0        Use the calculated Patient Ratio above and the CHD Risk Table to determine the patient's CHD Risk.        ATP III CLASSIFICATION (LDL):  <100     mg/dL   Optimal  409-811  mg/dL   Near or Above                    Optimal  130-159  mg/dL   Borderline  914-782  mg/dL   High  >956     mg/dL   Very High 07/13/863 7846

## 2012-12-03 NOTE — Assessment & Plan Note (Signed)
Given Bactrim ds bid x 7 days. Sores dont appear grossly infected though with uncontrolled DM want to make sure treat if there is any infection so DM not made any worse and so sores will heal.

## 2012-12-03 NOTE — Assessment & Plan Note (Signed)
Given sample albuterol ventolin HFA [id number] 10/2013 today

## 2012-12-03 NOTE — Assessment & Plan Note (Addendum)
Lab Results  Component Value Date   HGBA1C 12.5 12/03/2012   HGBA1C 11.4 07/04/2012   HGBA1C  Value: 8.5 (NOTE)                                                                       According to the ADA Clinical Practice Recommendations for 2011, when HbA1c is used as a screening test:   >=6.5%   Diagnostic of Diabetes Mellitus           (if abnormal result  is confirmed)  5.7-6.4%   Increased risk of developing Diabetes Mellitus  References:Diagnosis and Classification of Diabetes Mellitus,Diabetes Care,2011,34(Suppl 1):S62-S69 and Standards of Medical Care in         Diabetes - 2011,Diabetes Care,2011,34  (Suppl 1):S11-S61.* 09/22/2010     Assessment: Diabetes control: poor control (HgbA1C >9%) Progress toward A1C goal:  deteriorated Comments: associated with neuropathy and ED   Plan: Medications:  increased Metformin to 1000 mg bid. Added Glipizide 5 mg qam. Patient states he was previously taking Glipizine a while ago but was taken off. He cant afford DM meter and he is afraid of needles.   Home glucose monitoring: cant afford meter at this time Frequency:   Timing:   Instruction/counseling given: reminded to get eye exam Educational resources provided: brochure;handout Self management tools provided:   Other plans: will need referral to opthalmology in the future. Will have him meet with Lupita Leash in the future

## 2012-12-03 NOTE — Assessment & Plan Note (Signed)
  Assessment: Progress toward smoking cessation:  smoking less Barriers to progress toward smoking cessation:   (duration of smoking) Comments: none  Plan: Instruction/counseling given:  I counseled patient on the dangers of tobacco use, advised patient to stop smoking, and reviewed strategies to maximize success. Educational resources provided:  [employer], [employer]ellery) brochure Self management tools provided:    Medications to assist with smoking cessation: none Patient agreed to the following self-care plans for smoking cessation: call [employer] (1-800-QUIT-NOW)  Other plans: given handout on smoking cessation

## 2012-12-04 ENCOUNTER — Encounter: Payer: Self-pay | Admitting: Internal Medicine

## 2012-12-04 LAB — HEPATITIS PANEL, ACUTE
HCV Ab: NEGATIVE
Hep A IgM: NEGATIVE
Hep B C IgM: NEGATIVE
Hepatitis B Surface Ag: NEGATIVE

## 2012-12-06 ENCOUNTER — Other Ambulatory Visit: Payer: Self-pay

## 2012-12-06 NOTE — Progress Notes (Signed)
Case discussed with Dr. McLean soon after the resident saw the patient.  We reviewed the resident's history and exam and pertinent patient test results.  I agree with the assessment, diagnosis, and plan of care documented in the resident's note. 

## 2012-12-07 ENCOUNTER — Other Ambulatory Visit: Payer: Self-pay | Admitting: Dietician

## 2012-12-07 DIAGNOSIS — E1165 Type 2 diabetes mellitus with hyperglycemia: Secondary | ICD-10-CM

## 2012-12-07 MED ORDER — BLOOD GLUCOSE MONITORING SUPPL DEVI: Status: DC

## 2012-12-07 MED ORDER — GLUCOSE BLOOD VI STRP: ORAL_STRIP | Status: DC

## 2012-12-07 NOTE — Progress Notes (Signed)
Please sign referral in this note. Thanks

## 2012-12-07 NOTE — Telephone Encounter (Signed)
Patient requested a meter and strips to check his blood sugar. Schedule an appointment with CDE for next Friday

## 2012-12-14 ENCOUNTER — Ambulatory Visit: Payer: No Typology Code available for payment source | Admitting: Dietician

## 2012-12-20 ENCOUNTER — Encounter: Payer: No Typology Code available for payment source | Admitting: Dietician

## 2012-12-25 ENCOUNTER — Ambulatory Visit (INDEPENDENT_AMBULATORY_CARE_PROVIDER_SITE_OTHER): Payer: No Typology Code available for payment source | Admitting: Dietician

## 2012-12-25 ENCOUNTER — Encounter: Payer: Self-pay | Admitting: Dietician

## 2012-12-25 VITALS — Wt 221.2 lb

## 2012-12-25 DIAGNOSIS — IMO0001 Reserved for inherently not codable concepts without codable children: Secondary | ICD-10-CM

## 2012-12-25 DIAGNOSIS — E1165 Type 2 diabetes mellitus with hyperglycemia: Secondary | ICD-10-CM

## 2012-12-25 NOTE — Patient Instructions (Addendum)
Your plan to lower your blood sugars made at today's visit:  Start taking glipizide every day. Follow up to recheck blood sugar in 2-4 weeks.  Update your ORANGE CARD.Research scientist (physical sciences) is [place], [phone])  [facility] told me they'd give you a meter when you buy strips with your orange card for 6$. They are located at [address]. Their phone number is [phone].  Lupita Leash Geralynn Capri Diabetes Educator, [phone]

## 2012-12-25 NOTE — Progress Notes (Signed)
Diabetes Self-Management Education  Visit Number: First/Initial  12/25/2012 [name], identified by name and date of birth, is a 52 y.o. male with Diabetes Type: Type 2.      ASSESSMENT  Patient Concerns:  Glycemic Control  Weight 221 lb 3.2 oz (100.336 kg). Body mass index is 27.27 kg/(m^2).  Lab Results: LDL Cholesterol  Date Value Range Status  09/22/2010 UNABLE TO CALCULATE IF TRIGLYCERIDE OVER 400 mg/dL        Total Cholesterol/HDL:CHD Risk Coronary Heart Disease Risk Table                     Men   Women  1/2 Average Risk   3.4   3.3  Average Risk       5.0   4.4  2 X Average Risk   9.6   7.1  3 X Average Risk  23.4   11.0        Use the calculated Patient Ratio above and the CHD Risk Table to determine the patient's CHD Risk.        ATP III CLASSIFICATION (LDL):  <100     mg/dL   Optimal  578-469  mg/dL   Near or Above                    Optimal  130-159  mg/dL   Borderline  629-528  mg/dL   High  >413     mg/dL   Very High  0 - 99 mg/dL Final     Hemoglobin K4M  Date Value Range Status  12/03/2012 12.5   Final  09/22/2010 8.5 (NOTE)                                                                       According to the ADA Clinical Practice Recommendations for 2011, when HbA1c is used as a screening test:   >=6.5%   Diagnostic of Diabetes Mellitus           (if abnormal result  is confirmed)  5.7-6.4%   Increased risk of developing Diabetes Mellitus  References:Diagnosis and Classification of Diabetes Mellitus,Diabetes Care,2011,34(Suppl 1):S62-S69 and Standards of Medical Care in         Diabetes - 2011,Diabetes Care,2011,34  (Suppl 1):S11-S61.* <5.7 % Final     Family History  Problem Relation Age of Onset  . Heart attack Father   . Coronary artery disease Brother   . Heart attack Brother    History  Substance Use Topics  . Smoking status: Current Every Day Smoker -- 0.50 packs/day    Types: Cigarettes    Last Attempt to Quit: 05/03/2012  . Smokeless tobacco:  Not on file     Comment: Patches do not work  . Alcohol Use: No    Support Systems:  siblings Special Needs:  Simplified materials  Prior DM Education:   yes at [employer] when diagnosed 2 years ago. Also went to [employer] to get information about diabetes Daily Foot Exams: Yes Patient Belief / Attitude about Diabetes:  Diabetes can be controlled;No matter what I do I can't control my blood glucose levels  Assessment comments: patient  Reports he tries to walk, very hungry, tired, feet painful. verbalizes  frustration that he cannot get disability and is having a difficult time affording his medications. Does not want to learn about insulin today. After breakfast CBG in office today was 439. Patient appropriately concerned.   Diet Recall: Beverages- water, unsweet tea and occasional diet dr. Reino Kent, breakfast- egg whites, fruit, water, snack- fruit and store bought peanut butter crackers, Lunch- tomato sandwich, snack- fruit, peanut butter crackers    Individualized Plan for Diabetes Self-Management Training:  Patient individualized diabetes plan discussed today with patient and includes: what is Diabetes, medications, monitoring, physical activity, how to handle highs and lows, Special care for my body when I have diabetes, Dealing daily with diabetes  Education Topics Reviewed with Patient Today:  Topic Points Discussed  Disease State Factors that contribute to the development of diabetes;Explored patient's options for treatment of their diabetes  Nutrition Management    Physical Activity and Exercise    Medications Reviewed patients medication for diabetes, action, purpose, timing of dose and side effects.  Monitoring Identified appropriate SMBG and A1C goals.  Acute Complications    Chronic Complications    Psychosocial Adjustment    Goal Setting    Preconception Care (if applicable)      PATIENTS GOALS   Learning Objective(s):     Goal The patient agrees to:  Nutrition     Physical Activity    Medications take my medication as prescribed  Monitoring    Problem Solving    Reducing Risk    Health Coping     Patient Self-Evaluation of Goals (Subsequent Visits)  Goal The patient rates self as meeting goals (% of time)  Nutrition    Physical Activity    Medications    Monitoring    Problem Solving    Reducing Risk    Health Coping       PERSONALIZED PLAN / SUPPORT  Self-Management Support:  Doctor's office;Family;CDE visits ______________________________________________________________________  Outcomes  Expected Outcomes:  Demonstrated interest in learning. Expect positive outcomes Self-Care Barriers:  Low literacy;Lack of transportation;Lack of material resources Education material provided: patient denied need/desire If problems or questions, patient to contact team via:  Phone  Time in: 1030     Time out: 1100  Future DSME appointment: - 4-6 wks   Uldine Fuster, Lupita Leash 12/25/2012 11:11 AM

## 2013-01-11 NOTE — Addendum Note (Signed)
Addended by: Emily Massar N on: 01/11/2013 11:48 AM   Modules accepted: Orders  

## 2013-01-11 NOTE — Addendum Note (Signed)
Addended by: Bufford Spikes on: 01/11/2013 11:48 AM   Modules accepted: Orders

## 2013-03-25 ENCOUNTER — Other Ambulatory Visit: Payer: Self-pay | Admitting: Internal Medicine

## 2013-03-25 ENCOUNTER — Other Ambulatory Visit: Payer: Self-pay | Admitting: *Deleted

## 2013-03-25 DIAGNOSIS — E1140 Type 2 diabetes mellitus with diabetic neuropathy, unspecified: Secondary | ICD-10-CM

## 2013-03-25 DIAGNOSIS — I2510 Atherosclerotic heart disease of native coronary artery without angina pectoris: Secondary | ICD-10-CM

## 2013-03-25 DIAGNOSIS — E1165 Type 2 diabetes mellitus with hyperglycemia: Secondary | ICD-10-CM

## 2013-03-25 DIAGNOSIS — IMO0002 Reserved for concepts with insufficient information to code with codable children: Secondary | ICD-10-CM

## 2013-03-25 DIAGNOSIS — J449 Chronic obstructive pulmonary disease, unspecified: Secondary | ICD-10-CM

## 2013-03-25 DIAGNOSIS — E785 Hyperlipidemia, unspecified: Secondary | ICD-10-CM

## 2013-03-25 MED ORDER — ALBUTEROL SULFATE HFA 108 (90 BASE) MCG/ACT IN AERS
2.0000 | INHALATION_SPRAY | Freq: Four times a day (QID) | RESPIRATORY_TRACT | Status: DC | PRN
Start: 2013-03-25 — End: 2013-03-25

## 2013-03-25 MED ORDER — GLIPIZIDE 5 MG PO TABS: 5.0000 mg | ORAL_TABLET | Freq: Every day | ORAL | Status: DC

## 2013-03-25 MED ORDER — METFORMIN HCL 1000 MG PO TABS: 1000.0000 mg | ORAL_TABLET | Freq: Two times a day (BID) | ORAL | Status: DC

## 2013-03-25 MED ORDER — GABAPENTIN 300 MG PO CAPS: 300.0000 mg | ORAL_CAPSULE | Freq: Three times a day (TID) | ORAL | Status: DC

## 2013-03-25 MED ORDER — LOVASTATIN 20 MG PO TABS: 20.0000 mg | ORAL_TABLET | Freq: Every day | ORAL | Status: DC

## 2013-03-25 MED ORDER — ALBUTEROL SULFATE HFA 108 (90 BASE) MCG/ACT IN AERS: 2.0000 | INHALATION_SPRAY | Freq: Four times a day (QID) | RESPIRATORY_TRACT | Status: DC | PRN

## 2013-03-25 MED ORDER — FLUTICASONE-SALMETEROL 100-50 MCG/DOSE IN AEPB: 1.0000 | INHALATION_SPRAY | Freq: Two times a day (BID) | RESPIRATORY_TRACT | Status: DC

## 2013-03-25 NOTE — Telephone Encounter (Signed)
Rxs were refilled and sent electronically per Dr Zada Girt.

## 2013-04-02 ENCOUNTER — Encounter: Payer: Self-pay | Admitting: *Deleted

## 2013-04-02 NOTE — Progress Notes (Signed)
Pt came by the clinic today asking for his PCP to fill out a form stating he was competent to handle his own financeses. He stated [employer] required this along with Medicaid. I had Shana,  CSW talk with pt and she offered to call SS with the patient to see just exactly what was needed and why. Pt refused and walked out of the clinic.   He was not able to state why he was listed as not being able to handle his finances.

## 2013-04-04 ENCOUNTER — Other Ambulatory Visit: Payer: Self-pay

## 2013-06-03 ENCOUNTER — Telehealth: Payer: Self-pay | Admitting: *Deleted

## 2013-06-03 ENCOUNTER — Encounter: Payer: Self-pay | Admitting: Internal Medicine

## 2013-06-03 ENCOUNTER — Ambulatory Visit (INDEPENDENT_AMBULATORY_CARE_PROVIDER_SITE_OTHER): Payer: PRIVATE HEALTH INSURANCE | Admitting: Internal Medicine

## 2013-06-03 VITALS — BP 110/81 | HR 79 | Temp 96.9°F | Ht 76.0 in | Wt 218.1 lb

## 2013-06-03 DIAGNOSIS — IMO0002 Reserved for concepts with insufficient information to code with codable children: Secondary | ICD-10-CM

## 2013-06-03 DIAGNOSIS — J069 Acute upper respiratory infection, unspecified: Secondary | ICD-10-CM

## 2013-06-03 DIAGNOSIS — IMO0001 Reserved for inherently not codable concepts without codable children: Secondary | ICD-10-CM

## 2013-06-03 DIAGNOSIS — E1165 Type 2 diabetes mellitus with hyperglycemia: Secondary | ICD-10-CM

## 2013-06-03 DIAGNOSIS — J449 Chronic obstructive pulmonary disease, unspecified: Secondary | ICD-10-CM

## 2013-06-03 DIAGNOSIS — R6889 Other general symptoms and signs: Secondary | ICD-10-CM

## 2013-06-03 LAB — POCT GLYCOSYLATED HEMOGLOBIN (HGB A1C): Hemoglobin A1C: >14

## 2013-06-03 LAB — GLUCOSE, CAPILLARY: Glucose-Capillary: 296 mg/dL — ABNORMAL HIGH (ref 70–99)

## 2013-06-03 MED ORDER — BENZONATATE 200 MG PO CAPS: 200.0000 mg | ORAL_CAPSULE | Freq: Three times a day (TID) | ORAL | Status: DC | PRN

## 2013-06-03 MED ORDER — GUAIFENESIN ER 600 MG PO TB12: 600.0000 mg | ORAL_TABLET | Freq: Two times a day (BID) | ORAL | Status: DC

## 2013-06-03 MED ORDER — DEXTROMETHORPHAN-GUAIFENESIN 10-100 MG/5ML PO LIQD: 10.0000 mL | ORAL | Status: DC | PRN

## 2013-06-03 MED ORDER — FLUTICASONE-SALMETEROL 100-50 MCG/DOSE IN AEPB: 1.0000 | INHALATION_SPRAY | Freq: Two times a day (BID) | RESPIRATORY_TRACT | Status: DC

## 2013-06-03 MED ORDER — ALBUTEROL SULFATE HFA 108 (90 BASE) MCG/ACT IN AERS: 2.0000 | INHALATION_SPRAY | Freq: Four times a day (QID) | RESPIRATORY_TRACT | Status: DC | PRN

## 2013-06-03 MED ORDER — OSELTAMIVIR PHOSPHATE 75 MG PO CAPS: 75.0000 mg | ORAL_CAPSULE | Freq: Two times a day (BID) | ORAL | Status: AC

## 2013-06-03 NOTE — Telephone Encounter (Signed)
Pt called since Firday chills and hot feeling and h/a. Unable to check CBG or temperature. N/V are better now. Pt thinks he has the flu Appt given today at 2:30PM Dr Burtis JunesSadek.Stanton Kidney. Jarmaine Ehrler RN 06/03/13 10:10AM

## 2013-06-03 NOTE — Patient Instructions (Signed)
It was a pleasure to meet you and sorry you are not feeling well.   In terms of managing your flu:  -tussin-DM cough syrup -tessalon pearls or mucinex for the mucus  -Tamiflu  -inhalers -DRINK PLENTY OF FLUIDS and getting plenty of rest!

## 2013-06-04 DIAGNOSIS — R6889 Other general symptoms and signs: Secondary | ICD-10-CM | POA: Insufficient documentation

## 2013-06-04 NOTE — Assessment & Plan Note (Signed)
This was not directly addressed during this visit but HgbA1c was signifiacantly elevated to >14 from 12.5 6 months ago.  -pt is on metformin 1000mg  BID and glipizide 5mg  qd unknown if still compliant given recent illness and also such significant increase in A1c despite being on same meds -may need to evaluate for insulin  -pt wanted to follow up once current illness resolved -pt was educated about warning symptoms that would warrant immediate evaluation

## 2013-06-04 NOTE — Assessment & Plan Note (Signed)
Doesn't appear to be in exacerbation. Will continue current management.

## 2013-06-04 NOTE — Assessment & Plan Note (Addendum)
Pt with myalgias and arthralgias. No significant wheezing or other PE findings to suggest PNA or COPD exacerbation. Starting to resolve. Pt didn't receive flu shot this year and had other sick contacts with similar URI symptoms.  -symptomatic supportive management with tamiflu given high risk or complications, tessalon pearls, tussin-DM, and mucinex

## 2013-06-04 NOTE — Progress Notes (Signed)
Subjective:    Patient ID: [name] Newton, male    DOB: April 21, 1961, 53 y.o.   MRN: [id number]  Cough Associated symptoms include chills, a fever, myalgias, postnasal drip and rhinorrhea. Pertinent negatives include no chest pain, ear pain, headaches, rash, sore throat or shortness of breath.  URI  Associated symptoms include congestion, coughing, diarrhea, nausea and rhinorrhea. Pertinent negatives include no abdominal pain, chest pain, ear pain, headaches, rash, sneezing, sore throat or vomiting.   [name] is a 53 yo man pmh as listed below presenting for flu like symptoms.   Pt days the symptoms have been going on for about 3 days. This started out with fever, chills, myalgias and arthralgias and then were followed by a nonproductive cough. Patient has been also having some nausea and diarrhea during the last 24 hours but able to still keep adequate by mouth intake. The patient has tried over-the-counter Alka-Seltzer a.m. And p.m. And Therma flu with some relief. The patient has also been sleeping more that helps him feel better. The patient has not necessarily needed to increase his rescue inhaler use. The patient is a smoker and usually smokes a half pack per day but has not been able to for the past 3 days not secondary to feelings of shortness of breath but secondary to feelings of fatigue. Over the holidays there have been some sick contacts with slight URI symptoms and the patient did not receive a flu shot this year.  In terms of his diabetes he states that his "regular or normal" CBG readings are anywhere between a 4-to 600 range and he states he has been compliant with his medication. He doesn't really describe any polydipsia or polyuria.   Review of Systems  Constitutional: Positive for fever, chills, activity change (decreased with mostly sleeping over 3 days) and fatigue. Negative for appetite change.  HENT: Positive for congestion, postnasal drip and rhinorrhea.  Negative for ear discharge, ear pain, hearing loss, sinus pressure, sneezing, sore throat and tinnitus.   Respiratory: Positive for cough. Negative for choking, chest tightness and shortness of breath.   Cardiovascular: Negative for chest pain, palpitations and leg swelling.  Gastrointestinal: Positive for nausea and diarrhea. Negative for vomiting and abdominal pain.  Endocrine: Negative for polydipsia and polyuria.  Musculoskeletal: Positive for arthralgias and myalgias.  Skin: Negative for rash.  Neurological: Negative for dizziness, weakness, light-headedness and headaches.   Past Medical History  Diagnosis Date  . Diabetes mellitus   . Tobacco abuse   . Hyperlipidemia   . Hypertension   . NSTEMI (non-ST elevated myocardial infarction) April 2012    status post successful PCI and bare-metal  stenting of the first diagonal this admission.   . Guaiac positive stools      Guaiac positive stool with stable H and H.        Objective:   Physical Exam Filed Vitals:   06/03/13 1437  BP: 110/81  Pulse: 79  Temp: 96.9 F (36.1 C)   General:uncomfortable but not in distress HEENT: PERRL, EOMI, no scleral icterus, clear fluid behind TM bilaterally no frank pus or ertythema Cardiac: RRR, no rubs, murmurs or gallops Pulm: clear to auscultation bilaterally some intermittent wheezing no crackles or rales, moving normal volumes of air Abd: soft, nontender, nondistended, BS present Ext: warm and well perfused, no pedal edema Neuro: alert and oriented X3, cranial nerves II-XII grossly intact     Assessment & Plan:  Please see problem oriented charting.   Pt discussed  with Dr. Meredith Pel

## 2013-06-07 NOTE — Progress Notes (Signed)
Case discussed with Dr. Sadek soon after the resident saw the patient.  We reviewed the resident's history and exam and pertinent patient test results.  I agree with the assessment, diagnosis, and plan of care documented in the resident's note. 

## 2013-06-24 ENCOUNTER — Other Ambulatory Visit: Payer: Self-pay | Admitting: *Deleted

## 2013-06-24 DIAGNOSIS — IMO0002 Reserved for concepts with insufficient information to code with codable children: Secondary | ICD-10-CM

## 2013-06-24 DIAGNOSIS — I2510 Atherosclerotic heart disease of native coronary artery without angina pectoris: Secondary | ICD-10-CM

## 2013-06-24 DIAGNOSIS — J449 Chronic obstructive pulmonary disease, unspecified: Secondary | ICD-10-CM

## 2013-06-24 DIAGNOSIS — E1165 Type 2 diabetes mellitus with hyperglycemia: Secondary | ICD-10-CM

## 2013-06-24 DIAGNOSIS — E1140 Type 2 diabetes mellitus with diabetic neuropathy, unspecified: Secondary | ICD-10-CM

## 2013-06-24 DIAGNOSIS — E785 Hyperlipidemia, unspecified: Secondary | ICD-10-CM

## 2013-06-24 MED ORDER — METFORMIN HCL 1000 MG PO TABS: 1000.0000 mg | ORAL_TABLET | Freq: Two times a day (BID) | ORAL | Status: DC

## 2013-06-24 MED ORDER — ALBUTEROL SULFATE HFA 108 (90 BASE) MCG/ACT IN AERS: 2.0000 | INHALATION_SPRAY | Freq: Four times a day (QID) | RESPIRATORY_TRACT | Status: DC | PRN

## 2013-06-24 MED ORDER — ACCU-CHEK AVIVA PLUS W/DEVICE KIT: 1.0000 | PACK | Freq: Every day | Status: DC

## 2013-06-24 MED ORDER — GLIPIZIDE 5 MG PO TABS: 5.0000 mg | ORAL_TABLET | Freq: Every day | ORAL | Status: DC

## 2013-06-24 MED ORDER — LOVASTATIN 20 MG PO TABS: 20.0000 mg | ORAL_TABLET | Freq: Every day | ORAL | Status: DC

## 2013-06-24 MED ORDER — GABAPENTIN 300 MG PO CAPS: 300.0000 mg | ORAL_CAPSULE | Freq: Three times a day (TID) | ORAL | Status: DC

## 2013-06-24 NOTE — Telephone Encounter (Signed)
Pt is changing pharmacy's and wants glucose meter and test strips sent in.  They cover accu chek.

## 2013-06-26 ENCOUNTER — Telehealth: Payer: Self-pay | Admitting: *Deleted

## 2013-06-26 DIAGNOSIS — J449 Chronic obstructive pulmonary disease, unspecified: Secondary | ICD-10-CM

## 2013-06-26 NOTE — Telephone Encounter (Signed)
Call from pt stating insurance will not pay for his Albuterol HFA.   Contacted pt's insurance at [phone] to initiate PA.  Ventolin and Albuterol are non-preferred.  They will pay for Proair HFA.  Verbal order given over the phone to change to proair.  Pt also requested that his rx for advair diskus 100-50 be called into [facility] mailorder.  Advair called in and attempted to change albuterol to Proair in chart , but it kept changing back to albuterol hfa.. Will forward info to pcp to make them aware.[name] Newton, [name] Woodrick Cassady1/28/20151:19 PM

## 2013-07-24 ENCOUNTER — Ambulatory Visit: Payer: PRIVATE HEALTH INSURANCE | Admitting: Internal Medicine

## 2013-07-24 ENCOUNTER — Encounter: Payer: Self-pay | Admitting: Internal Medicine

## 2013-07-24 ENCOUNTER — Ambulatory Visit (INDEPENDENT_AMBULATORY_CARE_PROVIDER_SITE_OTHER): Payer: PRIVATE HEALTH INSURANCE | Admitting: Internal Medicine

## 2013-07-24 VITALS — BP 122/87 | HR 83 | Temp 97.2°F | Ht 76.0 in | Wt 218.4 lb

## 2013-07-24 DIAGNOSIS — K219 Gastro-esophageal reflux disease without esophagitis: Secondary | ICD-10-CM

## 2013-07-24 DIAGNOSIS — Z720 Tobacco use: Secondary | ICD-10-CM

## 2013-07-24 DIAGNOSIS — IMO0002 Reserved for concepts with insufficient information to code with codable children: Secondary | ICD-10-CM

## 2013-07-24 DIAGNOSIS — E1149 Type 2 diabetes mellitus with other diabetic neurological complication: Secondary | ICD-10-CM

## 2013-07-24 DIAGNOSIS — R109 Unspecified abdominal pain: Secondary | ICD-10-CM | POA: Insufficient documentation

## 2013-07-24 DIAGNOSIS — E1165 Type 2 diabetes mellitus with hyperglycemia: Secondary | ICD-10-CM

## 2013-07-24 DIAGNOSIS — E119 Type 2 diabetes mellitus without complications: Secondary | ICD-10-CM

## 2013-07-24 DIAGNOSIS — E1142 Type 2 diabetes mellitus with diabetic polyneuropathy: Secondary | ICD-10-CM

## 2013-07-24 DIAGNOSIS — J449 Chronic obstructive pulmonary disease, unspecified: Secondary | ICD-10-CM

## 2013-07-24 HISTORY — DX: Gastro-esophageal reflux disease without esophagitis: K21.9

## 2013-07-24 LAB — GLUCOSE, CAPILLARY: Glucose-Capillary: 482 mg/dL — ABNORMAL HIGH (ref 70–99)

## 2013-07-24 MED ORDER — GLYBURIDE 1.25 MG PO TABS: 2.5000 mg | ORAL_TABLET | Freq: Every day | ORAL | Status: DC

## 2013-07-24 MED ORDER — GLYBURIDE 2.5 MG PO TABS: 2.5000 mg | ORAL_TABLET | Freq: Every day | ORAL | Status: DC

## 2013-07-24 MED ORDER — VARENICLINE TARTRATE 0.5 MG PO TABS: 0.5000 mg | ORAL_TABLET | Freq: Two times a day (BID) | ORAL | Status: DC

## 2013-07-24 MED ORDER — GLUCOSE BLOOD VI STRP: ORAL_STRIP | Status: DC

## 2013-07-24 MED ORDER — ACCU-CHEK MULTICLIX LANCETS MISC: Status: DC

## 2013-07-24 MED ORDER — ESOMEPRAZOLE MAGNESIUM 20 MG PO CPDR: 20.0000 mg | DELAYED_RELEASE_CAPSULE | Freq: Every day | ORAL | Status: DC

## 2013-07-24 MED ORDER — TIOTROPIUM BROMIDE MONOHYDRATE 18 MCG IN CAPS: 18.0000 ug | ORAL_CAPSULE | Freq: Every day | RESPIRATORY_TRACT | Status: DC

## 2013-07-24 NOTE — Assessment & Plan Note (Signed)
Will start Nexium 20 mg daily.

## 2013-07-24 NOTE — Assessment & Plan Note (Signed)
The patient initially complains of right lower chest pain, but detailed questioning reveals that he has right upper quadrant abdominal pain, which may have radiated to his lower chest. It is likely due to gallbladder issue, such as gallstone given his nausea and tenderness on the physical examination. Currently no signs of cholecystitis giving no fever or chills. Other differential diagnoses include PE, which is less likely. He was initially tachycardia when he came in to the clinic, but the repeated measurement of the heart rate after rest showed no tachycardia. Although patient initially complained of right lower chest pain, it is mostly likely due to gallbladder issue or coughing. His Well's score is low probability.   -will get abdominal ultrasound -Treat GERD with Nexium

## 2013-07-24 NOTE — Patient Instructions (Addendum)
1. Please stop taking glipizide and start taking glyburide 1.25 mg daily. 2. Please measure your blood sugar level ideally 3 times a day. If it is too difficult for you, please measure your blood sugar level once a day, and 3 times a day for at least 3 days before coming back for next visit. 3. Please take Nexium 20 mg daily for your heartburn 4. Please start using spirava inhaler for your COPD and shortness of breath. 5. If you have worsening of your symptoms or new symptoms arise, please call the clinic [phone]), or go to the ER immediately if symptoms are severe.  You have done great job in taking all your medications. I appreciate it very much. Please continue doing that.  Please bring in all your medication bottles with you in next visit.

## 2013-07-24 NOTE — Progress Notes (Signed)
Patient ID: [name] Newton, male   DOB: 08-26-1960, 53 y.o.   MRN: [id number] Subjective:   Patient ID: [name] Newton male   DOB: 1960-07-28 53 y.o.   MRN: [id number]  CC:   Follow up visit.          HPI:  [name] is a 53 y.o. man with past medical history as outlined below, who presents for a followup visit today.  1. DM-II: the patient was supposed to take a metformin 1000 mg twice a day and glipizide 5 mg daily. He is only taking metformin currently. He said he felt sick after taking glipizide. He felt nauseated and very tired after taking glipizide. He denies other symptoms for hypoglycemia, such as hand shaking, sweating, palpitation or confusion. His A1c has been worsening from 12.5 on 12/03/12 to>14 on 06/03/13. He did not measure his blood sugar level, saying that he did not have strips.   2. COPD:  Patient has a mild shortness of breath in the past 4 months. He also has cough with little greenish colored sputum. He reports having chest pain which is located at right lower chest, just above the rib cage. The chest is mild, which is aggravated by coughing, but not by exertion. He does not have fever or chills. He does not have pain over the calf areas. He did not have history of recent long distance traveling. His heart rate was 104 initially when he came in to the clinic, but repeated the measurement of the heart rate was 83/min after he had a rest.   3. RUQ abdominal pain: patient initially complains of left lower chest pain, but detailed questioning reveals that he has right upper quadrant abdominal pain, which may have radiated to his lower chest. His pain has been going on for more than one year. Currently the pain is mild, 3/10 in severity, radiating to the right lower chest. Sometimes he feels nauseated after eating food. He also has symptoms of GERD, including heartburn and substernal area. Patient does not have fever, chills, vomiting or diarrhea.   4.  Tobacco abuse: the patient used to smoke one pack a day for more than 35 years. Recently cut down to 0.5 packs a day. He tried nicotine patch without help on his smoking quitting. He heard from his friend that Chantix may be good for him. He wants to have a prescription for Chantix.   ROS:  Has nausea sometimes, heartburn, chest pain and abdominal pain. Denies fever, chills, fatigue, headaches, diarrhea, constipation, dysuria, urgency, frequency, hematuria, joint pain or leg swelling.   Past Medical History  Diagnosis Date  . Diabetes mellitus   . Tobacco abuse   . Hyperlipidemia   . Hypertension   . NSTEMI (non-ST elevated myocardial infarction) April 2012    status post successful PCI and bare-metal  stenting of the first diagonal this admission.   . Guaiac positive stools      Guaiac positive stool with stable H and H.    Current Outpatient Prescriptions  Medication Sig Dispense Refill  . albuterol (PROVENTIL HFA;VENTOLIN HFA) 108 (90 BASE) MCG/ACT inhaler Inhale 2 puffs into the lungs every 6 (six) hours as needed for wheezing.  1 Inhaler  2  . aspirin 81 MG tablet Take 81 mg by mouth daily as needed.       . Blood Glucose Monitoring Suppl (ACCU-CHEK AVIVA PLUS) W/DEVICE KIT 1 Syringe by Does not apply route daily.  1 kit  0  .  Blood Glucose Monitoring Suppl DEVI Check blood glucose 3x per day  1 each  0  . Fluticasone-Salmeterol (ADVAIR DISKUS) 100-50 MCG/DOSE AEPB Inhale 1 puff into the lungs every 12 (twelve) hours.  14 each  3  . gabapentin (NEURONTIN) 300 MG capsule Take 1 capsule (300 mg total) by mouth 3 (three) times daily.  90 capsule  6  . glucose blood test strip Use to check blood sugar up to 1 times a day before  Meals Dx code- 250.00  100 each  12  . lovastatin (MEVACOR) 20 MG tablet Take 1 tablet (20 mg total) by mouth at bedtime.  90 tablet  3  . metFORMIN (GLUCOPHAGE) 1000 MG tablet Take 1 tablet (1,000 mg total) by mouth 2 (two) times daily with a meal.  120 tablet  6   . nitroGLYCERIN (NITROSTAT) 0.4 MG SL tablet Place 1 tablet (0.4 mg total) under the tongue every 5 (five) minutes as needed for chest pain.  30 tablet  1  . esomeprazole (NEXIUM) 20 MG capsule Take 1 capsule (20 mg total) by mouth daily.  30 capsule  1  . glucose blood test strip Use as instructed  100 each  12  . glyBURIDE (DIABETA) 1.25 MG tablet Take 2 tablets (2.5 mg total) by mouth daily with breakfast.  30 tablet  1  . Lancets (ACCU-CHEK MULTICLIX) lancets Use as instructed  100 each  12  . tiotropium (SPIRIVA HANDIHALER) 18 MCG inhalation capsule Place 1 capsule (18 mcg total) into inhaler and inhale daily.  30 capsule  3  . varenicline (CHANTIX) 0.5 MG tablet Take 1 tablet (0.5 mg total) by mouth 2 (two) times daily.  60 tablet  1   No current facility-administered medications for this visit.   Family History  Problem Relation Age of Onset  . Heart attack Father   . Coronary artery disease Brother   . Heart attack Brother    History   Social History  . Marital Status: Divorced    Spouse Name: N/A    Number of Children: N/A  . Years of Education: 10   Occupational History  . none     Unemployed since 2012   Social History Main Topics  . Smoking status: Current Every Day Smoker -- 0.50 packs/day    Types: Cigarettes    Last Attempt to Quit: 05/03/2012  . Smokeless tobacco: None     Comment: Patches do not work.  Cutting back.  Wants Chantix  . Alcohol Use: No  . Drug Use: No  . Sexual Activity: None   Other Topics Concern  . None   Social History Narrative   Patient is intermittently homeless without any income at the moment.           Review of Systems: Full 14-point review of systems otherwise negative. See HPI.   Objective:  Physical Exam: Filed Vitals:   07/24/13 1535 07/24/13 1647  BP: 122/87   Pulse: 104 83  Temp: 97.2 F (36.2 C)   TempSrc: Oral   Height: _0  (1.93 m)   Weight: 218 lb 6.4 oz (99.066 kg)   SpO2: 98%    Constitutional:  Vital signs reviewed.  Patient is a well-developed and well-nourished, in no acute distress and cooperative with exam.   HEENT:  Head: Normocephalic and atraumatic Mouth: no erythema or exudates, MMM Eyes: PERRL, EOMI, conjunctivae normal, No scleral icterus.  Neck: Supple, Trachea midline normal ROM, No JVD  Cardiovascular: RRR, S1 normal, S2  normal, no MRG, pulses symmetric and intact bilaterally Pulmonary/Chest: CTAB, no wheezes, rales, or rhonchi Abdominal: Soft. Tender over gallbladder area, no reboud pain. Bowel sounds are normal, no masses, organomegaly, or guarding present.  GU: no CVA tenderness Musculoskeletal: No joint deformities, erythema, or stiffness, ROM full and non-tender Extremities: No leg edema Hematology: no cervical, inginal, or axillary adenopathy.  Neurological: A&O x3, Strength is normal and symmetric bilaterally, cranial nerve II-XII are grossly intact, no focal motor deficit, sensory intact to light touch bilaterally.  Skin: Warm, dry and intact. No rash, cyanosis, or clubbing.  Psychiatric: Normal mood and affect. No suicidal or homicidal ideation.  Assessment & Plan:

## 2013-07-24 NOTE — Assessment & Plan Note (Signed)
Though patient complains of coughing with colored sputum and chronic shortness of breath, patient's lung auscultation is clear bilaterally, he does not seem to have COPD exacerbation.   -will continue albuterol and Advair inhalers -Will add Spiriva inhaler

## 2013-07-24 NOTE — Assessment & Plan Note (Signed)
  Assessment: Progress toward smoking cessation:  smoking less Barriers to progress toward smoking cessation:    Comments:   Plan: Instruction/counseling given:  I counseled patient on the dangers of tobacco use, advised patient to stop smoking, and reviewed strategies to maximize success. Educational resources provided:  [employer], [employer](1-800-QUIT-NOW) brochure Self management tools provided:    Medications to assist with smoking cessation:   Patient agreed to the following self-care plans for smoking cessation:    Other plans: will treat with Chantix

## 2013-07-24 NOTE — Assessment & Plan Note (Addendum)
Lab Results  Component Value Date   HGBA1C >14.0 06/03/2013   HGBA1C 12.5 12/03/2012   HGBA1C 11.4 07/04/2012     Assessment: Diabetes control: poor control (HgbA1C >9%) Progress toward A1C goal:  deteriorated Comments:   Plan: Medications:  will switch to glyburid 2.5 mg daily Home glucose monitoring: Frequency:   Timing:   Instruction/counseling given: reminded to bring blood glucose meter & log to each visit Educational resources provided: brochure Self management tools provided:   Other plans: patient's diabetes is very poorly controlled. The best regimen is to start insulin, but the patient refused today. The patient did not tolerate glipizide. Not very sure whether patient had hypoglycemia when he took glipizide, but possible. Will start low dose of glyburide 2.5 mg daily. Patient was instructed to measure blood sugar level ideally 3 times a day. If it is too difficult, measure blood sugar level once a day, and 3 times a day for at least 3 days before coming back for next visit. Will discuss with him again about starting insulin in next visit when he should have Meter data available.

## 2013-07-30 ENCOUNTER — Other Ambulatory Visit: Payer: Self-pay | Admitting: *Deleted

## 2013-07-30 DIAGNOSIS — E119 Type 2 diabetes mellitus without complications: Secondary | ICD-10-CM

## 2013-07-30 MED ORDER — GLYBURIDE 2.5 MG PO TABS: 2.5000 mg | ORAL_TABLET | Freq: Every day | ORAL | Status: DC

## 2013-07-30 NOTE — Progress Notes (Signed)
INTERNAL MEDICINE TEACHING ATTENDING ADDENDUM - Kel Senn, DO: I reviewed and discussed at the time of visit with the resident Dr. Niu, the patient's medical history, physical examination, diagnosis and results of tests and treatment and I agree with the patient's care as documented. 

## 2013-07-30 NOTE — Telephone Encounter (Signed)
Pharm and ins want a 90 day supply

## 2013-08-01 ENCOUNTER — Telehealth: Payer: Self-pay | Admitting: *Deleted

## 2013-08-01 NOTE — Telephone Encounter (Signed)
Pharmacy called for clarification of what type test stripps were needed. I told them to use accu-chek aviva plus to match meter.  Also on Diabeta I told them the Rx is Diabeta 2.5 mg once daily  Are both of these okay with you.

## 2013-08-02 NOTE — Telephone Encounter (Signed)
It sounds fine to me. Can you please make appointment with me in a few weeks.  Thanks

## 2013-08-05 NOTE — Telephone Encounter (Signed)
Front desk to schedule appointment 

## 2013-09-05 ENCOUNTER — Other Ambulatory Visit: Payer: Self-pay

## 2013-09-07 ENCOUNTER — Encounter: Payer: Self-pay | Admitting: Internal Medicine

## 2013-10-09 ENCOUNTER — Ambulatory Visit (INDEPENDENT_AMBULATORY_CARE_PROVIDER_SITE_OTHER): Payer: PRIVATE HEALTH INSURANCE | Admitting: Internal Medicine

## 2013-10-09 ENCOUNTER — Encounter: Payer: Self-pay | Admitting: Internal Medicine

## 2013-10-09 ENCOUNTER — Ambulatory Visit (INDEPENDENT_AMBULATORY_CARE_PROVIDER_SITE_OTHER): Payer: PRIVATE HEALTH INSURANCE | Admitting: Dietician

## 2013-10-09 VITALS — BP 112/78 | HR 86 | Temp 97.0°F | Ht 76.0 in | Wt 216.4 lb

## 2013-10-09 DIAGNOSIS — E1165 Type 2 diabetes mellitus with hyperglycemia: Secondary | ICD-10-CM

## 2013-10-09 DIAGNOSIS — J449 Chronic obstructive pulmonary disease, unspecified: Secondary | ICD-10-CM

## 2013-10-09 DIAGNOSIS — IMO0002 Reserved for concepts with insufficient information to code with codable children: Secondary | ICD-10-CM

## 2013-10-09 DIAGNOSIS — IMO0001 Reserved for inherently not codable concepts without codable children: Secondary | ICD-10-CM

## 2013-10-09 DIAGNOSIS — E1140 Type 2 diabetes mellitus with diabetic neuropathy, unspecified: Secondary | ICD-10-CM

## 2013-10-09 DIAGNOSIS — I2510 Atherosclerotic heart disease of native coronary artery without angina pectoris: Secondary | ICD-10-CM

## 2013-10-09 DIAGNOSIS — E785 Hyperlipidemia, unspecified: Secondary | ICD-10-CM

## 2013-10-09 DIAGNOSIS — F17200 Nicotine dependence, unspecified, uncomplicated: Secondary | ICD-10-CM

## 2013-10-09 DIAGNOSIS — Z720 Tobacco use: Secondary | ICD-10-CM

## 2013-10-09 DIAGNOSIS — E1142 Type 2 diabetes mellitus with diabetic polyneuropathy: Secondary | ICD-10-CM

## 2013-10-09 DIAGNOSIS — R195 Other fecal abnormalities: Secondary | ICD-10-CM

## 2013-10-09 DIAGNOSIS — E1149 Type 2 diabetes mellitus with other diabetic neurological complication: Secondary | ICD-10-CM

## 2013-10-09 LAB — GLUCOSE, CAPILLARY: Glucose-Capillary: 150 mg/dL — ABNORMAL HIGH (ref 70–99)

## 2013-10-09 LAB — POCT GLYCOSYLATED HEMOGLOBIN (HGB A1C): Hemoglobin A1C: 12.2

## 2013-10-09 MED ORDER — INSULIN GLARGINE 100 UNIT/ML ~~LOC~~ SOLN: 15.0000 [IU] | Freq: Every day | SUBCUTANEOUS | Status: DC

## 2013-10-09 MED ORDER — INSULIN GLARGINE 100 UNIT/ML ~~LOC~~ SOLN: 20.0000 [IU] | Freq: Every day | SUBCUTANEOUS | Status: DC

## 2013-10-09 MED ORDER — ATORVASTATIN CALCIUM 40 MG PO TABS: 40.0000 mg | ORAL_TABLET | Freq: Every day | ORAL | Status: DC

## 2013-10-09 MED ORDER — PREGABALIN 50 MG PO CAPS: 50.0000 mg | ORAL_CAPSULE | Freq: Three times a day (TID) | ORAL | Status: DC

## 2013-10-09 MED ORDER — "INSULIN SYRINGE-NEEDLE U-100 31G X 15/64"" 0.5 ML MISC": 30.0000 [IU] | Freq: Every day | Status: DC

## 2013-10-09 NOTE — Assessment & Plan Note (Signed)
Has significant cough, but otherwise symptoms are mild. Has been off Spiriva, due to side effects of dizziness and lightheadedness. Plan. -Encouraged him to continue with Advair and albuterol. - I also encouraged him to continue with Spiriva, given his advanced COPD - Have spent some time counseling him about smoking cessation. - In the future we'll consider referring him for pulmonary outpatient followup.

## 2013-10-09 NOTE — Assessment & Plan Note (Signed)
Discussed cigarette smoking. Patient has tried NRT (with chewing gums) with some good results but does not like Chantix due to side effects. He reports feeling lightheaded with Chantix. However, he also informs me that nicotine chewing gums have been expensive for him. He hopes will be able to afford them in the future. Plan. - Emphasized the importance of cigarette smoking cessation. -Have encouraged him to continue trying nicotine chewing gums as affordable. - patient does not like nicotine patches due to poor results from the past - Will continue to follow.

## 2013-10-09 NOTE — Assessment & Plan Note (Signed)
Referred to GI for a colonoscopy today.

## 2013-10-09 NOTE — Assessment & Plan Note (Signed)
Reports chest pains which resolve with NGT from time to time. He does not closely follow up with cards. Plan  - will consider referral to cardiology for close follow up given his risk factors.

## 2013-10-09 NOTE — Progress Notes (Signed)
Patient ID: [name] Newton, male   DOB: April 20, 1961, 53 y.o.   MRN: [id number]   Subjective:   HPI: [name] is a 53 y.o. gentleman with past medical history of diabetes, cigarette smoking, history of coronary artery disease, dyslipidemia, who presents for routine followup visit.  Patient has no main complaints today.  DM2, uncontrolled -  Lab Results  Component Value Date   HGBA1C 12.2 10/09/2013   Patient checking blood sugars 1 times/week. Reports fasting blood sugars of 300-400 mg/dL. Currently taking glyburide 2.5 mg daily, and metformin 1000 mg twice a day. Patient misses doses 0 x per week on average.  0 hypoglycemic episodes since last visit. Denies assisted hypoglycemia or recently hospitalizations for either hyper or hypoglycemia. denies polyuria, polydipsia, nausea, vomiting, diarrhea.  does not request refills today.  In regards to diabetic complications:  Microvascular complications: Confirms: peripheral neuropathy ; Denies peripheral neuropathy.    Important diabetic medications: Is patient on aspirin? Yes Is patient on a statin? Yes Is patient on an ACE-I/ ARB? No  Please see the A&P for the status of the pt's chronic medical problems.  ROS: Constitutional: Denies fever, chills, diaphoresis, appetite change and fatigue.  Respiratory: Ongoing cough, without constitutional symptoms. He reports that he stopped taking Spiriva, due to dizziness and lightheadedness.  CVS: No chest pain, palpitations and leg swelling.  GI: No abdominal pain, nausea, vomiting, bloody stools GU: No dysuria, frequency, hematuria, or flank pain.  MSK: No myalgias, back pain, joint swelling, arthralgias  Psych: No depression symptoms. No SI or SA.   Past Medical History  Diagnosis Date  . Diabetes mellitus   . Tobacco abuse   . Hyperlipidemia   . Hypertension   . NSTEMI (non-ST elevated myocardial infarction) April 2012    status post successful PCI and bare-metal   stenting of the first diagonal this admission.   . Guaiac positive stools      Guaiac positive stool with stable H and H.    Current Outpatient Prescriptions  Medication Sig Dispense Refill  . albuterol (PROVENTIL HFA;VENTOLIN HFA) 108 (90 BASE) MCG/ACT inhaler Inhale 2 puffs into the lungs every 6 (six) hours as needed for wheezing.  1 Inhaler  2  . aspirin 81 MG tablet Take 81 mg by mouth daily as needed.       . Blood Glucose Monitoring Suppl (ACCU-CHEK AVIVA PLUS) W/DEVICE KIT 1 Syringe by Does not apply route daily.  1 kit  0  . Blood Glucose Monitoring Suppl DEVI Check blood glucose 3x per day  1 each  0  . esomeprazole (NEXIUM) 20 MG capsule Take 1 capsule (20 mg total) by mouth daily.  30 capsule  1  . Fluticasone-Salmeterol (ADVAIR DISKUS) 100-50 MCG/DOSE AEPB Inhale 1 puff into the lungs every 12 (twelve) hours.  14 each  3  . glucose blood test strip Use to check blood sugar up to 1 times a day before  Meals Dx code- 250.00  100 each  12  . glucose blood test strip Use as instructed  100 each  12  . insulin glargine (LANTUS) 100 UNIT/ML injection Inject 0.15 mLs (15 Units total) into the skin at bedtime.  10 mL  3  . Lancets (ACCU-CHEK MULTICLIX) lancets Use as instructed  100 each  12  . metFORMIN (GLUCOPHAGE) 1000 MG tablet Take 1 tablet (1,000 mg total) by mouth 2 (two) times daily with a meal.  120 tablet  6  . nitroGLYCERIN (NITROSTAT) 0.4 MG SL  tablet Place 1 tablet (0.4 mg total) under the tongue every 5 (five) minutes as needed for chest pain.  30 tablet  1  . atorvastatin (LIPITOR) 40 MG tablet Take 1 tablet (40 mg total) by mouth daily.  30 tablet  11  . Insulin Syringe-Needle U-100 31G X 15/64" 0.5 ML MISC 30 Units by Does not apply route daily.  100 each  3  . pregabalin (LYRICA) 50 MG capsule Take 1 capsule (50 mg total) by mouth 3 (three) times daily.  90 capsule  2  . tiotropium (SPIRIVA HANDIHALER) 18 MCG inhalation capsule Place 1 capsule (18 mcg total) into inhaler  and inhale daily.  30 capsule  3   No current facility-administered medications for this visit.   Family History  Problem Relation Age of Onset  . Heart attack Father   . Coronary artery disease Brother   . Heart attack Brother    History   Social History  . Marital Status: Divorced    Spouse Name: N/A    Number of Children: N/A  . Years of Education: 10   Occupational History  . none     Unemployed since 2012   Social History Main Topics  . Smoking status: Current Every Day Smoker -- 0.50 packs/day    Types: Cigarettes    Last Attempt to Quit: 05/03/2012  . Smokeless tobacco: None     Comment: Patches do not work.  Cutting back.  Wants Chantix  . Alcohol Use: No  . Drug Use: No  . Sexual Activity: None   Other Topics Concern  . None   Social History Narrative   Patient is intermittently homeless without any income at the moment.           Objective:  Physical Exam: Filed Vitals:   10/09/13 1534  BP: 112/78  Pulse: 86  Temp: 97 F (36.1 C)  TempSrc: Oral  Height: 6' 4"  (1.93 m)  Weight: 216 lb 6.4 oz (98.158 kg)  SpO2: 95%   General: Well nourished. No acute distress.  HEENT: Normal oral mucosa. MMM.  Lungs: CTA bilaterally. Heart: RRR; no extra sounds or murmurs  Abdomen: Non-distended, normal BS, soft, nontender; no hepatosplenomegaly  Extremities: No pedal edema. No joint swelling or tenderness. Neurologic: Normal EOM,  Alert and oriented x3. No obvious neurologic/cranial nerve deficits.  Assessment & Plan:  I have discussed my assessment and plan  with  my attending in the clinic, Dr. Marinda Elk  as detailed under problem based charting.

## 2013-10-09 NOTE — Assessment & Plan Note (Signed)
Patient reports that gabapentin "knocks him" out whenever he takes it. He requests for Lyrica Plan  - Discontinue gabapentin due to patient's preference. -Start pregabalin at 50 mg 3 times a day.

## 2013-10-09 NOTE — Patient Instructions (Signed)
General Instructions: Please start taking Lantus 15 units at bedtime  Please stop taking Glyburide  Please continue with Metformin  Please take Lyrica 50 mg three times a day  I have replaced your Losuvastatin with Lipitor for your cholesterol  I would like to see you in 2 weeks  It is important to check you blood sugars at least three times a day and bring the your glucose meter. Please bring all your medications when you come back  Treatment Goals:  Goals (1 Years of Data) as of 10/09/13         As of Today 06/03/13 12/03/12 07/04/12 09/22/10     Result Component    . HEMOGLOBIN A1C < 7.0  12.2 >14.0 12.5 11.4     . LDL CALC < 130      UNABLE TO CALCULA...      Progress Toward Treatment Goals:  Treatment Goal 10/09/2013  Hemoglobin A1C improved  Stop smoking smoking the same amount  Prevent falls -    Self Care Goals & Plans:  Self Care Goal 10/09/2013  Manage my medications take my medicines as prescribed; bring my medications to every visit; refill my medications on time; follow the sick day instructions if I am sick  Monitor my health keep track of my blood glucose; check my feet daily  Eat healthy foods eat more vegetables; eat fruit for snacks and desserts; eat baked foods instead of fried foods; eat foods that are low in salt; eat smaller portions; drink diet soda or water instead of juice or soda  Be physically active find an activity I enjoy  Stop smoking -  Meeting treatment goals -    Home Blood Glucose Monitoring 10/09/2013  Check my blood sugar 3 times a day  When to check my blood sugar before breakfast; before lunch; before dinner     Care Management & Community Referrals:  Referral 10/09/2013  Referrals made for care management support diabetes educator  Referrals made to community resources -     Pregabalin capsules What is this medicine? PREGABALIN (pre GAB a lin) is used to treat nerve pain from diabetes, shingles, spinal cord injury, and fibromyalgia.  It is also used to control seizures in epilepsy. This medicine may be used for other purposes; ask your health care provider or pharmacist if you have questions. COMMON BRAND NAME(S): Lyrica What should I tell my health care provider before I take this medicine? They need to know if you have any of these conditions: -bleeding problems -heart disease, including heart failure -history of alcohol or drug abuse -kidney disease -suicidal thoughts, plans, or attempt; a previous suicide attempt by you or a family member -an unusual or allergic reaction to pregabalin, gabapentin, other medicines, foods, dyes, or preservatives -pregnant or trying to get pregnant or trying to conceive with your partner -breast-feeding How should I use this medicine? Take this medicine by mouth with a glass of water. Follow the directions on the prescription label. You can take this medicine with or without food. Take your doses at regular intervals. Do not take your medicine more often than directed. Do not stop taking except on your doctor's advice. A special MedGuide will be given to you by the pharmacist with each prescription and refill. Be sure to read this information carefully each time. Talk to your pediatrician regarding the use of this medicine in children. Special care may be needed. Overdosage: If you think you have taken too much of this medicine contact a  poison control center or emergency room at once. NOTE: This medicine is only for you. Do not share this medicine with others. What if I miss a dose? If you miss a dose, take it as soon as you can. If it is almost time for your next dose, take only that dose. Do not take double or extra doses. What may interact with this medicine? -alcohol -certain medicines for blood pressure like captopril, enalapril, or lisinopril -certain medicines for diabetes, like pioglitazone or rosiglitazone -certain medicines for anxiety or sleep -narcotic medicines for  pain This list may not describe all possible interactions. Give your health care provider a list of all the medicines, herbs, non-prescription drugs, or dietary supplements you use. Also tell them if you smoke, drink alcohol, or use illegal drugs. Some items may interact with your medicine. What should I watch for while using this medicine? Tell your doctor or healthcare professional if your symptoms do not start to get better or if they get worse. Visit your doctor or health care professional for regular checks on your progress. Do not stop taking except on your doctor's advice. You may develop a severe reaction. Your doctor will tell you how much medicine to take. Wear a medical identification bracelet or chain if you are taking this medicine for seizures, and carry a card that describes your disease and details of your medicine and dosage times. You may get drowsy or dizzy. Do not drive, use machinery, or do anything that needs mental alertness until you know how this medicine affects you. Do not stand or sit up quickly, especially if you are an older patient. This reduces the risk of dizzy or fainting spells. Alcohol may interfere with the effect of this medicine. Avoid alcoholic drinks. If you have a heart condition, like congestive heart failure, and notice that you are retaining water and have swelling in your hands or feet, contact your health care provider immediately. The use of this medicine may increase the chance of suicidal thoughts or actions. Pay special attention to how you are responding while on this medicine. Any worsening of mood, or thoughts of suicide or dying should be reported to your health care professional right away. This medicine has caused reduced sperm counts in some men. This may interfere with the ability to father a child. You should talk to your doctor or health care professional if you are concerned about your fertility. Women who become pregnant while using this  medicine for seizures may enroll in the [place] [employer] Antiepileptic Drug Pregnancy Registry by calling [id number]. This registry collects information about the safety of antiepileptic drug use during pregnancy. What side effects may I notice from receiving this medicine? Side effects that you should report to your doctor or health care professional as soon as possible: -allergic reactions like skin rash, itching or hives, swelling of the face, lips, or tongue -breathing problems -changes in vision -chest pain -confusion -jerking or unusual movements of any part of your body -loss of memory -muscle pain, tenderness, or weakness -suicidal thoughts or other mood changes -swelling of the ankles, feet, hands -unusual bruising or bleeding Side effects that usually do not require medical attention (Report these to your doctor or health care professional if they continue or are bothersome.): -dizziness -drowsiness -dry mouth -headache -nausea -tremors -trouble sleeping -weight gain This list may not describe all possible side effects. Call your doctor for medical advice about side effects. You may report side effects to FDA at 1-800-FDA-1088. Where should  I keep my medicine? Keep out of the reach of children. This medicine can be abused. Keep your medicine in a safe place to protect it from theft. Do not share this medicine with anyone. Selling or giving away this medicine is dangerous and against the law. Store at room temperature between 15 and 30 degrees C (59 and 86 degrees F). Throw away any unused medicine after the expiration date. NOTE: This sheet is a summary. It may not cover all possible information. If you have questions about this medicine, talk to your doctor, pharmacist, or health care provider.  2014, Elsevier/Gold Standard. (2010-11-18 20:00:36)         Insulin Glargine injection What is this medicine? INSULIN GLARGINE (IN su lin GLAR geen) is a human-made form of  insulin. This drug lowers the amount of sugar in your blood. It is a long-acting insulin that is usually given once a day. This medicine may be used for other purposes; ask your health care provider or pharmacist if you have questions. COMMON BRAND NAME(S): Lantus SoloStar, Lantus What should I tell my health care provider before I take this medicine? They need to know if you have any of these conditions: -episodes of hypoglycemia -kidney disease -liver disease -an unusual or allergic reaction to insulin, metacresol, other medicines, foods, dyes, or preservatives -pregnant or trying to get pregnant -breast-feeding How should I use this medicine? This medicine is for injection under the skin. Use this medicine at the same time each day. Use exactly as directed. This insulin should never be mixed in the same syringe with other insulins before injection. Do not vigorously shake before use. You will be taught how to use this medicine and how to adjust doses for activities and illness. Do not use more insulin than prescribed. Always check the appearance of your insulin before using it. This medicine should be clear and colorless like water. Do not use it if it is cloudy, thickened, colored, or has solid particles in it. It is important that you put your used needles and syringes in a special sharps container. Do not put them in a trash can. If you do not have a sharps container, call your pharmacist or healthcare provider to get one. Talk to your pediatrician regarding the use of this medicine in children. Special care may be needed. Overdosage: If you think you have taken too much of this medicine contact a poison control center or emergency room at once. NOTE: This medicine is only for you. Do not share this medicine with others. What if I miss a dose? It is important not to miss a dose. Your health care professional or doctor should discuss a plan for missed doses with you. If you do miss a dose,  follow their plan. Do not take double doses. What may interact with this medicine? -other medicines for diabetes Many medications may cause an increase or decrease in blood sugar, these include: -alcohol containing beverages -aspirin and aspirin-like drugs -chloramphenicol -chromium -diuretics -male hormones, like estrogens or progestins and birth control pills -heart medicines -isoniazid -male hormones or anabolic steroids -medicines for weight loss -medicines for allergies, asthma, cold, or cough -medicines for mental problems -medicines called MAO Inhibitors like Nardil, Parnate, Marplan, Eldepryl -niacin -NSAIDs, medicines for pain and inflammation, like ibuprofen or naproxen -pentamidine -phenytoin -probenecid -quinolone antibiotics like ciprofloxacin, levofloxacin, ofloxacin -some herbal dietary supplements -steroid medicines like prednisone or cortisone -thyroid medicine Some medications can hide the warning symptoms of low blood sugar. You may  need to monitor your blood sugar more closely if you are taking one of these medications. These include: -beta-blockers such as atenolol, metoprolol, propranolol -clonidine -guanethidine -reserpine This list may not describe all possible interactions. Give your health care provider a list of all the medicines, herbs, non-prescription drugs, or dietary supplements you use. Also tell them if you smoke, drink alcohol, or use illegal drugs. Some items may interact with your medicine. What should I watch for while using this medicine? Visit your health care professional or doctor for regular checks on your progress. A test called the HbA1C (A1C) will be monitored. This is a simple blood test. It measures your blood sugar control over the last 2 to 3 months. You will receive this test every 3 to 6 months. Learn how to check your blood sugar. Learn the symptoms of low and high blood sugar and how to manage them. Always carry a  quick-source of sugar with you in case you have symptoms of low blood sugar. Examples include hard sugar candy or glucose tablets. Make sure others know that you can choke if you eat or drink when you develop serious symptoms of low blood sugar, such as seizures or unconsciousness. They must get medical help at once. Tell your doctor or health care professional if you have high blood sugar. You might need to change the dose of your medicine. If you are sick or exercising more than usual, you might need to change the dose of your medicine. Do not skip meals. Ask your doctor or health care professional if you should avoid alcohol. Many nonprescription cough and cold products contain sugar or alcohol. These can affect blood sugar. Make sure that you have the right kind of syringe for the type of insulin you use. Try not to change the brand and type of insulin or syringe unless your health care professional or doctor tells you to. Switching insulin brand or type can cause dangerously high or low blood sugar. Always keep an extra supply of insulin, syringes, and needles on hand. Use a syringe one time only. Throw away syringe and needle in a closed container to prevent accidental needle sticks. Insulin pens and cartridges should never be shared. Sharing may result in passing of viruses like hepatitis or HIV. Wear a medical ID bracelet or chain, and carry a card that describes your disease and details of your medicine and dosage times. What side effects may I notice from receiving this medicine? Side effects that you should report to your health care professional or doctor as soon as possible: -allergic reactions like skin rash, itching or hives, swelling of the face, lips, or tongue -breathing problems -signs and symptoms of high blood sugar such as dizziness, dry mouth, dry skin, fruity breath, nausea, stomach pain, increased hunger or thirst, increased urination -signs and symptoms of low blood sugar such as  feeling anxious, confusion, dizziness, increased hunger, unusually weak or tired, sweating, shakiness, cold, irritable, headache, blurred vision, fast heartbeat, loss of consciousness Side effects that usually do not require medical attention (report to your health care professional or doctor if they continue or are bothersome): -increase or decrease in fatty tissue under the skin due to overuse of a particular injection site -itching, burning, swelling, or rash at site where injected This list may not describe all possible side effects. Call your doctor for medical advice about side effects. You may report side effects to FDA at 1-800-FDA-1088. Where should I keep my medicine? Keep out of the  reach of children. Store unopened vials in a refrigerator between 2 and 8 degrees C (36 and 46 degrees F). Do not freeze or use if the insulin has been frozen. Opened vials (vials currently in use) may be stored in the refrigerator or at room temperature, at approximately 25 degrees C (77 degrees F) or cooler. Keeping your insulin at room temperature decreases the amount of pain during injection. Once opened, your insulin can be used for 28 days. After 28 days, the vial should be thrown away. Store unopened pen-injector cartridges in a refrigerator between 2 and 8 degrees C (36 and 46 degrees F.) Do not freeze or use if the insulin has been frozen. Insulin cartridges inserted into the [employer] system should be kept at room temperature, approximately 25 degrees C (77 degrees F) or cooler. Do not store in the refrigerator. Once inserted into the [employer] system, the insulin can be used for 28 days. After 28 days, the cartridge should be thrown away. Protect from light and excessive heat. Throw away any unused medicine after the expiration date or after the specified time for room temperature storage has passed. NOTE: This sheet is a summary. It may not cover all possible information. If you have questions about this  medicine, talk to your doctor, pharmacist, or health care provider.  2014, [employer]/Gold Standard. (2012-08-29 13:05:20)

## 2013-10-09 NOTE — Assessment & Plan Note (Signed)
Lab Results  Component Value Date   HGBA1C 12.2 10/09/2013   HGBA1C >14.0 06/03/2013   HGBA1C 12.5 12/03/2012     Assessment: Diabetes control: poor control (HgbA1C >9%) Progress toward A1C goal:  improved Comments: compliant with Oral hypoglycemics medications  Plan: Medications:  Discontinue glyburide. Continue with metformin 1000 mg twice daily. Start Lantus 15 units daily. Home glucose monitoring: Frequency: 3 times a day Timing: before breakfast;before lunch;before dinner Instruction/counseling given: reminded to bring blood glucose meter & log to each visit, reminded to bring medications to each visit and discussed foot care Educational resources provided: brochure;handout Self management tools provided:   Other plans:  1. Refer to [facility] for diabetic counseling 2. Counseled about cigarette smoking cessation. 3. Lipid panel and CMP. 4. Completed foot exam. 5. Change lovastatin to Lipitor 40 mg daily. 6. Followup in 2 weeks. 7. Encouraged him to bring his glucose meter.

## 2013-10-10 ENCOUNTER — Encounter: Payer: Self-pay | Admitting: Dietician

## 2013-10-10 LAB — COMPLETE METABOLIC PANEL WITH GFR
ALT: 31 U/L (ref 0–53)
AST: 22 U/L (ref 0–37)
Albumin: 4.6 g/dL (ref 3.5–5.2)
Alkaline Phosphatase: 49 U/L (ref 39–117)
BUN: 20 mg/dL (ref 6–23)
CO2: 28 mEq/L (ref 19–32)
Calcium: 10.1 mg/dL (ref 8.4–10.5)
Chloride: 98 mEq/L (ref 96–112)
Creat: 1.05 mg/dL (ref 0.50–1.35)
GFR, Est African American: >89 mL/min
GFR, Est Non African American: 81 mL/min
Glucose, Bld: 154 mg/dL — ABNORMAL HIGH (ref 70–99)
Potassium: 5 mEq/L (ref 3.5–5.3)
Sodium: 135 mEq/L (ref 135–145)
Total Bilirubin: 0.4 mg/dL (ref 0.2–1.2)
Total Protein: 8.1 g/dL (ref 6.0–8.3)

## 2013-10-10 LAB — LIPID PANEL
Cholesterol: 357 mg/dL — ABNORMAL HIGH (ref 0–200)
HDL: 33 mg/dL — ABNORMAL LOW (ref >39)
Total CHOL/HDL Ratio: 10.8 Ratio
Triglycerides: 961 mg/dL — ABNORMAL HIGH (ref <150)

## 2013-10-10 LAB — LDL CHOLESTEROL, DIRECT: Direct LDL: 132 mg/dL — ABNORMAL HIGH

## 2013-10-10 LAB — MICROALBUMIN / CREATININE URINE RATIO
Creatinine, Urine: 181.9 mg/dL
Microalb Creat Ratio: 6.3 mg/g (ref 0.0–30.0)
Microalb, Ur: 1.15 mg/dL (ref 0.00–1.89)

## 2013-10-10 NOTE — Addendum Note (Signed)
Addended by: Dow AdolphKAZIBWE, Tristian Bouska on: 10/10/2013 11:49 AM   Modules accepted: Orders

## 2013-10-10 NOTE — Progress Notes (Signed)
Diabetes Self-Management Education  Visit Number: Follow-up 1  10/10/2013 [name], identified by name and date of birth, is a 53 y.o. male with Type 2 diabetes  .     ASSESSMENT  Patient Concerns:     Weight and vitals are in the office note  Lab Results: LDL Cholesterol  Date Value Ref Range Status  10/09/2013 NOT CALC  0 - 99 mg/dL Final           Hemoglobin A1C  Date Value Ref Range Status  10/09/2013 12.2   Final  09/22/2010 8.5 (NOTE)                                                                       According to the ADA Clinical Practice Recommendations for 2011, when HbA1c is used as a screening test:   >=6.5%   Diagnostic of Diabetes Mellitus           (if abnormal result  is confirmed)  5.7-6.4%   Increased risk of developing Diabetes Mellitus  References:Diagnosis and Classification of Diabetes Mellitus,Diabetes Care,2011,34(Suppl 1):S62-S69 and Standards of Medical Care in         Diabetes - 2011,Diabetes Care,2011,34  (Suppl 1):S11-S61.* <5.7 % Final     Family History  Problem Relation Age of Onset  . Heart attack Father   . Coronary artery disease Brother   . Heart attack Brother    History  Substance Use Topics  . Smoking status: Current Every Day Smoker -- 0.50 packs/day    Types: Cigarettes    Last Attempt to Quit: 05/03/2012  . Smokeless tobacco: Not on file     Comment: Patches do not work.  Cutting back.  Wants Chantix  . Alcohol Use: No    Assessment comments: asked to instruct patient on self injection of insulin. Patient repeated demonstration saying" it wasn't as bad as i thought it would be" it didn't;t hurt at all.    Diet Recall: not done today   Individualized Plan for Diabetes Self-Management Training  Patient individualized diabetes plan discussed today with patient and includes:  physical activity, how to handle highs and lows, Special care for my body when I have diabetes, Dealing daily with diabetes  Education  Topics Reviewed with Patient Today:  Topic Points Discussed  Disease State    Nutrition Management    Physical Activity and Exercise    Medications Taught/evaluated insulin injection, site rotation, insulin storage and needle disposal.  Monitoring    Acute Complications    Chronic Complications    Psychosocial Adjustment    Goal Setting    Preconception Care (if applicable)      PATIENTS GOALS   Learning Objective(s):     Goal The patient agrees to:  Nutrition    Physical Activity    Medications take my medication as prescribed  Monitoring    Problem Solving    Reducing Risk  still working on decreasing smoking  Health Coping     Patient Self-Evaluation of Goals (Subsequent Visits)  Goal The patient rates self as meeting goals (% of time)  Nutrition    Physical Activity    Medications >75%  Monitoring    Problem Solving    Reducing Risk  Health Coping       PERSONALIZED PLAN / SUPPORT  Self-Management Support:  Doctor's office;CDE visits ______________________________________________________________________  Outcomes  Expected Outcomes:  Demonstrated interest in learning. Expect positive outcomes Self-Care Barriers:  Lack of material resources Education material provided: yes- large picture handouts on steps to injecting insulin If problems or questions, patient to contact team via:  Phone Time in: 1630     Time out: 1700 Future DSME appointment: - 4-6 wks   Cecil CrankerDonna M Octavius Shin 10/10/2013 11:34 AM

## 2013-10-11 ENCOUNTER — Telehealth: Payer: Self-pay | Admitting: *Deleted

## 2013-10-11 NOTE — Telephone Encounter (Signed)
Chart opened in error. Stanton KidneyDebra Dezarae Mcclaran RN 10/11/13 9:20AM

## 2013-10-11 NOTE — Telephone Encounter (Deleted)
[name] called about pt does not have $4.00 for

## 2013-11-06 ENCOUNTER — Other Ambulatory Visit: Payer: Self-pay | Admitting: *Deleted

## 2013-11-06 DIAGNOSIS — K219 Gastro-esophageal reflux disease without esophagitis: Secondary | ICD-10-CM

## 2013-11-06 DIAGNOSIS — J449 Chronic obstructive pulmonary disease, unspecified: Secondary | ICD-10-CM

## 2013-11-06 MED ORDER — ESOMEPRAZOLE MAGNESIUM 20 MG PO CPDR: 20.0000 mg | DELAYED_RELEASE_CAPSULE | Freq: Every day | ORAL | Status: DC

## 2013-11-06 MED ORDER — FLUTICASONE-SALMETEROL 100-50 MCG/DOSE IN AEPB: 1.0000 | INHALATION_SPRAY | Freq: Two times a day (BID) | RESPIRATORY_TRACT | Status: DC

## 2013-11-06 MED ORDER — ALBUTEROL SULFATE HFA 108 (90 BASE) MCG/ACT IN AERS: 2.0000 | INHALATION_SPRAY | Freq: Four times a day (QID) | RESPIRATORY_TRACT | Status: DC | PRN

## 2013-11-20 ENCOUNTER — Ambulatory Visit (INDEPENDENT_AMBULATORY_CARE_PROVIDER_SITE_OTHER): Payer: PRIVATE HEALTH INSURANCE | Admitting: Internal Medicine

## 2013-11-20 ENCOUNTER — Encounter: Payer: Self-pay | Admitting: Internal Medicine

## 2013-11-20 VITALS — BP 102/70 | HR 89 | Temp 97.8°F | Ht 72.0 in | Wt 214.7 lb

## 2013-11-20 DIAGNOSIS — E1349 Other specified diabetes mellitus with other diabetic neurological complication: Secondary | ICD-10-CM

## 2013-11-20 DIAGNOSIS — E1165 Type 2 diabetes mellitus with hyperglycemia: Secondary | ICD-10-CM

## 2013-11-20 DIAGNOSIS — IMO0002 Reserved for concepts with insufficient information to code with codable children: Secondary | ICD-10-CM

## 2013-11-20 DIAGNOSIS — R195 Other fecal abnormalities: Secondary | ICD-10-CM

## 2013-11-20 DIAGNOSIS — E785 Hyperlipidemia, unspecified: Secondary | ICD-10-CM

## 2013-11-20 DIAGNOSIS — E0842 Diabetes mellitus due to underlying condition with diabetic polyneuropathy: Secondary | ICD-10-CM

## 2013-11-20 DIAGNOSIS — E1142 Type 2 diabetes mellitus with diabetic polyneuropathy: Secondary | ICD-10-CM

## 2013-11-20 DIAGNOSIS — R1031 Right lower quadrant pain: Secondary | ICD-10-CM

## 2013-11-20 DIAGNOSIS — J449 Chronic obstructive pulmonary disease, unspecified: Secondary | ICD-10-CM

## 2013-11-20 DIAGNOSIS — E1149 Type 2 diabetes mellitus with other diabetic neurological complication: Secondary | ICD-10-CM

## 2013-11-20 LAB — GLUCOSE, CAPILLARY: Glucose-Capillary: 286 mg/dL — ABNORMAL HIGH (ref 70–99)

## 2013-11-20 MED ORDER — LOVASTATIN 40 MG PO TABS: 40.0000 mg | ORAL_TABLET | Freq: Every day | ORAL | Status: DC

## 2013-11-20 MED ORDER — PREGABALIN 50 MG PO CAPS: 50.0000 mg | ORAL_CAPSULE | Freq: Three times a day (TID) | ORAL | Status: DC

## 2013-11-20 MED ORDER — FLUTICASONE-SALMETEROL 100-50 MCG/DOSE IN AEPB: 1.0000 | INHALATION_SPRAY | Freq: Two times a day (BID) | RESPIRATORY_TRACT | Status: DC

## 2013-11-20 MED ORDER — METFORMIN HCL 1000 MG PO TABS: 1000.0000 mg | ORAL_TABLET | Freq: Two times a day (BID) | ORAL | Status: DC

## 2013-11-20 MED ORDER — ALBUTEROL SULFATE HFA 108 (90 BASE) MCG/ACT IN AERS: 2.0000 | INHALATION_SPRAY | Freq: Four times a day (QID) | RESPIRATORY_TRACT | Status: DC | PRN

## 2013-11-20 NOTE — Assessment & Plan Note (Signed)
GI appt set up for 11/27/2013.
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
## 2013-11-20 NOTE — Assessment & Plan Note (Signed)
Lab Results  Component Value Date   HGBA1C 12.2 10/09/2013   HGBA1C >14.0 06/03/2013   HGBA1C 12.5 12/03/2012     Assessment: Diabetes control: poor control (HgbA1C >9%) Progress toward A1C goal:  unchanged Comments: patient claims that he lost his meter recently. He has only taken lantus once since his last OV due to fear of hypoglycemia without ability to monitor his sugar level.  Plan: Medications:  will order another glucometer today. I will cont with Lantus at 15 units and Metformin 1g bid. encouraged patient to call clinic every time he has challenges with his medications or in case of questions Home glucose monitoring: Frequency: 3 times a day Timing: before breakfast;before lunch;before dinner Instruction/counseling given: no instruction/counseling  Educational resources provided:   Self management tools provided:   Other plans:  CBG 288 today.  Will f/u in 1 month Counseled about hypoglycemia.

## 2013-11-20 NOTE — Assessment & Plan Note (Signed)
Assessment: Most likely diagnosis: Musculoskeletal abdominal pain in view of described symptoms and clinical examination. Ddx: No evidence of herpes zoster. I was concerned about nephrolithiasis however, symptoms are not really consistent with this. No evidence of hernia of exam. Prior CTA of chest in 2012 incidentally revealed fatty liver infiltration but he has no organomegaly on palpation of abd. Another possibility is inflammatory bowel disease. Fortunately, a colonoscopy is scheduled for 11/27/2013.   Plan: 1. Labs/imaging: Deferred at this time. Discussed with the patient cost-benefit of imaging of his abdomen with CT or U/S. Currently, patient does not have insurance coverage for imaging studies. He agreed to wait and reevaluate clinically with  trial of Tylenol as needed for pain. 2. Therapy: Recommended Tylenol as needed 3. Follow up: Keep appointment with GI to rule out colonic etiology like IBD for his symptoms.

## 2013-11-20 NOTE — Assessment & Plan Note (Signed)
Symptoms well controlled. Will refill his inhalers. Patient does not take Spiriva. He does not like it.

## 2013-11-20 NOTE — Patient Instructions (Addendum)
General Instructions: We will see if we can get you a meter Please take lantus as prescribed I have sent your medications to the pharmacy Please take Tylenol for your abdominal pain  Please come back in 1 month    Treatment Goals:  Goals (1 Years of Data) as of 11/20/13         10/09/13 06/03/13 12/03/12 07/04/12 09/22/10     Result Component    . HEMOGLOBIN A1C < 7.0  12.2 >14.0 12.5 11.4     . LDL CALC < 130  NOT CALC    UNABLE TO CALCULA...      Progress Toward Treatment Goals:  Treatment Goal 11/20/2013  Hemoglobin A1C unchanged  Stop smoking smoking the same amount  Prevent falls -    Self Care Goals & Plans:  Self Care Goal 11/20/2013  Manage my medications take my medicines as prescribed; bring my medications to every visit; refill my medications on time  Monitor my health -  Eat healthy foods -  Be physically active -  Stop smoking -  Meeting treatment goals -    Home Blood Glucose Monitoring 11/20/2013  Check my blood sugar 3 times a day  When to check my blood sugar before breakfast; before lunch; before dinner     Care Management & Community Referrals:  Referral 11/20/2013  Referrals made for care management support health educator  Referrals made to community resources -

## 2013-11-21 NOTE — Progress Notes (Signed)
Attending physician note: Presenting problems, physical findings, medications, medical history, reviewed with resident physician Dr. Dow Adolphichard Kazibwe and I concur with his management. The patient has chronic right flank pain. Likely musculoskeletal in symptomatic treatment appropriate at this time. [name] Newton, M.D., FACP

## 2013-12-31 ENCOUNTER — Other Ambulatory Visit: Payer: Self-pay | Admitting: Internal Medicine

## 2014-01-01 ENCOUNTER — Other Ambulatory Visit: Payer: Self-pay | Admitting: Internal Medicine

## 2014-01-02 ENCOUNTER — Other Ambulatory Visit: Payer: Self-pay | Admitting: *Deleted

## 2014-01-02 DIAGNOSIS — E0842 Diabetes mellitus due to underlying condition with diabetic polyneuropathy: Secondary | ICD-10-CM

## 2014-01-02 MED ORDER — PREGABALIN 50 MG PO CAPS: 50.0000 mg | ORAL_CAPSULE | Freq: Three times a day (TID) | ORAL | Status: DC

## 2014-01-02 NOTE — Telephone Encounter (Signed)
Rx faxed in.

## 2014-01-22 ENCOUNTER — Encounter: Payer: PRIVATE HEALTH INSURANCE | Admitting: Internal Medicine

## 2014-03-05 ENCOUNTER — Ambulatory Visit (INDEPENDENT_AMBULATORY_CARE_PROVIDER_SITE_OTHER): Payer: PRIVATE HEALTH INSURANCE | Admitting: Internal Medicine

## 2014-03-05 ENCOUNTER — Encounter: Payer: Self-pay | Admitting: Internal Medicine

## 2014-03-05 VITALS — BP 107/73 | HR 82 | Temp 98.0°F | Ht 72.0 in | Wt 218.7 lb

## 2014-03-05 DIAGNOSIS — E1165 Type 2 diabetes mellitus with hyperglycemia: Secondary | ICD-10-CM

## 2014-03-05 DIAGNOSIS — Z720 Tobacco use: Secondary | ICD-10-CM

## 2014-03-05 DIAGNOSIS — S81802A Unspecified open wound, left lower leg, initial encounter: Secondary | ICD-10-CM

## 2014-03-05 DIAGNOSIS — IMO0002 Reserved for concepts with insufficient information to code with codable children: Secondary | ICD-10-CM

## 2014-03-05 DIAGNOSIS — E785 Hyperlipidemia, unspecified: Secondary | ICD-10-CM

## 2014-03-05 DIAGNOSIS — E1140 Type 2 diabetes mellitus with diabetic neuropathy, unspecified: Secondary | ICD-10-CM

## 2014-03-05 DIAGNOSIS — R1031 Right lower quadrant pain: Secondary | ICD-10-CM

## 2014-03-05 DIAGNOSIS — R195 Other fecal abnormalities: Secondary | ICD-10-CM

## 2014-03-05 DIAGNOSIS — I2510 Atherosclerotic heart disease of native coronary artery without angina pectoris: Secondary | ICD-10-CM

## 2014-03-05 DIAGNOSIS — E0842 Diabetes mellitus due to underlying condition with diabetic polyneuropathy: Secondary | ICD-10-CM

## 2014-03-05 LAB — HM DIABETES EYE EXAM

## 2014-03-05 LAB — GLUCOSE, CAPILLARY: Glucose-Capillary: 214 mg/dL — ABNORMAL HIGH (ref 70–99)

## 2014-03-05 LAB — POCT GLYCOSYLATED HEMOGLOBIN (HGB A1C): Hemoglobin A1C: 9.4

## 2014-03-05 MED ORDER — INSULIN GLARGINE 100 UNIT/ML ~~LOC~~ SOLN: 20.0000 [IU] | Freq: Every day | SUBCUTANEOUS | Status: DC

## 2014-03-05 MED ORDER — PREGABALIN 50 MG PO CAPS: 50.0000 mg | ORAL_CAPSULE | Freq: Three times a day (TID) | ORAL | Status: DC

## 2014-03-05 MED ORDER — ACCU-CHEK AVIVA PLUS W/DEVICE KIT: 1.0000 | PACK | Freq: Every day | Status: DC

## 2014-03-05 MED ORDER — METFORMIN HCL 1000 MG PO TABS: 1000.0000 mg | ORAL_TABLET | Freq: Two times a day (BID) | ORAL | Status: DC

## 2014-03-05 NOTE — Assessment & Plan Note (Signed)
Lab Results  Component Value Date   HGBA1C 9.4 03/05/2014   HGBA1C 12.2 10/09/2013   HGBA1C >14.0 06/03/2013     Assessment: Diabetes control: poor control (HgbA1C >9%) Progress toward A1C goal:  improved Comments: Does not take Lantus due to lack of a glucose meter and the fear of hypoglycemia with insulin.  Plan: Medications:  Encouraged the patient will obtain his glucose meter and start taking Lantus 15 units at bedtime. Continue with metformin 1000 mg daily. Home glucose monitoring: Frequency: 3 times a day Timing: before breakfast;before lunch;before dinner Instruction/counseling given: reminded to bring blood glucose meter & log to each visit, discussed the need for weight loss and discussed diet Educational resources provided:   Self management tools provided:   Other plans: Get him a prescription of glucose meter. Retinal scan in ordered. Followup in one month

## 2014-03-05 NOTE — Assessment & Plan Note (Addendum)
Stable without symptoms. Refilled his lovastatin. Encouraged him to stop smoking

## 2014-03-05 NOTE — Assessment & Plan Note (Signed)
The wound does not look infected. Appears to be healing. I encouraged him to keep it clean. No signs for systemic infection at this time, therefore, antibiotic therapy is indicated at this time. We'll continue to follow as needed.

## 2014-03-05 NOTE — Assessment & Plan Note (Signed)
Patient is unwilling to have a colonoscopy due to various reasons. Will order home FOBT's. He is waiting to use them.

## 2014-03-05 NOTE — Assessment & Plan Note (Signed)
Refilled lovastatin 

## 2014-03-05 NOTE — Patient Instructions (Signed)
General Instructions: Please take your medications as instructed I will give you stool cards today I will order an ultrasound scan of your stomach. I will call you with results  Please come back in 1 month  Please bring your medicines with you each time you come to clinic.  Medicines may include prescription medications, over-the-counter medications, herbal remedies, eye drops, vitamins, or other pills.   Progress Toward Treatment Goals:  Treatment Goal 03/05/2014  Hemoglobin A1C improved  Stop smoking smoking the same amount  Prevent falls unchanged    Self Care Goals & Plans:  Self Care Goal 03/05/2014  Manage my medications take my medicines as prescribed; bring my medications to every visit; refill my medications on time  Monitor my health -  Eat healthy foods -  Be physically active -  Stop smoking -  Meeting treatment goals -    Home Blood Glucose Monitoring 03/05/2014  Check my blood sugar 3 times a day  When to check my blood sugar before breakfast; before lunch; before dinner     Care Management & Community Referrals:  Referral 03/05/2014  Referrals made for care management support none needed  Referrals made to community resources -

## 2014-03-05 NOTE — Assessment & Plan Note (Signed)
Assessment: Most likely diagnosis: Musculoskeletal abdominal pain versus intra-abdominal pathology or a gallbladder or biliary tract disorder. Ddx: No evidence of herpes zoster. No evidence of hernia of exam. Prior CTA of chest in 2012 incidentally revealed fatty liver infiltration but he has no organomegaly on palpation of abd. Another possibility is inflammatory bowel disease. Declined a colonoscopy, which was stated for 11/27/2013.   Plan: 1. Labs/imaging: Emphasize to the patient that it is imperative for him to obtain ultrasound scan of his abdomen. Will obtain preauthorization for this can today 2. Therapy: Recommended Tylenol as needed 3. Follow up: Will followup after results of the ultrasound scan.
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
## 2014-03-05 NOTE — Assessment & Plan Note (Signed)
Symptoms stable. Refilled Lyrica 50 mg 3 times a day

## 2014-03-06 ENCOUNTER — Encounter: Payer: Self-pay | Admitting: *Deleted

## 2014-03-07 ENCOUNTER — Encounter: Payer: Self-pay | Admitting: *Deleted

## 2014-03-07 NOTE — Addendum Note (Signed)
Addended by: Neomia DearPOWERS, Tobby Fawcett E on: 03/07/2014 08:45 AM   Modules accepted: Orders

## 2014-03-10 NOTE — Progress Notes (Signed)
Case discussed with Dr. Kazibwe soon after the resident saw the patient.  We reviewed the resident's history and exam and pertinent patient test results.  I agree with the assessment, diagnosis and plan of care documented in the resident's note. 

## 2014-03-14 ENCOUNTER — Ambulatory Visit (HOSPITAL_COMMUNITY)
Admission: RE | Admit: 2014-03-14 | Discharge: 2014-03-14 | Disposition: A | Discharge status: Home / Self Care | Payer: PRIVATE HEALTH INSURANCE | Source: Ambulatory Visit | Attending: Internal Medicine | Admitting: Internal Medicine

## 2014-03-14 ENCOUNTER — Other Ambulatory Visit: Payer: Self-pay

## 2014-03-14 DIAGNOSIS — R1031 Right lower quadrant pain: Secondary | ICD-10-CM | POA: Diagnosis not present

## 2014-03-14 DIAGNOSIS — R1011 Right upper quadrant pain: Secondary | ICD-10-CM | POA: Insufficient documentation

## 2014-03-19 ENCOUNTER — Other Ambulatory Visit (INDEPENDENT_AMBULATORY_CARE_PROVIDER_SITE_OTHER): Payer: PRIVATE HEALTH INSURANCE

## 2014-03-19 DIAGNOSIS — R195 Other fecal abnormalities: Secondary | ICD-10-CM

## 2014-03-19 LAB — POC HEMOCCULT BLD/STL (HOME/3-CARD/SCREEN)
Card #2 Fecal Occult Blod, POC: NEGATIVE
Card #3 Fecal Occult Blood, POC: NEGATIVE
Fecal Occult Blood, POC: NEGATIVE

## 2014-03-20 ENCOUNTER — Encounter: Payer: Self-pay | Admitting: Internal Medicine

## 2014-07-15 ENCOUNTER — Ambulatory Visit (INDEPENDENT_AMBULATORY_CARE_PROVIDER_SITE_OTHER): Payer: Medicare Other | Admitting: Internal Medicine

## 2014-07-15 VITALS — BP 118/75 | HR 81 | Temp 97.7°F | Ht 72.0 in | Wt 215.5 lb

## 2014-07-15 DIAGNOSIS — F17210 Nicotine dependence, cigarettes, uncomplicated: Secondary | ICD-10-CM

## 2014-07-15 DIAGNOSIS — E1165 Type 2 diabetes mellitus with hyperglycemia: Secondary | ICD-10-CM

## 2014-07-15 DIAGNOSIS — IMO0002 Reserved for concepts with insufficient information to code with codable children: Secondary | ICD-10-CM

## 2014-07-15 DIAGNOSIS — Z0000 Encounter for general adult medical examination without abnormal findings: Secondary | ICD-10-CM

## 2014-07-15 DIAGNOSIS — E1140 Type 2 diabetes mellitus with diabetic neuropathy, unspecified: Secondary | ICD-10-CM

## 2014-07-15 DIAGNOSIS — J449 Chronic obstructive pulmonary disease, unspecified: Secondary | ICD-10-CM | POA: Diagnosis not present

## 2014-07-15 DIAGNOSIS — E785 Hyperlipidemia, unspecified: Secondary | ICD-10-CM

## 2014-07-15 DIAGNOSIS — K219 Gastro-esophageal reflux disease without esophagitis: Secondary | ICD-10-CM

## 2014-07-15 DIAGNOSIS — Z7951 Long term (current) use of inhaled steroids: Secondary | ICD-10-CM

## 2014-07-15 DIAGNOSIS — Z720 Tobacco use: Secondary | ICD-10-CM

## 2014-07-15 HISTORY — DX: Encounter for general adult medical examination without abnormal findings: Z00.00

## 2014-07-15 LAB — GLUCOSE, CAPILLARY: Glucose-Capillary: 172 mg/dL — ABNORMAL HIGH (ref 70–99)

## 2014-07-15 LAB — POCT GLYCOSYLATED HEMOGLOBIN (HGB A1C): Hemoglobin A1C: 7.7

## 2014-07-15 MED ORDER — ALBUTEROL SULFATE HFA 108 (90 BASE) MCG/ACT IN AERS: 2.0000 | INHALATION_SPRAY | Freq: Four times a day (QID) | RESPIRATORY_TRACT | Status: DC | PRN

## 2014-07-15 MED ORDER — ESOMEPRAZOLE MAGNESIUM 20 MG PO CPDR: 20.0000 mg | DELAYED_RELEASE_CAPSULE | Freq: Every day | ORAL | Status: DC

## 2014-07-15 MED ORDER — PREGABALIN 50 MG PO CAPS: 50.0000 mg | ORAL_CAPSULE | Freq: Three times a day (TID) | ORAL | Status: DC

## 2014-07-15 MED ORDER — LOVASTATIN 40 MG PO TABS: 40.0000 mg | ORAL_TABLET | Freq: Every day | ORAL | Status: DC

## 2014-07-15 MED ORDER — ASPIRIN 81 MG PO TABS: 81.0000 mg | ORAL_TABLET | Freq: Every day | ORAL | Status: DC | PRN

## 2014-07-15 MED ORDER — GLUCOSE BLOOD VI STRP: ORAL_STRIP | Status: DC

## 2014-07-15 MED ORDER — METFORMIN HCL 1000 MG PO TABS: 1000.0000 mg | ORAL_TABLET | Freq: Two times a day (BID) | ORAL | Status: DC

## 2014-07-15 MED ORDER — TIOTROPIUM BROMIDE MONOHYDRATE 18 MCG IN CAPS: 18.0000 ug | ORAL_CAPSULE | Freq: Every day | RESPIRATORY_TRACT | Status: DC

## 2014-07-15 MED ORDER — FLUTICASONE-SALMETEROL 100-50 MCG/DOSE IN AEPB: 1.0000 | INHALATION_SPRAY | Freq: Two times a day (BID) | RESPIRATORY_TRACT | Status: DC

## 2014-07-15 NOTE — Assessment & Plan Note (Signed)
Patient states that he continues to smoke 7-10 cigarettes a day. However, he is ready to try to quit smoking again. He states that he has tried pills in the past including Chantix as well as nicotine patches with no success. However, he states that he has not tried the nicotine gum before and knows a lot of friends who have successfully quit with the gum. He states that he has plans to try to start quitting on 07/31/2014 along with his younger brother who just suffered a large heart attack. -Encouraged patient to follow through with his plan and counseled on the risk of smoking.

## 2014-07-15 NOTE — Assessment & Plan Note (Signed)
No acute complaints. Albuterol, Advair, Spiriva all refilled.

## 2014-07-15 NOTE — Assessment & Plan Note (Signed)
No acute complaints. Nexium 20 mg daily refilled.

## 2014-07-15 NOTE — Assessment & Plan Note (Signed)
Lab Results  Component Value Date   HGBA1C 7.7 07/15/2014   HGBA1C 9.4 03/05/2014   HGBA1C 12.2 10/09/2013     Assessment: Diabetes control: fair control Progress toward A1C goal:  improved Comments: [place] and access to medications that have now since resolved. Because of this, he states that he has not been taking any medications in the last 2 months. However, he states that he has been quite aggressive in trying to pursue diet and exercise modifications. Additionally, he expresses some concern that the metformin and the insulin could be harmful to his liver, given his history of fatty liver disease.  Plan: Medications:  Continue with metformin 1000 mg twice a day. Discontinue Lantus 15 units daily at bedtime for now given that his A1c is 7.7 on no medication in the last 2 months. Home glucose monitoring: Frequency:   3 times a day Timing:   before meals Instruction/counseling given: reminded to bring blood glucose meter & log to each visit Educational resources provided:   Self management tools provided:   Other plans: Counseled patient on the lack of known association between metformin and insulin and fatty liver disease.

## 2014-07-15 NOTE — Assessment & Plan Note (Signed)
Repeat lipid panel not yet due. Lovastatin 40 mg daily at bedtime refilled

## 2014-07-15 NOTE — Patient Instructions (Addendum)
Please continue doing a good job with diet and exercise as it is doing a much better job of controlling her A1c which came back at 7.7. Your closer to goal, though not quite there yet. Please continue taking your metformin 1000 mg twice a day. We will hold off on restarting your insulin at this point. I have also refilled all of your medications by mail order.  Please also try your best to quit smoking as we have discussed. I have written 07/31/2014 as a good end date for you. We hope that you have success with this once we meet again for your next appointment.  General Instructions:   Thank you for bringing your medicines today. This helps us keep you safe from mistakes.   Progress Toward Treatment Goals:  Treatment Goal 07/15/2014  Hemoglobin A1C improved  Stop smoking smoking the same amount  Prevent falls -    Self Care Goals & Plans:  Self Care Goal 03/05/2014  Manage my medications take my medicines as prescribed; bring my medications to every visit; refill my medications on time  Monitor my health -  Eat healthy foods -  Be physically active -  Stop smoking -  Meeting treatment goals -    Home Blood Glucose Monitoring 03/05/2014  Check my blood sugar 3 times a day  When to check my blood sugar before breakfast; before lunch; before dinner     Care Management & Community Referrals:  Referral 03/05/2014  Referrals made for care management support none needed  Referrals made to community resources -

## 2014-07-15 NOTE — Progress Notes (Signed)
   Subjective:    Patient ID: [name] Newton, male    DOB: 1960-06-21, 54 y.o.   MRN: [id number]  HPI  Patient is a 54 year old gentleman with a history of diabetes, tobacco abuse, coronary artery disease, hyperlipidemia presents for a routine follow-up.  Please refer to separate problem-list charting for more details.  Review of Systems  Constitutional: Negative for fever and chills.  HENT: Negative for sore throat.   Respiratory: Negative for shortness of breath.   Cardiovascular: Negative for chest pain and palpitations.  Gastrointestinal: Negative for nausea, vomiting, abdominal pain, diarrhea, constipation and blood in stool.  Genitourinary: Negative for dysuria and hematuria.  Skin: Negative for rash.  Neurological: Negative for syncope.  Psychiatric/Behavioral: Negative for suicidal ideas.       Objective:   Physical Exam  Constitutional: He is oriented to person, place, and time. He appears well-developed and well-nourished. No distress.  HENT:  Head: Normocephalic and atraumatic.  Eyes: EOM are normal. Pupils are equal, round, and reactive to light. No scleral icterus.  Neck: Normal range of motion. Neck supple. No thyromegaly present.  Cardiovascular: Normal rate and regular rhythm.  Exam reveals no gallop and no friction rub.   No murmur heard. Pulmonary/Chest: Effort normal and breath sounds normal. No respiratory distress. He has no wheezes. He has no rales.  Abdominal: Soft. Bowel sounds are normal. He exhibits no distension. There is no tenderness. There is no rebound.  Musculoskeletal: Normal range of motion. He exhibits no edema.  Neurological: He is alert and oriented to person, place, and time. No cranial nerve deficit.  Skin: No rash noted.          Assessment & Plan:  Please refer to separate problem-list charting for more details.

## 2014-07-16 NOTE — Progress Notes (Signed)
INTERNAL MEDICINE TEACHING ATTENDING ADDENDUM - [name] Evelyn, MD: I reviewed and discussed at the time of visit with the resident Dr. Ngo, the patient's medical history, physical examination, diagnosis and results of pertinent tests and treatment and I agree with the patient's care as documented.  

## 2014-09-17 ENCOUNTER — Encounter: Payer: Medicaid Other | Admitting: Internal Medicine

## 2014-09-24 ENCOUNTER — Ambulatory Visit (INDEPENDENT_AMBULATORY_CARE_PROVIDER_SITE_OTHER): Payer: Medicare Other | Admitting: Internal Medicine

## 2014-09-24 ENCOUNTER — Other Ambulatory Visit: Payer: Self-pay | Admitting: Internal Medicine

## 2014-09-24 ENCOUNTER — Encounter: Payer: Self-pay | Admitting: Internal Medicine

## 2014-09-24 ENCOUNTER — Ambulatory Visit (HOSPITAL_COMMUNITY)
Admission: RE | Admit: 2014-09-24 | Discharge: 2014-09-24 | Disposition: A | Discharge status: Home / Self Care | Payer: Medicare Other | Source: Ambulatory Visit | Attending: Internal Medicine | Admitting: Internal Medicine

## 2014-09-24 VITALS — BP 121/70 | HR 91 | Temp 98.3°F | Ht 76.0 in | Wt 207.5 lb

## 2014-09-24 DIAGNOSIS — IMO0002 Reserved for concepts with insufficient information to code with codable children: Secondary | ICD-10-CM

## 2014-09-24 DIAGNOSIS — E1140 Type 2 diabetes mellitus with diabetic neuropathy, unspecified: Secondary | ICD-10-CM

## 2014-09-24 DIAGNOSIS — I493 Ventricular premature depolarization: Secondary | ICD-10-CM | POA: Insufficient documentation

## 2014-09-24 DIAGNOSIS — Z9119 Patient's noncompliance with other medical treatment and regimen: Secondary | ICD-10-CM

## 2014-09-24 DIAGNOSIS — I200 Unstable angina: Secondary | ICD-10-CM

## 2014-09-24 DIAGNOSIS — Z7982 Long term (current) use of aspirin: Secondary | ICD-10-CM

## 2014-09-24 DIAGNOSIS — E1165 Type 2 diabetes mellitus with hyperglycemia: Secondary | ICD-10-CM

## 2014-09-24 DIAGNOSIS — I2510 Atherosclerotic heart disease of native coronary artery without angina pectoris: Secondary | ICD-10-CM

## 2014-09-24 DIAGNOSIS — E785 Hyperlipidemia, unspecified: Secondary | ICD-10-CM

## 2014-09-24 LAB — TROPONIN I: Troponin I: <0.03 ng/mL (ref <0.031)

## 2014-09-24 LAB — GLUCOSE, CAPILLARY: Glucose-Capillary: 93 mg/dL (ref 70–99)

## 2014-09-24 LAB — CK TOTAL AND CKMB (NOT AT ARMC)
CK, MB: 2 ng/mL (ref 0.3–4.0)
Relative Index: 1.4 (ref 0.0–2.5)
Total CK: 139 U/L (ref 7–232)

## 2014-09-24 MED ORDER — CARVEDILOL 3.125 MG PO TABS: 3.1250 mg | ORAL_TABLET | Freq: Two times a day (BID) | ORAL | Status: DC

## 2014-09-24 NOTE — Assessment & Plan Note (Signed)
Cont with Lovastatin. He ideally should be on a high intensity statin but he has problems with affording his medications and all high intensity statin are not on [facility] $4 list.

## 2014-09-24 NOTE — Patient Instructions (Addendum)
General Instructions: Please be sure to come into the Emergency department as soon as you can because you are having an ongoing heart attack Please take Coreg 3.125 mg twice a day Please call 911 in case your chest pain intensifies  Please bring your medicines with you each time you come to clinic.  Medicines may include prescription medications, over-the-counter medications, herbal remedies, eye drops, vitamins, or other pills.   Progress Toward Treatment Goals:  Treatment Goal 09/24/2014  Hemoglobin A1C unchanged  Stop smoking -  Prevent falls -    Self Care Goals & Plans:  Self Care Goal 09/24/2014  Manage my medications take my medicines as prescribed; bring my medications to every visit; refill my medications on time  Monitor my health -  Eat healthy foods drink diet soda or water instead of juice or soda; eat more vegetables; eat foods that are low in salt; eat baked foods instead of fried foods; eat fruit for snacks and desserts  Be physically active -  Stop smoking -  Meeting treatment goals -    Home Blood Glucose Monitoring 09/24/2014  Check my blood sugar 2 times a day  When to check my blood sugar before breakfast; before lunch; before dinner     Care Management & Community Referrals:  Referral 03/05/2014  Referrals made for care management support none needed  Referrals made to community resources -

## 2014-09-24 NOTE — Progress Notes (Signed)
Patient ID: [name] Newton, male   DOB: May 06, 1961, 54 y.o.   MRN: [id number]   Subjective:   HPI: [name] Newton is a 54 y.o. gentleman with past medical history below presents for an acute visit due to chest pain.  Reason(s) for visit:  Chest pain: Patient reports ongoing chest pain for the past 2 weeks. He describes his chest pain as sharp, located on the left side, sometimes associated with diaphoresis. The pain can reach an intensity of 7/10. Pain does not radiate to the left upper extremity or jaw. It is intermittent and sometimes exertional, but also occurs at rest. Sometimes the chest pain as occurred when he is working on his yard requiring him to stop for some minutes before the chest pain resolved. He has had episodes lasting up to 30 minutes. Sublingual Nitroglycerin has not been helpful in some cases. He denies wheezing, cough, fevers or chills. His appetite and energy level have been normal. He denies PND, orthopnea, or increased leg edema. He denies palpitations and he has not passed out. Patient quit smoking about a month ago and is using nicotine chewing gum and he thought that his chest pain is related to smoking cessation.  Patient has significant history of CAD requiring stent placement in May 2012. He has not followed up with cardiology since 2013 in October. Patient is not currently on a beta blocker. He takes aspirin and a statin.  Kindly see the A&P for the status of the pt's chronic medical problems.    Past Medical History  Diagnosis Date  . Diabetes mellitus   . Tobacco abuse   . Hyperlipidemia   . Hypertension   . NSTEMI (non-ST elevated myocardial infarction) April 2012    status post successful PCI and bare-metal  stenting of the first diagonal this admission.   . Guaiac positive stools      Guaiac positive stool with stable H and H.     ROS: Constitutional:  Denies fevers, chills, diaphoresis, appetite change and fatigue.  GI: No  abdominal pain, nausea, vomiting, bloody stools GU: No dysuria, frequency, hematuria, or flank pain.  MSK: No myalgias, back pain, joint swelling, arthralgias  Psych: No depression symptoms. No SI or SA.    Objective:  Physical Exam: Filed Vitals:   09/24/14 1415  BP: 121/70  Pulse: 91  Temp: 98.3 F (36.8 C)  TempSrc: Oral  Height: 6\' 4"  (1.93 m)  Weight: 207 lb 8 oz (94.121 kg)  SpO2: 99%   General: Well nourished. No acute distress.  HEENT: Normal oral mucosa. MMM.  Lungs: CTA bilaterally. No chest wall tenderness. No wheezing. Heart: Slight tachycardia of 91. Otherwise normal rhythm; no extra sounds or murmurs. JVD nondistended. Abdomen: Non-distended, normal bowel sounds, soft, nontender; no hepatosplenomegaly  Extremities: No pedal edema. No joint swelling or tenderness. Neurologic: Normal EOM,  Alert and oriented x3. No obvious neurologic/cranial nerve deficits.  Assessment & Plan:  Discussed case with Dr Josem KaufmannKlima. See problem based charting for assessment and plan.

## 2014-09-24 NOTE — Assessment & Plan Note (Signed)
Assessment: Most likely diagnosis is unstable angina based on his clinical history and his risk factors. Description of his chest pain sounds typical for cardiac ischemia >unstable angina. Patient has known CAD with stent placement in 2012. He has not followed up with cardiology since 2013. Physical exam is otherwise normal without increased volume except for tachycardia of 91. He appears to be comfortable at this time, but he had an episode of chest pain just before this clinic visit. His TIMI risk score for UA/STEMI is 4-5, predicting 20-26% risk for all causes mortality within the next 14 days. Wells score for PE is 0. We performed a stat EKG which revealed at least a 1 mm ST segment elevation in lead II and III. He had some slight changes in the EKG performed on 09/07/2011. Also noted pseudonormalization of the T-wave in lead III and possibly in aVF. I discussed with my attending, Dr Josem KaufmannKlima while the patient was still in the clinic. We felt that patient needed to be admitted, given that his symptoms and EKG findings which are concerning for cardiac ischemia. Patient, however, indicated that he was unable to be admitted today since he has take care of his friend who is seeing a doctor for consideration of hemodialysis with an appointment today. Discussed with the patient about risks of him going home without treatment with active cardiac ischemia including the possibility of death. Patient verbalized understanding of his risks but still could not be admitted due to the above circumstances. He stated that his father died from a heart attack so he understands his risks. I encouraged the patient to go to the emergency department if his symptoms worsen or make an appointment [facility] as soon as possible. Patient signed AMA (against medical advice) papers and left. I prescribed Coreg 3.125 mg twice a day since he wasn't on a beta blocker.  Plan: 1. Labs/imaging: EKG. Trops and CK-MB both normal 2. Therapy: Start  Coreg 3.125 mg bid. Continue with ASA and Lovastatin.  3. Follow up: call 911 or return to clinic when ready. Patient left AMA.

## 2014-09-24 NOTE — Progress Notes (Signed)
Thanks Marjan!

## 2014-09-24 NOTE — Assessment & Plan Note (Signed)
Patient with UA symptoms but he was unable to stay in hospital due to social reasons.

## 2014-09-24 NOTE — Progress Notes (Signed)
Pt called at 4:50 PM stating he did not receive prescription for carvedilol 3.125 mg BID [facility] [address]. It was instead sent to his mail-order pharmacy. I sent in the prescription to his preferred pharmacy.   Dr. Johna Rolesabbani

## 2014-09-24 NOTE — Assessment & Plan Note (Signed)
Lab Results  Component Value Date   HGBA1C 7.7 07/15/2014   HGBA1C 9.4 03/05/2014   HGBA1C 12.2 10/09/2013     Assessment: Diabetes control: fair control Progress toward A1C goal:  unchanged Comments: compliant with his treatment. He has lost 10 pounds in the last few months.   Plan: Medications:  Metformin 1000 mg bid Home glucose monitoring: Frequency: 2 times a day Timing: before breakfast, before lunch, before dinner Instruction/counseling given: reminded to bring blood glucose meter & log to each visit Educational resources provided: brochure (denies) Self management tools provided:   Other plans: none. Congratulated him on smoking cessation and weight loss efforts.

## 2014-09-28 NOTE — Progress Notes (Signed)
Case discussed with Dr. Zada GirtKazibwe at time of visit.  We reviewed the resident's history and exam and pertinent patient test results.  I agree with the assessment, diagnosis, and plan of care documented in the resident's note.  We recommended to the patient admission to the hospital given our concern for an acute coronary artery syndrome.  He was explained the risks of leaving against medical advice, including death, and understood the risks stating his father died of a heart attack.  We will add coreg as he should be on a beta blocker for secondary prevention and to lower heart rate to < 65.  He was encouraged to return immediately should his symptoms persist or his personal circumstances changes that allowed him to be admitted.

## 2014-10-02 ENCOUNTER — Other Ambulatory Visit: Payer: Self-pay | Admitting: *Deleted

## 2014-10-02 DIAGNOSIS — E1165 Type 2 diabetes mellitus with hyperglycemia: Secondary | ICD-10-CM

## 2014-10-02 DIAGNOSIS — IMO0002 Reserved for concepts with insufficient information to code with codable children: Secondary | ICD-10-CM

## 2014-10-06 MED ORDER — ACCU-CHEK AVIVA PLUS W/DEVICE KIT: 1.0000 | PACK | Freq: Every day | Status: DC

## 2014-10-10 ENCOUNTER — Telehealth: Payer: Self-pay | Admitting: *Deleted

## 2014-10-10 MED ORDER — NITROGLYCERIN 0.4 MG SL SUBL: 0.4000 mg | SUBLINGUAL_TABLET | SUBLINGUAL | Status: DC | PRN

## 2014-10-10 NOTE — Telephone Encounter (Signed)
Pt request NTG, it is not on his med list but he has received in past. He talked about this at last visit 4/27

## 2014-10-13 ENCOUNTER — Encounter: Payer: Self-pay | Admitting: *Deleted

## 2014-10-15 ENCOUNTER — Other Ambulatory Visit: Payer: Self-pay | Admitting: Internal Medicine

## 2014-11-24 ENCOUNTER — Other Ambulatory Visit: Payer: Self-pay

## 2014-12-11 ENCOUNTER — Telehealth: Payer: Self-pay | Admitting: Internal Medicine

## 2014-12-11 NOTE — Telephone Encounter (Signed)
Call to patient to confirm appointment for 12/15/14 at 3:45 lmtcb

## 2014-12-15 ENCOUNTER — Encounter: Payer: Medicare Other | Admitting: Internal Medicine

## 2014-12-15 ENCOUNTER — Ambulatory Visit (INDEPENDENT_AMBULATORY_CARE_PROVIDER_SITE_OTHER): Payer: Medicare Other | Admitting: Internal Medicine

## 2014-12-15 ENCOUNTER — Encounter: Payer: Self-pay | Admitting: Internal Medicine

## 2014-12-15 VITALS — BP 124/65 | HR 78 | Temp 97.5°F | Ht 76.0 in | Wt 213.9 lb

## 2014-12-15 DIAGNOSIS — IMO0002 Reserved for concepts with insufficient information to code with codable children: Secondary | ICD-10-CM

## 2014-12-15 DIAGNOSIS — S59909A Unspecified injury of unspecified elbow, initial encounter: Secondary | ICD-10-CM | POA: Insufficient documentation

## 2014-12-15 DIAGNOSIS — J449 Chronic obstructive pulmonary disease, unspecified: Secondary | ICD-10-CM

## 2014-12-15 DIAGNOSIS — E1140 Type 2 diabetes mellitus with diabetic neuropathy, unspecified: Secondary | ICD-10-CM

## 2014-12-15 DIAGNOSIS — Z87891 Personal history of nicotine dependence: Secondary | ICD-10-CM

## 2014-12-15 DIAGNOSIS — I200 Unstable angina: Secondary | ICD-10-CM

## 2014-12-15 DIAGNOSIS — S59901A Unspecified injury of right elbow, initial encounter: Secondary | ICD-10-CM

## 2014-12-15 DIAGNOSIS — I252 Old myocardial infarction: Secondary | ICD-10-CM

## 2014-12-15 DIAGNOSIS — Z79899 Other long term (current) drug therapy: Secondary | ICD-10-CM | POA: Diagnosis not present

## 2014-12-15 DIAGNOSIS — I209 Angina pectoris, unspecified: Secondary | ICD-10-CM

## 2014-12-15 DIAGNOSIS — E1165 Type 2 diabetes mellitus with hyperglycemia: Secondary | ICD-10-CM | POA: Diagnosis not present

## 2014-12-15 DIAGNOSIS — E1149 Type 2 diabetes mellitus with other diabetic neurological complication: Secondary | ICD-10-CM

## 2014-12-15 DIAGNOSIS — E785 Hyperlipidemia, unspecified: Secondary | ICD-10-CM

## 2014-12-15 LAB — LIPID PANEL
Cholesterol: 309 mg/dL — ABNORMAL HIGH (ref 0–200)
HDL: 31 mg/dL — ABNORMAL LOW (ref >=40)
Total CHOL/HDL Ratio: 10 Ratio
Triglycerides: 685 mg/dL — ABNORMAL HIGH (ref <150)

## 2014-12-15 LAB — GLUCOSE, CAPILLARY: Glucose-Capillary: 147 mg/dL — ABNORMAL HIGH (ref 65–99)

## 2014-12-15 LAB — POCT GLYCOSYLATED HEMOGLOBIN (HGB A1C)
Hemoglobin A1C: 8.1
Hemoglobin A1C: 8.1

## 2014-12-15 MED ORDER — FLUTICASONE-SALMETEROL 100-50 MCG/DOSE IN AEPB: 1.0000 | INHALATION_SPRAY | Freq: Two times a day (BID) | RESPIRATORY_TRACT | Status: DC

## 2014-12-15 MED ORDER — METFORMIN HCL 1000 MG PO TABS: 1000.0000 mg | ORAL_TABLET | Freq: Two times a day (BID) | ORAL | Status: DC

## 2014-12-15 MED ORDER — ALBUTEROL SULFATE HFA 108 (90 BASE) MCG/ACT IN AERS: 2.0000 | INHALATION_SPRAY | Freq: Four times a day (QID) | RESPIRATORY_TRACT | Status: DC | PRN

## 2014-12-15 MED ORDER — LOVASTATIN 40 MG PO TABS: 40.0000 mg | ORAL_TABLET | Freq: Every day | ORAL | Status: DC

## 2014-12-15 MED ORDER — PREGABALIN 50 MG PO CAPS: 50.0000 mg | ORAL_CAPSULE | Freq: Three times a day (TID) | ORAL | Status: DC

## 2014-12-15 MED ORDER — GLIPIZIDE 5 MG PO TABS: 2.5000 mg | ORAL_TABLET | Freq: Every day | ORAL | Status: DC

## 2014-12-15 NOTE — Progress Notes (Signed)
Internal Medicine Clinic Attending  I saw and evaluated the patient.  I personally confirmed the key portions of the history and exam documented by Dr. Johnny BridgeSaraiya and I reviewed pertinent patient test results.  The assessment, diagnosis, and plan were formulated together and I agree with the documentation in the resident's note.  54 year old man who has intermittent chest pain/unstable angina, history of previous MI with stenting, currently not having chest pain but not agreeing to an EKG, any new cardiac work up or medication, not agreeing to going to ER, because of financial reasons. Discussed with the patient for a very long time with Dr Johnny BridgeSaraiya. Explained risks. Patient wants only diabetic work up. No cardiac work up. We made him agree to a cardiology referral and in the meantime we will ask Lynnae JanuaryShana Grady to talk to him and discuss his options.  For diabetes - add glipizide low dose for elevated A1c on top of metformin.  Aletta EdouardShilpa Laityn Bensen MD MPH 12/15/2014 5:22 PM

## 2014-12-15 NOTE — Patient Instructions (Addendum)
Thank you for your visit  For your diabetes, we have added glipizide 2.5 mg, which you take daily.   We will refer you to cardiology. The clinic will call you to set up an appointment date and time. We strongly urge you to go there to have your heart evaluated.  If you have chest pain, our advice is for you to immediately go to the ER.  Angina Pectoris Angina pectoris, often just called angina, is extreme discomfort in your chest, neck, or arm caused by a lack of blood in the middle and thickest layer of your heart wall (myocardium). It may feel like tightness or heavy pressure. It may feel like a crushing or squeezing pain. Some people say it feels like gas or indigestion. It may go down your shoulders, back, and arms. Some people may have symptoms other than pain. These symptoms include fatigue, shortness of breath, cold sweats, or nausea. There are four different types of angina:  Stable angina--Stable angina usually occurs in episodes of predictable frequency and duration. It usually is brought on by physical activity, emotional stress, or excitement. These are all times when the myocardium needs more oxygen. Stable angina usually lasts a few minutes and often is relieved by taking a medicine that can be taken under your tongue (sublingually). The medicine is called nitroglycerin. Stable angina is caused by a buildup of plaque inside the arteries, which restricts blood flow to the heart muscle (atherosclerosis).  Unstable angina--Unstable angina can occur even when your body experiences little or no physical exertion. It can occur during sleep. It can also occur at rest. It can suddenly increase in severity or frequency. It might not be relieved by sublingual nitroglycerin. It can last up to 30 minutes. The most common cause of unstable angina is a blood clot that has developed on the top of plaque buildup inside a coronary artery. It can lead to a heart attack if the blood clot completely blocks  the artery.  Microvascular angina--This type of angina is caused by a disorder of tiny blood vessels called arterioles. Microvascular angina is more common in women. The pain may be more severe and last longer than other types of angina pectoris.  Prinzmetal or variant angina--This type of angina pectoris usually occurs when your body experiences little or no physical exertion. It especially occurs in the early morning hours. It is caused by a spasm of your coronary artery. HOME CARE INSTRUCTIONS   Only take over-the-counter and prescription medicines as directed by your health care provider.  Stay active or increase your exercise as directed by your health care provider.  Limit strenuous activity as directed by your health care provider.  Limit heavy lifting as directed by your health care provider.  Maintain a healthy weight.  Learn about and eat heart-healthy foods.  Do not use any tobacco products including cigarettes, chewing tobacco or electronic cigarettes. SEEK IMMEDIATE MEDICAL CARE IF:  You experience the following symptoms:  Chest, neck, deep shoulder, or arm pain or discomfort that lasts more than a few minutes.  Chest, neck, deep shoulder, or arm pain or discomfort that goes away and comes back, repeatedly.  Heavy sweating with discomfort, without a noticeable cause.  Shortness of breath or difficulty breathing.  Angina that does not get better after a few minutes of rest or after taking sublingual nitroglycerin. These can all be symptoms of a heart attack, which is a medical emergency! Get medical help at once. Call your local emergency service [phone]  in [place] immediately. Do not  drive yourself to the hospital and do not  wait to for your symptoms to go away. MAKE SURE YOU:  Understand these instructions.  Will watch your condition.  Will get help right away if you are not doing well or get worse. Document Released: 05/16/2005 Document Revised: 05/21/2013 Document  Reviewed: 09/17/2013 [facility] Patient Information 2015 [place]. This information is not intended to replace advice given to you by your health care provider. Make sure you discuss any questions you have with your health care provider.

## 2014-12-15 NOTE — Assessment & Plan Note (Signed)
Lab Results  Component Value Date   HGBA1C 8.1 12/15/2014   HGBA1C 8.1 12/15/2014   HGBA1C 7.7 07/15/2014    His A1c has been trending up over the last few years. He is on metformin 1000 bid. His A1c today is 8.1 We started him on glipizide 2.5 mg daily, and will follow up with him in 2-4 weeks.  We refilled his meds

## 2014-12-15 NOTE — Assessment & Plan Note (Addendum)
Patient told me that he is having chest pain 3 times a week. He takes nitroglycerin and it subsides within few minutes, it does not radiate down the arms, and it is a sharp pain. The last episode was chest pain was this past Friday, and it lasted few minutes longer after him taking nitroglycerin. Currently in the clinic, he was not having chest pain. PE was benign, no tachycardia, no increased RR. Wells score 0.   Currently on coreg, ASA and lovastatin-pt did not have lovastatin so refilled it.  It is likely he has cardiac ischemia//UA. His last EKG was back in April and it showed St elevation in leads 2 and 3. He also has a history of stent in 2012, and a lot of risk factors that can lead to MI.  I advised him to do a stat EKG in the clinic to evaluate the changes in EKG, and the patient refused as he said that he is not able to afford it. I told him that we have resources in the clinic like social worker who can help with the payment later on, as this is a pressing matter as the finances can be sorted out later, but he declined any workup.  I told him that eventually the nitroglycerin may stop working and it will cause an emergency later down the line.  We also advised him to see cardiologist if he didn't want to be treated here and pt says he will think about it.  Both Dr. Dalphine HandingBhardwaj and I advised him that declining to do EKG and to do further workup including hospital admission if needed may result in full blown heart attack and possible death, if left untreated. The patient seemed competent  And rational to make choices   Also put social work referral to Ms Mosetta PuttGrady who will f/u

## 2014-12-15 NOTE — Progress Notes (Signed)
Patient ID: [name] Newton, male   DOB: [birth year], 54 y.o.   MRN: [id number]    Subjective:   Patient ID: [name] Newton male   DOB: 04-15-1961 54 y.o.   MRN: [id number]  HPI: [name] is a 54 y.o. man with notable PMH listed below, here for diabetes mgmt, chest pain 3 times a week, and elbow infection.    Past Medical History  Diagnosis Date  . Diabetes mellitus   . Tobacco abuse   . Hyperlipidemia   . Hypertension   . NSTEMI (non-ST elevated myocardial infarction) April 2012    status post successful PCI and bare-metal  stenting of the first diagonal this admission.   . Guaiac positive stools      Guaiac positive stool with stable H and H.    Current Outpatient Prescriptions  Medication Sig Dispense Refill  . albuterol (PROAIR HFA) 108 (90 BASE) MCG/ACT inhaler Inhale 2 puffs into the lungs every 6 (six) hours as needed for wheezing or shortness of breath. 2 Inhaler 6  . aspirin 81 MG tablet Take 1 tablet (81 mg total) by mouth daily as needed. 90 tablet 4  . Blood Glucose Monitoring Suppl (ACCU-CHEK AVIVA PLUS) W/DEVICE KIT Use as directed 1 kit 2  . carvedilol (COREG) 3.125 MG tablet Take 1 tablet (3.125 mg total) by mouth 2 (two) times daily with a meal. 60 tablet 1  . esomeprazole (NEXIUM) 20 MG capsule Take 1 capsule (20 mg total) by mouth daily. 60 capsule 6  . Fluticasone-Salmeterol (ADVAIR DISKUS) 100-50 MCG/DOSE AEPB Inhale 1 puff into the lungs every 12 (twelve) hours. 14 each 12  . glucose blood test strip Use to check blood sugar up to 1 times a day before  Meals Dx code- 250.00 100 each 12  . metFORMIN (GLUCOPHAGE) 1000 MG tablet Take 1 tablet (1,000 mg total) by mouth 2 (two) times daily with a meal. 120 tablet 6  . nitroGLYCERIN (NITROSTAT) 0.4 MG SL tablet Place 1 tablet (0.4 mg total) under the tongue every 5 (five) minutes as needed for chest pain. 30 tablet 0  . pregabalin (LYRICA) 50 MG capsule Take 1 capsule (50 mg total) by  mouth 3 (three) times daily. 90 capsule 2  . glipiZIDE (GLUCOTROL) 5 MG tablet Take 0.5 tablets (2.5 mg total) by mouth daily. 30 tablet 11  . lovastatin (MEVACOR) 40 MG tablet Take 1 tablet (40 mg total) by mouth at bedtime. 60 tablet 6  . tiotropium (SPIRIVA HANDIHALER) 18 MCG inhalation capsule Place 1 capsule (18 mcg total) into inhaler and inhale daily. (Patient not taking: Reported on 12/15/2014) 30 capsule 3   No current facility-administered medications for this visit.   Family History  Problem Relation Age of Onset  . Heart attack Father   . Coronary artery disease Brother   . Heart attack Brother    History   Social History  . Marital Status: Divorced    Spouse Name: N/A  . Number of Children: N/A  . Years of Education: 10   Occupational History  . none     Unemployed since 2012   Social History Main Topics  . Smoking status: Former Smoker -- 0.50 packs/day    Types: Cigarettes    Quit date: 08/24/2011  . Smokeless tobacco: Not on file     Comment: Patches do not work.  Cutting back.  Wants Chantix  . Alcohol Use: No  . Drug Use: No  . Sexual Activity: Not on file  Other Topics Concern  . None   Social History Narrative   Patient is intermittently homeless without any income at the moment.          Review of Systems: Review of Systems  Constitutional: Positive for diaphoresis. Negative for fever, chills, weight loss and malaise/fatigue.  Respiratory: Positive for shortness of breath and wheezing. Negative for cough.   Cardiovascular: Positive for chest pain. Negative for palpitations, orthopnea, leg swelling and PND.  Gastrointestinal: Positive for heartburn. Negative for nausea, vomiting, abdominal pain, diarrhea and constipation.  Neurological: Negative for dizziness, tingling, focal weakness and headaches.  Psychiatric/Behavioral: Negative for depression. The patient is nervous/anxious.     Objective:  Physical Exam: Filed Vitals:   12/15/14 1607    BP: 124/65  Pulse: 78  Temp: 97.5 F (36.4 C)  TempSrc: Axillary  Height: 6' 4" (1.93 m)  Weight: 213 lb 14.4 oz (97.024 kg)  SpO2: 96%   Physical Exam  Constitutional: He is oriented to person, place, and time. He appears well-developed and well-nourished.  Neck: Normal range of motion. Neck supple.  Cardiovascular: Normal rate, regular rhythm, S1 normal, S2 normal and intact distal pulses.  Exam reveals no S3 and no S4.   No murmur heard. Pulmonary/Chest: Effort normal and breath sounds normal. He exhibits no tenderness.  Neurological: He is alert and oriented to person, place, and time.  Skin:  Injury on the right elbow, over the olecranon , no swelling or tenderness, not warm to touch and no erythema, no purulent discharge extruded, seems to be healing up  Psychiatric: He has a normal mood and affect. His behavior is normal. Judgment and thought content normal.    Assessment & Plan:  Please see problem based charting for assessment and plan

## 2014-12-15 NOTE — Assessment & Plan Note (Signed)
Stable, continue lyrica at current regimen

## 2014-12-16 ENCOUNTER — Telehealth: Payer: Self-pay | Admitting: Licensed Clinical Social Worker

## 2014-12-16 LAB — MICROALBUMIN / CREATININE URINE RATIO
Creatinine, Urine: 179.4 mg/dL
Microalb Creat Ratio: 11.7 mg/g (ref 0.0–30.0)
Microalb, Ur: 2.1 mg/dL — ABNORMAL HIGH (ref <2.0)

## 2014-12-16 LAB — LDL CHOLESTEROL, DIRECT: Direct LDL: 152 mg/dL — ABNORMAL HIGH

## 2014-12-16 NOTE — Telephone Encounter (Signed)
[name] Newton was referred to CSW to discuss insurance and preventative healthcare.  This became a loaded conversation for [name] Newton as he is having financial issues at this time, including [facility]. During this call, [name] Newton made multiple references including "I don't care if I live or die.  I'm not gonna hurt myself.", "Everybody's gonna die sometime", "I can't talk to people.  I mean I don't have any friends and my family won't talk to me."  [name] Newton was negative when it came to his life circumstances and limited fixed income.  Pt could not be redirected to see the positives.  CSW inquired if pt would be open to counseling services.  Initially pt states he did not see the benefit, but eventually became open to it.  Stating "sign me up".  CSW will confirm pt's mental health benefits and copayments and complete referral. When CSW discussed PCP's concern for pt's need to have EKG.  Pt states [facility]  [name] Newton thought it would be included in his office visit but received a separate bill with a $50 copayment.  Initially, PCP thought pt had no coverage.  However, pt has a copayment which he states he can not afford due to prior debt.  Pt in agreement for all medical are that is covered with more affordable copayments.  Pt states he has Medicare and Medicaid, but EMR only reflects Medicare.  [name] Newton completed his redetermination in April and received his new Medicaid card in May 2016.  When pt inquired if medicaid would cover the previous copayment, he was told Medicaid covered all it could.   [name] Newton owns his own means of transportation which is a cost to him, as well.  Pt voiced complaints that it is expensive to eat healthy.  He received $12 in food stamps, which he thought was no worth reapplying. CSW will follow up with mental health referral and [place]

## 2014-12-16 NOTE — Addendum Note (Signed)
Addended by: Deneise LeverSARAIYA, Hagan Vanauken A on: 12/16/2014 08:45 AM   Modules accepted: Orders

## 2014-12-16 NOTE — Telephone Encounter (Signed)
Confirmed with front office.  Pt is not active with Medicaid.  [name] present a card from 2014.

## 2014-12-17 NOTE — Telephone Encounter (Signed)
Thank you [name] SnowballShana for following up on this. The patient appeared depressed/teary although did not have any SI or HI at the time of the visit. However, he was competent and could make his decisions and decided not to do any cardiac work up despite our advice. I was aware that the patient has insurance however I was not sure why his prior EKG was not covered by insurance. We have referred him for an outpatient cardiology evaluation. I am not sure what his cardiology referral copay might be, and he probably would decide based on that whether to show up for the evaluation or not. If someone can address this with the patient, it would be great. Thanks.  [name] EdouardShilpa Mennie Spiller MD MPH 12/17/2014 11:15 AM

## 2014-12-22 ENCOUNTER — Telehealth: Payer: Self-pay | Admitting: Licensed Clinical Social Worker

## 2014-12-22 NOTE — Telephone Encounter (Signed)
CSW placed call to Mr. Winsor to follow up on behavioral health services.  CSW informed pt unable to confirm his medicaid at this time.  If referral made under insurance, pt would have a specialist copayment.  CSW discussed referral to [facility], agency that provides free counseling to underinsured with mental health and substance abuse (tobacco for Mr. Gries).  Pt in agreement.  Referral to [facility] completed.  [facility] to f/u with this worker as to if services can be provided to Mr. Vanderlinde.

## 2014-12-23 NOTE — Telephone Encounter (Signed)
Pt can be seen at [facility] and [facility] with a $25/copayment.  Will forward information to Mr. Rossman.

## 2015-01-06 ENCOUNTER — Encounter: Payer: Self-pay | Admitting: *Deleted

## 2015-01-07 ENCOUNTER — Telehealth: Payer: Self-pay | Admitting: Internal Medicine

## 2015-01-07 NOTE — Telephone Encounter (Signed)
New message    Call patient regarding epic new patient referral for chest pain.  Patient stated he did not need this appt - he was not having any chest pain.

## 2015-01-27 NOTE — Addendum Note (Signed)
Addended by: Neomia Dear on: 01/27/2015 05:35 PM   Modules accepted: Orders

## 2015-02-26 ENCOUNTER — Encounter: Payer: Self-pay | Admitting: Internal Medicine

## 2015-02-26 ENCOUNTER — Ambulatory Visit (INDEPENDENT_AMBULATORY_CARE_PROVIDER_SITE_OTHER): Payer: Medicare Other | Admitting: Internal Medicine

## 2015-02-26 DIAGNOSIS — IMO0002 Reserved for concepts with insufficient information to code with codable children: Secondary | ICD-10-CM

## 2015-02-26 DIAGNOSIS — Z7982 Long term (current) use of aspirin: Secondary | ICD-10-CM | POA: Diagnosis not present

## 2015-02-26 DIAGNOSIS — E1165 Type 2 diabetes mellitus with hyperglycemia: Secondary | ICD-10-CM

## 2015-02-26 DIAGNOSIS — J449 Chronic obstructive pulmonary disease, unspecified: Secondary | ICD-10-CM

## 2015-02-26 DIAGNOSIS — E119 Type 2 diabetes mellitus without complications: Secondary | ICD-10-CM | POA: Diagnosis not present

## 2015-02-26 DIAGNOSIS — I25119 Atherosclerotic heart disease of native coronary artery with unspecified angina pectoris: Secondary | ICD-10-CM

## 2015-02-26 DIAGNOSIS — Z79899 Other long term (current) drug therapy: Secondary | ICD-10-CM | POA: Diagnosis not present

## 2015-02-26 DIAGNOSIS — I200 Unstable angina: Secondary | ICD-10-CM

## 2015-02-26 DIAGNOSIS — E785 Hyperlipidemia, unspecified: Secondary | ICD-10-CM

## 2015-02-26 DIAGNOSIS — Z87891 Personal history of nicotine dependence: Secondary | ICD-10-CM

## 2015-02-26 DIAGNOSIS — K219 Gastro-esophageal reflux disease without esophagitis: Secondary | ICD-10-CM

## 2015-02-26 MED ORDER — FLUTICASONE-SALMETEROL 100-50 MCG/DOSE IN AEPB: 1.0000 | INHALATION_SPRAY | Freq: Two times a day (BID) | RESPIRATORY_TRACT | Status: DC

## 2015-02-26 MED ORDER — GLIPIZIDE 5 MG PO TABS
2.5000 mg | ORAL_TABLET | Freq: Every day | ORAL | Status: DC
Start: 2015-02-26 — End: 2015-04-02

## 2015-02-26 MED ORDER — ESOMEPRAZOLE MAGNESIUM 20 MG PO CPDR: 20.0000 mg | DELAYED_RELEASE_CAPSULE | Freq: Every day | ORAL | Status: DC

## 2015-02-26 MED ORDER — NITROGLYCERIN 0.4 MG SL SUBL: 0.4000 mg | SUBLINGUAL_TABLET | SUBLINGUAL | Status: DC | PRN

## 2015-02-26 MED ORDER — METFORMIN HCL 1000 MG PO TABS: 1000.0000 mg | ORAL_TABLET | Freq: Two times a day (BID) | ORAL | Status: DC

## 2015-02-26 MED ORDER — ACCU-CHEK AVIVA PLUS W/DEVICE KIT: PACK | Status: DC

## 2015-02-26 MED ORDER — PREGABALIN 50 MG PO CAPS: 50.0000 mg | ORAL_CAPSULE | Freq: Three times a day (TID) | ORAL | Status: DC

## 2015-02-26 MED ORDER — GLUCOSE BLOOD VI STRP: ORAL_STRIP | Status: DC

## 2015-02-26 MED ORDER — ALBUTEROL SULFATE HFA 108 (90 BASE) MCG/ACT IN AERS: 2.0000 | INHALATION_SPRAY | Freq: Four times a day (QID) | RESPIRATORY_TRACT | Status: DC | PRN

## 2015-02-26 MED ORDER — LOVASTATIN 40 MG PO TABS: 40.0000 mg | ORAL_TABLET | Freq: Every day | ORAL | Status: DC

## 2015-02-26 MED ORDER — CARVEDILOL 3.125 MG PO TABS: 3.1250 mg | ORAL_TABLET | Freq: Two times a day (BID) | ORAL | Status: DC

## 2015-02-26 MED ORDER — ASPIRIN 81 MG PO TABS: 81.0000 mg | ORAL_TABLET | Freq: Every day | ORAL | Status: DC | PRN

## 2015-02-26 NOTE — Assessment & Plan Note (Signed)
Pt  On metformin 1000 bid, and glipizide 2.5 mg daily Does not really check cbgs, has not experienced any lows. Lab Results  Component Value Date   HGBA1C 8.1 12/15/2014   HGBA1C 8.1 12/15/2014   HGBA1C 7.7 07/15/2014    Asked pt to bring glucometer and check cbgs Will check A1c at next visit

## 2015-02-26 NOTE — Progress Notes (Addendum)
Patient ID: [name] Newton, male   DOB: 1960-06-25, 54 y.o.   MRN: [id number]    Subjective:   Patient ID: [name] Newton male   DOB: 1960-11-08 54 y.o.   MRN: [id number]  HPI: [name] Newton is a 54 y.o.  Man with T2DM, h/o CAD with stents in 2012 with no follows to cardiology since then, HTN, COPD, here for medication refill.  Please see problem list/A&P for the status of the patient's chronic medical problems.  Past Medical History  Diagnosis Date  . Diabetes mellitus   . Tobacco abuse   . Hyperlipidemia   . Hypertension   . NSTEMI (non-ST elevated myocardial infarction) April 2012    status post successful PCI and bare-metal  stenting of the first diagonal this admission.   . Guaiac positive stools      Guaiac positive stool with stable H and H.    Current Outpatient Prescriptions  Medication Sig Dispense Refill  . albuterol (PROAIR HFA) 108 (90 BASE) MCG/ACT inhaler Inhale 2 puffs into the lungs every 6 (six) hours as needed for wheezing or shortness of breath. 2 Inhaler 6  . aspirin 81 MG tablet Take 1 tablet (81 mg total) by mouth daily as needed. 90 tablet 4  . Blood Glucose Monitoring Suppl (ACCU-CHEK AVIVA PLUS) W/DEVICE KIT Use as directed 1 kit 2  . carvedilol (COREG) 3.125 MG tablet Take 1 tablet (3.125 mg total) by mouth 2 (two) times daily with a meal. 60 tablet 1  . esomeprazole (NEXIUM) 20 MG capsule Take 1 capsule (20 mg total) by mouth daily. 60 capsule 6  . Fluticasone-Salmeterol (ADVAIR DISKUS) 100-50 MCG/DOSE AEPB Inhale 1 puff into the lungs every 12 (twelve) hours. 14 each 12  . glipiZIDE (GLUCOTROL) 5 MG tablet Take 0.5 tablets (2.5 mg total) by mouth daily. 30 tablet 11  . glucose blood test strip Use to check blood sugar up to 1 times a day before  Meals Dx code- 250.00 100 each 12  . lovastatin (MEVACOR) 40 MG tablet Take 1 tablet (40 mg total) by mouth at bedtime. 60 tablet 6  . metFORMIN (GLUCOPHAGE) 1000 MG tablet Take 1  tablet (1,000 mg total) by mouth 2 (two) times daily with a meal. 120 tablet 6  . nitroGLYCERIN (NITROSTAT) 0.4 MG SL tablet Place 1 tablet (0.4 mg total) under the tongue every 5 (five) minutes as needed for chest pain. 30 tablet 0  . pregabalin (LYRICA) 50 MG capsule Take 1 capsule (50 mg total) by mouth 3 (three) times daily. 90 capsule 2  . tiotropium (SPIRIVA HANDIHALER) 18 MCG inhalation capsule Place 1 capsule (18 mcg total) into inhaler and inhale daily. (Patient not taking: Reported on 12/15/2014) 30 capsule 3   No current facility-administered medications for this visit.   Family History  Problem Relation Age of Onset  . Heart attack Father   . Coronary artery disease Brother   . Heart attack Brother    Social History   Social History  . Marital Status: Divorced    Spouse Name: N/A  . Number of Children: N/A  . Years of Education: 10   Occupational History  . none     Unemployed since 2012   Social History Main Topics  . Smoking status: Former Smoker -- 0.50 packs/day    Types: Cigarettes    Quit date: 08/24/2011  . Smokeless tobacco: None     Comment: Patches do not work.  Cutting back.  Wants Chantix  .  Alcohol Use: No  . Drug Use: No  . Sexual Activity: Not Asked   Other Topics Concern  . None   Social History Narrative   Patient is intermittently homeless without any income at the moment.          Review of Systems: Review of Systems  Constitutional: Negative.   Respiratory: Negative for cough, sputum production, shortness of breath and wheezing.   Cardiovascular: Negative for chest pain, palpitations, orthopnea, claudication, leg swelling and PND.  Neurological: Negative.     Objective:  Physical Exam: Filed Vitals:   02/26/15 1608  BP: 127/72  Pulse: 80  Temp: 98.4 F (36.9 C)  TempSrc: Oral  Height: 6' 4"  (1.93 m)  Weight: 205 lb 6.4 oz (93.169 kg)  SpO2: 100%   Physical Exam  Constitutional: He appears well-developed and  well-nourished.  HENT:  Head: Normocephalic and atraumatic.  Eyes: EOM are normal.  Neck: Normal range of motion. Neck supple.  Cardiovascular: Normal rate, regular rhythm, normal heart sounds and intact distal pulses.   No murmur heard. Pulmonary/Chest: Effort normal and breath sounds normal. No respiratory distress. He has no wheezes.  Abdominal: Soft. Bowel sounds are normal. He exhibits no distension. There is no tenderness.    Assessment & Plan:   Please see problem based charting for assessment and plan Please see problem list/A&P for the status of the patient's chronic medical problems.

## 2015-02-26 NOTE — Assessment & Plan Note (Signed)
Pt not having Chest pain since he last visited and has not taken his nitro in a month, and only has CP when he exerts him self /  Currently on ASA, coreg, and statin.  Pt requesting viagra and I told him that we are unable to prescribe him because of his existing stable angina and he would need to be seen by cardiology  I again went over with him the risks of stable angina, and  not being seen by cardiology sooner to be put on appropriate treatment for his stable angina, including the rik of death, and last time, I had gone over the same issue in great detail. Pt agreed to go see cardiology to obtain viagra  Plan- refer to cardiology and continue current medicines

## 2015-02-26 NOTE — Progress Notes (Signed)
Medicine attending: I personally interviewed and briefly examined this patient, and reviewed pertinent clinical laboratory  data  with resident physician Dr.Parth Saraiya and we discussed a   management plan. 

## 2015-02-26 NOTE — Patient Instructions (Signed)
Thanks for your visit today Please follow up with Cardiology for your heart, and ED Please follow up here in 3-6 months

## 2015-03-23 ENCOUNTER — Other Ambulatory Visit: Payer: Self-pay | Admitting: Internal Medicine

## 2015-03-26 ENCOUNTER — Other Ambulatory Visit: Payer: Self-pay | Admitting: Internal Medicine

## 2015-04-02 ENCOUNTER — Telehealth (HOSPITAL_COMMUNITY): Payer: Self-pay | Admitting: *Deleted

## 2015-04-02 ENCOUNTER — Encounter: Payer: Self-pay | Admitting: Cardiovascular Disease

## 2015-04-02 ENCOUNTER — Ambulatory Visit (INDEPENDENT_AMBULATORY_CARE_PROVIDER_SITE_OTHER): Payer: Medicare Other | Admitting: Cardiovascular Disease

## 2015-04-02 VITALS — BP 120/80 | HR 69 | Ht 76.0 in | Wt 204.0 lb

## 2015-04-02 DIAGNOSIS — I2510 Atherosclerotic heart disease of native coronary artery without angina pectoris: Secondary | ICD-10-CM

## 2015-04-02 MED ORDER — SILDENAFIL CITRATE 20 MG PO TABS: 100.0000 mg | ORAL_TABLET | Freq: Every day | ORAL | Status: DC | PRN

## 2015-04-02 NOTE — Telephone Encounter (Signed)
Patient given detailed instructions per Myocardial Perfusion Study Information Sheet for the test on 04/06/15 at 745. Patient notified to arrive 15 minutes early and that it is imperative to arrive on time for appointment to keep from having the test rescheduled.  If you need to cancel or reschedule your appointment, please call the office within 24 hours of your appointment. Failure to do so may result in a cancellation of your appointment, and a $50 no show fee. Patient verbalized understanding.[name] CharMary J Joette Schmoker, RN

## 2015-04-02 NOTE — Patient Instructions (Addendum)
Medication Instructions:  Take Sildenafil 20 mg 5 tabs as needed   Labwork: None Ordered   Testing/Procedures: Your physician has requested that you have en exercise stress myoview. For further information please visit [email or web address]. Please follow instruction sheet, as given.    Follow-Up: Your physician wants you to follow-up in: 1 year with Dr. Elease HashimotoNahser.  You will receive a reminder letter in the mail two months in advance. If you don't receive a letter, please call our office to schedule the follow-up appointment.   If you need a refill on your cardiac medications before your next appointment, please call your pharmacy.   Thank you for choosing [facility]! Eligha BridegroomMichelle Opaline Reyburn, RN [phone]

## 2015-04-02 NOTE — Progress Notes (Signed)
[name] Newton Date of Birth  12-01-1960 [facility] / [facility] [address]     [place], [place]  [address] [phone]  Fax  [phone]   Problems: 1. Coronary artery disease-PTCA and stenting of his first diagonal artery-May, 2012 2. Hyperlipidemia 3. Leg pain 4. Diabetes mellitus  History of Present Illness:  54 year old gentleman with a history of coronary artery disease. Status post PTCA and stenting of his first diagonal artery in May, 2012.   He's done well. He has not had any episodes of chest pain.    He has not gone back to work. Complains of some numbness and tingling in his right leg. The tingling seems to start in his right hip  and goes down his right leg.  He complains of some right leg pain with walking.  Oct. 8, 2013 -  He is doing well.  He has not returned to work.  He has had problems with his legs - had some infections of his legs due to diabetes.   He is not having any chest pain.    04/02/2015: Gerald Stabs is seen today after a three-year absence. No angina cp. Occasional episodes of CP with mental stress  Exercises regularly  -   Current Outpatient Prescriptions on File Prior to Visit  Medication Sig Dispense Refill  . albuterol (PROAIR HFA) 108 (90 BASE) MCG/ACT inhaler Inhale 2 puffs into the lungs every 6 (six) hours as needed for wheezing or shortness of breath. 2 Inhaler 6  . aspirin 81 MG tablet Take 1 tablet (81 mg total) by mouth daily as needed. 90 tablet 4  . Blood Glucose Monitoring Suppl (ACCU-CHEK AVIVA PLUS) W/DEVICE KIT Use as directed 1 kit 2  . carvedilol (COREG) 3.125 MG tablet Take 1 tablet by mouth two  times daily with meals 120 tablet 2  . Fluticasone-Salmeterol (ADVAIR DISKUS) 100-50 MCG/DOSE AEPB Inhale 1 puff into the lungs every 12 (twelve) hours. 14 each 12  . glucose blood test strip Use to check blood sugar up to 1 times a day before  Meals Dx code- 250.00 100 each 12  .  metFORMIN (GLUCOPHAGE) 1000 MG tablet Take 1 tablet (1,000 mg total) by mouth 2 (two) times daily with a meal. 120 tablet 6  . NITROSTAT 0.4 MG SL tablet Dissolve 1 tablet under the tongue every 5 minutes as  needed for chest pain 25 tablet 1  . pregabalin (LYRICA) 50 MG capsule Take 1 capsule (50 mg total) by mouth 3 (three) times daily. 90 capsule 2  . tiotropium (SPIRIVA HANDIHALER) 18 MCG inhalation capsule Place 1 capsule (18 mcg total) into inhaler and inhale daily. 30 capsule 3  . lovastatin (MEVACOR) 40 MG tablet Take 1 tablet (40 mg total) by mouth at bedtime. (Patient not taking: Reported on 04/02/2015) 60 tablet 6   No current facility-administered medications on file prior to visit.    Allergies  Allergen Reactions  . Codeine Nausea And Vomiting    Past Medical History  Diagnosis Date  . Diabetes mellitus   . Tobacco abuse   . Hyperlipidemia   . Hypertension   . NSTEMI (non-ST elevated myocardial infarction) [facility]) April 2012    status post successful PCI and bare-metal  stenting of the first diagonal this admission.   . Guaiac positive stools      Guaiac positive stool with stable H and H.     Past Surgical History  Procedure Laterality Date  . Cardiac  catheterization      Normal EF. 90% diagonal lesion  . Umbilical hernia repair    . Coronary stent placement  April 2012    Diagonal lesion. Bare metal    History  Smoking status  . Current Some Day Smoker -- 0.50 packs/day  . Types: Cigarettes  Smokeless tobacco  . Not on file    Comment: Patches do not work.  Cutting back.  Wants Chantix    History  Alcohol Use No    Family History  Problem Relation Age of Onset  . Heart attack Father   . Coronary artery disease Brother   . Heart attack Brother     Reviw of Systems:  Reviewed in the HPI.  All other systems are negative.  Physical Exam: BP 120/80 mmHg  Pulse 69  Ht 6' 4"  (1.93 m)  Wt 204 lb (92.534 kg)  BMI 24.84 kg/m2  SpO2 98% The patient  is alert and oriented x 3.  The mood and affect are normal.   Skin: warm and dry.  Color is normal.    HEENT:   the sclera are nonicteric.  The mucous membranes are moist.  The carotids are 2+ without bruits.  There is no thyromegaly.  There is no JVD.    Lungs: clear.  The chest wall is non tender.    Heart: regular rate with a normal S1 and S2.  There are no murmurs, gallops, or rubs. The PMI is not displaced.     Abdomen: good bowel sounds.  There is no guarding or rebound.  There is no hepatosplenomegaly or tenderness.  There are no masses.   Extremities:  no clubbing, cyanosis, or edema.  He has several scars on his lower legs from skin infections.  The distal pulses are intact.   Neuro:  Cranial nerves II - XII are intact.  Motor and sensory functions are intact.    The gait is normal.  EKG:  Assessment / Plan:   1. Coronary artery disease-PTCA and stenting of his first diagonal artery-May, 2012   He's been having some atypical episodes of chest discomfort. He does take nitroglycerin on occasion.  His primary reason for coming today is because he is having some ED issues.  He will also try Viagra. I told him that he could not take nitroglycerin for the several days after taking Viagra tablet. Because he's been having some episodes of chest discomfort, I think that we should do a stress Myoview study for further evaluation prior to giving the OK to take Viagra. Will go ahead and give him a prescription for generic Silidenifil 20 mg tabs . He will get subsequent prescriptions from his primary medical doctor.  2. Hyperlipidemia - followed by his primary medical doctori  3. Leg pain 4. Diabetes mellitus    Angelli Baruch, Wonda Cheng, MD  04/02/2015 9:43 AM    [facility] [place],  [place] [address], [place]  [address] Pager [phone] Phone: [phone]; Fax: [phone]   [facility]  [address] [place], [place]   [address] [phone]   Fax [phone]

## 2015-04-06 ENCOUNTER — Ambulatory Visit (HOSPITAL_COMMUNITY): Payer: Medicare Other | Attending: Cardiology

## 2015-04-06 DIAGNOSIS — R0609 Other forms of dyspnea: Secondary | ICD-10-CM | POA: Insufficient documentation

## 2015-04-06 DIAGNOSIS — R079 Chest pain, unspecified: Secondary | ICD-10-CM | POA: Diagnosis not present

## 2015-04-06 DIAGNOSIS — I2510 Atherosclerotic heart disease of native coronary artery without angina pectoris: Secondary | ICD-10-CM | POA: Insufficient documentation

## 2015-04-06 DIAGNOSIS — F17200 Nicotine dependence, unspecified, uncomplicated: Secondary | ICD-10-CM | POA: Diagnosis not present

## 2015-04-06 DIAGNOSIS — Z8249 Family history of ischemic heart disease and other diseases of the circulatory system: Secondary | ICD-10-CM | POA: Diagnosis not present

## 2015-04-06 DIAGNOSIS — R42 Dizziness and giddiness: Secondary | ICD-10-CM | POA: Diagnosis not present

## 2015-04-06 DIAGNOSIS — E119 Type 2 diabetes mellitus without complications: Secondary | ICD-10-CM | POA: Diagnosis not present

## 2015-04-06 DIAGNOSIS — R0602 Shortness of breath: Secondary | ICD-10-CM | POA: Insufficient documentation

## 2015-04-06 LAB — MYOCARDIAL PERFUSION IMAGING
Estimated workload: 7 METS
Exercise duration (min): 6 min
Exercise duration (sec): 0 s
LV dias vol: 75 mL
LV sys vol: 29 mL
MPHR: 165 {beats}/min
Peak HR: 148 {beats}/min
Percent HR: 89 %
RATE: 0.34
Rest HR: 66 {beats}/min
SDS: 0
SRS: 2
SSS: 2
TID: 0.94

## 2015-04-06 MED ORDER — TECHNETIUM TC 99M SESTAMIBI GENERIC - CARDIOLITE
11.0000 | Freq: Once | INTRAVENOUS | Status: AC | PRN
  Administered 2015-04-06: 11 via INTRAVENOUS

## 2015-04-06 MED ORDER — TECHNETIUM TC 99M SESTAMIBI GENERIC - CARDIOLITE
32.4000 | Freq: Once | INTRAVENOUS | Status: AC | PRN
Start: 2015-04-06 — End: 2015-04-06
  Administered 2015-04-06: 32 via INTRAVENOUS

## 2015-04-13 ENCOUNTER — Other Ambulatory Visit: Payer: Self-pay | Admitting: Internal Medicine

## 2015-06-02 ENCOUNTER — Telehealth: Payer: Self-pay | Admitting: Cardiovascular Disease

## 2015-06-02 MED ORDER — SILDENAFIL CITRATE 100 MG PO TABS: 100.0000 mg | ORAL_TABLET | Freq: Every day | ORAL | Status: DC | PRN

## 2015-06-02 NOTE — Telephone Encounter (Signed)
Spoke with patient who states Dr. Elease HashimotoNahser prescribed sildenafil for ED and medication did not work.  He requests Rx for Viagra #5 with refills - states he is aware of the cost of the medication. I advised that Rx will be sent as soon as it is signed by Dr. Elease HashimotoNahser.  He verbalized understanding and agreement.

## 2015-06-02 NOTE — Telephone Encounter (Signed)
New Message  Pt requesting to speak w/ Rn- would not specify nature of call. Please call back and discuss.

## 2015-07-22 ENCOUNTER — Telehealth: Payer: Self-pay | Admitting: Internal Medicine

## 2015-07-22 NOTE — Telephone Encounter (Signed)
APPT. REMINDER CALL, LMTCB IF HE NEEDS TO CANCEL °

## 2015-07-23 ENCOUNTER — Telehealth: Payer: Self-pay | Admitting: Dietician

## 2015-07-23 ENCOUNTER — Encounter: Payer: Self-pay | Admitting: Internal Medicine

## 2015-07-23 ENCOUNTER — Ambulatory Visit (INDEPENDENT_AMBULATORY_CARE_PROVIDER_SITE_OTHER): Payer: Medicare Other | Admitting: Internal Medicine

## 2015-07-23 VITALS — BP 110/65 | Temp 98.1°F | Ht 76.0 in | Wt 194.5 lb

## 2015-07-23 DIAGNOSIS — E1165 Type 2 diabetes mellitus with hyperglycemia: Secondary | ICD-10-CM | POA: Diagnosis not present

## 2015-07-23 DIAGNOSIS — E781 Pure hyperglyceridemia: Secondary | ICD-10-CM

## 2015-07-23 DIAGNOSIS — Z7984 Long term (current) use of oral hypoglycemic drugs: Secondary | ICD-10-CM | POA: Diagnosis not present

## 2015-07-23 DIAGNOSIS — I10 Essential (primary) hypertension: Secondary | ICD-10-CM

## 2015-07-23 DIAGNOSIS — Z79899 Other long term (current) drug therapy: Secondary | ICD-10-CM

## 2015-07-23 DIAGNOSIS — IMO0002 Reserved for concepts with insufficient information to code with codable children: Secondary | ICD-10-CM

## 2015-07-23 DIAGNOSIS — J984 Other disorders of lung: Secondary | ICD-10-CM

## 2015-07-23 DIAGNOSIS — J449 Chronic obstructive pulmonary disease, unspecified: Secondary | ICD-10-CM

## 2015-07-23 DIAGNOSIS — E1142 Type 2 diabetes mellitus with diabetic polyneuropathy: Secondary | ICD-10-CM | POA: Diagnosis not present

## 2015-07-23 DIAGNOSIS — E1121 Type 2 diabetes mellitus with diabetic nephropathy: Secondary | ICD-10-CM

## 2015-07-23 DIAGNOSIS — Z0000 Encounter for general adult medical examination without abnormal findings: Secondary | ICD-10-CM

## 2015-07-23 LAB — GLUCOSE, CAPILLARY: Glucose-Capillary: 210 mg/dL — ABNORMAL HIGH (ref 65–99)

## 2015-07-23 LAB — POCT GLYCOSYLATED HEMOGLOBIN (HGB A1C): Hemoglobin A1C: 9.5

## 2015-07-23 MED ORDER — ALBUTEROL SULFATE HFA 108 (90 BASE) MCG/ACT IN AERS: 2.0000 | INHALATION_SPRAY | Freq: Four times a day (QID) | RESPIRATORY_TRACT | Status: DC | PRN

## 2015-07-23 MED ORDER — GLIPIZIDE 5 MG PO TABS
5.0000 mg | ORAL_TABLET | Freq: Every day | ORAL | Status: DC
Start: 2015-07-23 — End: 2015-07-31

## 2015-07-23 MED ORDER — GLUCOSE BLOOD VI STRP: ORAL_STRIP | Status: DC

## 2015-07-23 MED ORDER — METFORMIN HCL 1000 MG PO TABS: 1000.0000 mg | ORAL_TABLET | Freq: Two times a day (BID) | ORAL | Status: DC

## 2015-07-23 MED ORDER — FLUTICASONE-SALMETEROL 100-50 MCG/DOSE IN AEPB: 1.0000 | INHALATION_SPRAY | Freq: Two times a day (BID) | RESPIRATORY_TRACT | Status: DC

## 2015-07-23 MED ORDER — GEMFIBROZIL 600 MG PO TABS: 600.0000 mg | ORAL_TABLET | Freq: Two times a day (BID) | ORAL | Status: DC

## 2015-07-23 MED ORDER — ACCU-CHEK AVIVA PLUS W/DEVICE KIT: PACK | Status: DC

## 2015-07-23 MED ORDER — PREGABALIN 50 MG PO CAPS: 50.0000 mg | ORAL_CAPSULE | Freq: Three times a day (TID) | ORAL | Status: DC

## 2015-07-23 NOTE — Patient Instructions (Addendum)
-  Start taking metformin 1000 mg twice a day and glipizide 5 mg daily for your diabetes -Start checking your blood sugar 2-3 times a day and bring your meter next time -Start taking gemfibrozil 600 mg twice a day for high triglycerides -I refilled your lyrica -I refilled your advair to take twice a day and albuterol only as needed -Please get a diabetic eye exam -Will check your feet and bloodwork next time and give you the stool cards -Very nice meeting you, please come to see Dr. Johnny Bridge  General Instructions:   Please bring your medicines with you each time you come to clinic.  Medicines may include prescription medications, over-the-counter medications, herbal remedies, eye drops, vitamins, or other pills.   Progress Toward Treatment Goals:  Treatment Goal 09/24/2014  Hemoglobin A1C unchanged  Stop smoking -  Prevent falls -    Self Care Goals & Plans:  Self Care Goal 09/24/2014  Manage my medications take my medicines as prescribed; bring my medications to every visit; refill my medications on time  Monitor my health -  Eat healthy foods drink diet soda or water instead of juice or soda; eat more vegetables; eat foods that are low in salt; eat baked foods instead of fried foods; eat fruit for snacks and desserts  Be physically active -    Home Blood Glucose Monitoring 09/24/2014  Check my blood sugar 2 times a day  When to check my blood sugar before breakfast; before lunch; before dinner     Care Management & Community Referrals:  Referral 03/05/2014  Referrals made for care management support none needed

## 2015-07-23 NOTE — Progress Notes (Signed)
Lyrica 50 mg rx faxed to [facility].

## 2015-07-23 NOTE — Progress Notes (Signed)
Patient ID: [name] Newton, male   DOB: [birth year], 55 y.o.   MRN: [id number]    Subjective:   Patient ID: [name] Newton male   DOB: Oct 06, 1960 55 y.o.   MRN: [id number]  HPI: [name] is a 55 y.o. pleasant man with past medical history of non-insulin dependent Type 2 DM, peripheral neuropathy, hypertension, hypertriglyceridemia, CAD s/p BMS (2012), fatty liver, COPD, tobacco use, ED, and GERD who presents for follow-up of diabetes and medication refill.   His last A1c was 8.1 on 12/15/14. He reports being out of metformin 1000 mg BID and glipizide 2.5 mg daily for the past 2-3 months due to inability to afford medications. He has not been checking his blood sugars at home. He has chronic peripheral neuropathy controlled on lyrica but denies symptomatic hypoglycemia, blurry vision, polydipsia, polyuria, polyphagia, or foot injury/ucler. He tries to follow a healthy diet and exercise. He has lost 11 lb since last visit five months ago. He has not had a recent eye exam.  He is not taking carvedilol for hypertension. He has occasional headache but denies chest pain, LE edema, or lightheadedness.    He has been out of lovastatin for the past 2-3 months for hyperlipidemia. His triglyceride level on 12/15/14 was 685. He denies history of pancreatitis and occasionally drinks alcohol.   He has COPD and was on advair daily but ran out of it as well. He denies recent exacerbation. He is trying to cut back on smoking and currently smoking less than 1 ppd.   He declines flu shot. He declines screening colonoscopy but would like to have home stool card testing at next visit.    Past Medical History  Diagnosis Date  . Diabetes mellitus   . Tobacco abuse   . Hyperlipidemia   . Hypertension   . NSTEMI (non-ST elevated myocardial infarction) [facility]) April 2012    status post successful PCI and bare-metal  stenting of the first diagonal this admission.   . Guaiac positive  stools      Guaiac positive stool with stable H and H.    Current Outpatient Prescriptions  Medication Sig Dispense Refill  . albuterol (PROAIR HFA) 108 (90 BASE) MCG/ACT inhaler Inhale 2 puffs into the lungs every 6 (six) hours as needed for wheezing or shortness of breath. 2 Inhaler 6  . aspirin 81 MG tablet Take 1 tablet (81 mg total) by mouth daily as needed. 90 tablet 4  . Blood Glucose Monitoring Suppl (ACCU-CHEK AVIVA PLUS) W/DEVICE KIT Use as directed 1 kit 2  . carvedilol (COREG) 3.125 MG tablet Take 1 tablet by mouth two  times daily with meals 120 tablet 2  . Fluticasone-Salmeterol (ADVAIR DISKUS) 100-50 MCG/DOSE AEPB Inhale 1 puff into the lungs every 12 (twelve) hours. 14 each 12  . glucose blood test strip Use to check blood sugar up to 1 times a day before  Meals Dx code- 250.00 100 each 12  . lovastatin (MEVACOR) 40 MG tablet Take 1 tablet (40 mg total) by mouth at bedtime. (Patient not taking: Reported on 04/02/2015) 60 tablet 6  . metFORMIN (GLUCOPHAGE) 1000 MG tablet Take 1 tablet (1,000 mg total) by mouth 2 (two) times daily with a meal. 120 tablet 6  . nitroGLYCERIN (NITROSTAT) 0.4 MG SL tablet Dissolve 1 tablet under the tongue every 5 minutes as  needed for chest pain 50 tablet 1  . pregabalin (LYRICA) 50 MG capsule Take 1 capsule (50 mg total) by mouth 3 (  three) times daily. 90 capsule 2  . sildenafil (VIAGRA) 100 MG tablet Take 1 tablet (100 mg total) by mouth daily as needed for erectile dysfunction. 5 tablet 5   No current facility-administered medications for this visit.   Family History  Problem Relation Age of Onset  . Heart attack Father   . Coronary artery disease Brother   . Heart attack Brother    Social History   Social History  . Marital Status: Divorced    Spouse Name: N/A  . Number of Children: N/A  . Years of Education: 10   Occupational History  . none     Unemployed since 2012   Social History Main Topics  . Smoking status: Current Some  Day Smoker -- 0.50 packs/day    Types: Cigarettes  . Smokeless tobacco: None     Comment: Patches do not work.  Cutting back.  Wants Chantix  . Alcohol Use: No  . Drug Use: No  . Sexual Activity: Not Asked   Other Topics Concern  . None   Social History Narrative   Patient is intermittently homeless without any income at the moment.          Review of Systems: Review of Systems  Constitutional: Positive for weight loss. Negative for fever and chills.  Eyes: Negative for blurred vision.  Respiratory: Negative for cough, shortness of breath and wheezing.   Cardiovascular: Negative for chest pain and leg swelling.  Gastrointestinal: Negative for nausea, vomiting, diarrhea, constipation and blood in stool.  Genitourinary: Negative for dysuria, urgency, frequency and hematuria.  Musculoskeletal: Negative for myalgias.  Neurological: Positive for sensory change (chronic peripheral neuropathy ) and headaches. Negative for dizziness.  Endo/Heme/Allergies: Negative for polydipsia.  Psychiatric/Behavioral: Positive for substance abuse (tobacco ).    Objective:  Physical Exam: Filed Vitals:   07/23/15 1534  BP: 110/65  Temp: 98.1 F (36.7 C)  Height: 6' 4"  (1.93 m)  Weight: 194 lb 8 oz (88.225 kg)  SpO2: 99%    Physical Exam  Constitutional: He is oriented to person, place, and time. He appears well-developed and well-nourished. No distress.  HENT:  Head: Normocephalic and atraumatic.  Right Ear: External ear normal.  Left Ear: External ear normal.  Nose: Nose normal.  Mouth/Throat: Oropharynx is clear and moist. No oropharyngeal exudate.  Eyes: Conjunctivae and EOM are normal. Pupils are equal, round, and reactive to light. Right eye exhibits no discharge. Left eye exhibits no discharge. No scleral icterus.  Neck: Normal range of motion. Neck supple.  Cardiovascular: Normal rate, regular rhythm and normal heart sounds.   Pulmonary/Chest: Effort normal and breath sounds  normal. No respiratory distress. He has no wheezes. He has no rales.  Abdominal: Soft. Bowel sounds are normal. He exhibits no distension. There is no tenderness. There is no rebound and no guarding.  Musculoskeletal: Normal range of motion. He exhibits no edema or tenderness.  Neurological: He is alert and oriented to person, place, and time.  Skin: Skin is warm and dry. No rash noted. He is not diaphoretic. No erythema. No pallor.  Psychiatric: He has a normal mood and affect. His behavior is normal. Judgment and thought content normal.    Assessment & Plan:   Please see problem list for problem-based assessment and plan

## 2015-07-26 ENCOUNTER — Encounter: Payer: Self-pay | Admitting: Internal Medicine

## 2015-07-26 DIAGNOSIS — I10 Essential (primary) hypertension: Secondary | ICD-10-CM

## 2015-07-26 DIAGNOSIS — N529 Male erectile dysfunction, unspecified: Secondary | ICD-10-CM

## 2015-07-26 HISTORY — DX: Male erectile dysfunction, unspecified: N52.9

## 2015-07-26 HISTORY — DX: Essential (primary) hypertension: I10

## 2015-07-26 NOTE — Assessment & Plan Note (Signed)
Assessment: Pt with last A1c of 8.1 on 12/15/14 noncompliant with dual oral hypoglycemic therapy for past 2-3 months due to cost with no recent episodes of hypoglycemia who presents with CBG of 210 and worsened A1c of 9.5.  Plan:  -A1c 9.5 not at goal <7, refill metformin 1000 mg BID and increase glipizide from 2.5 mg daily to 5 mg daily. Also ordered glucose meter and supplies.  -BP 110/65 at goal <140/90, not on anti-hypertensive therapy  -LDL 152 not at goal <100, prescribe gemfibrozil 600 mg BID for TG level of 685 -Last annual foot exam 10/09/13, declined today, perform at next visit  -Last annual eye exam on 03/05/14 with no retinopathy, pt instructed to obtain at outside facility  -Last annual urine microalbumin test on 12/15/14 with no proteinuria   -Refill lyrica 50 mg TID for chronic peripheral neuropathy  -BMI 23.69 at goal <25, continue lifestyle modification -Continue aspirin 81 mg daily for primary CVD prevention

## 2015-07-26 NOTE — Assessment & Plan Note (Addendum)
-  Pt declined influenza vaccination  -Pt declines screening colonoscopy, would like home stool card testing at next visit -Obtain CMP and CBC at next visit, declined today

## 2015-07-26 NOTE — Assessment & Plan Note (Addendum)
Assessment: Pt with last lipid panel on 12/15/14 with LDL 152 and TG 685 noncompliant with statin therapy with recommendations to start fibrate therapy due to TG level >500 with risk of pancreatitis.   Plan:  -Prescribe gemfibrozil 600 mg BID due to TG level >500 with risk of pancreatitis  -Repeat annual lipid panel in 3 months to assess for response -Discontinue lovastatin 40 mg daily (pt not taking) due to increased risk of myositis with fibrate use  -Monitor for myalgias and myositis  -Obtain CMP at next visit to assess liver function, declined today

## 2015-07-26 NOTE — Assessment & Plan Note (Addendum)
Assessment: Pt is a smoker with presumed moderate COPD with no PFT's noncompliant with maintenance inhaler use due to cost who presents with no exacerbation.    Plan:  -Refill advair 10-5 mcg inhaler 1 puff BID  -Refill albuterol 2 puffs Q 6 hr PRN acute  -Pt counseled on tobacco cessation  -Order PFT's at next visit

## 2015-07-26 NOTE — Assessment & Plan Note (Signed)
Assessment: Pt with well-controlled hypertension non-compliant with one-class (BB) anti-hypertensive therapy who presents with blood pressure of 110/65.   Plan:  -BP 110/65 at goal <140/90 -Pt no longer taking carvedilol 3.125 mg BID, will discontinue as BP is normal -Continue to monitor off anti-hypertensive therapy

## 2015-07-27 NOTE — Progress Notes (Signed)
Medicine attending: Medical history, presenting problems, physical findings, and medications, reviewed with resident physician Dr Marjan Rabbani on the day of the patient visit and I concur with her evaluation and management plan. 

## 2015-07-30 NOTE — Telephone Encounter (Signed)
Left message for return call. Calling to follow up on diabetes self care.

## 2015-07-31 ENCOUNTER — Other Ambulatory Visit: Payer: Self-pay | Admitting: Pharmacist

## 2015-07-31 DIAGNOSIS — IMO0002 Reserved for concepts with insufficient information to code with codable children: Secondary | ICD-10-CM

## 2015-07-31 DIAGNOSIS — E1165 Type 2 diabetes mellitus with hyperglycemia: Principal | ICD-10-CM

## 2015-07-31 DIAGNOSIS — E1121 Type 2 diabetes mellitus with diabetic nephropathy: Secondary | ICD-10-CM

## 2015-07-31 MED ORDER — METFORMIN HCL 1000 MG PO TABS: 1000.0000 mg | ORAL_TABLET | Freq: Two times a day (BID) | ORAL | Status: DC

## 2015-07-31 MED ORDER — GLIPIZIDE 5 MG PO TABS: 5.0000 mg | ORAL_TABLET | Freq: Every day | ORAL | Status: DC

## 2015-07-31 NOTE — Telephone Encounter (Addendum)
Says he cannot get his medicine because his account is negative until about the 13th of March.  Needs meter and medicine. Told him I would speak to our pharmacist and social worker and call him on Monday.

## 2015-08-05 NOTE — Telephone Encounter (Signed)
Was thinking either the fund [facility] or we can pitch to pay. It's because he has Medicare---the 340B program is for charity care patients. Just let me know, I don't mind pitching in.

## 2015-08-05 NOTE — Telephone Encounter (Signed)
Has not been able to get his medicine yet, he is still working on it.  His call was transferred to the front office per his request to schedule an appointment

## 2015-08-11 ENCOUNTER — Telehealth: Payer: Self-pay | Admitting: Licensed Clinical Social Worker

## 2015-08-11 NOTE — Telephone Encounter (Signed)
CSW placed call to [name] to follow up on telephone note routed to CSW on 08/05/15.  Pt confirms that he currently has all of his medications and meter.  [name] states that during the month of December and January he purchased items online which withdrew from his account.  The cost exceeded pt's budget and pt could not afford his medications.  [name] states this is not a monthly issue and he generally can afford his medications.  Pt is already linked with the [employer] program.  CSW will sign off as pt states he currently has his medications and is aware he mismanaged his finances over the past several months.

## 2015-08-11 NOTE — Telephone Encounter (Signed)
CSW placed call to [name].  Pt currently has all medications.  Pt stated his mismanaged money during December/January which caused the difficulty with affording medications.  Pt is now caught up.

## 2015-08-27 ENCOUNTER — Telehealth: Payer: Self-pay | Admitting: Internal Medicine

## 2015-08-27 NOTE — Telephone Encounter (Signed)
APPT. REMINDER CALL, LMTCB °

## 2015-08-31 ENCOUNTER — Encounter: Payer: Self-pay | Admitting: Internal Medicine

## 2015-08-31 ENCOUNTER — Ambulatory Visit (INDEPENDENT_AMBULATORY_CARE_PROVIDER_SITE_OTHER): Payer: Medicare Other | Admitting: Internal Medicine

## 2015-08-31 VITALS — BP 118/73 | HR 64 | Temp 97.7°F | Ht 76.0 in | Wt 197.7 lb

## 2015-08-31 DIAGNOSIS — E1165 Type 2 diabetes mellitus with hyperglycemia: Secondary | ICD-10-CM

## 2015-08-31 DIAGNOSIS — E118 Type 2 diabetes mellitus with unspecified complications: Secondary | ICD-10-CM | POA: Diagnosis not present

## 2015-08-31 DIAGNOSIS — Z0000 Encounter for general adult medical examination without abnormal findings: Secondary | ICD-10-CM

## 2015-08-31 DIAGNOSIS — E1140 Type 2 diabetes mellitus with diabetic neuropathy, unspecified: Secondary | ICD-10-CM

## 2015-08-31 DIAGNOSIS — I10 Essential (primary) hypertension: Secondary | ICD-10-CM

## 2015-08-31 DIAGNOSIS — F17210 Nicotine dependence, cigarettes, uncomplicated: Secondary | ICD-10-CM | POA: Diagnosis not present

## 2015-08-31 DIAGNOSIS — IMO0002 Reserved for concepts with insufficient information to code with codable children: Secondary | ICD-10-CM

## 2015-08-31 DIAGNOSIS — Z7984 Long term (current) use of oral hypoglycemic drugs: Secondary | ICD-10-CM

## 2015-08-31 DIAGNOSIS — Z720 Tobacco use: Secondary | ICD-10-CM

## 2015-08-31 LAB — GLUCOSE, CAPILLARY: Glucose-Capillary: 129 mg/dL — ABNORMAL HIGH (ref 65–99)

## 2015-08-31 NOTE — Assessment & Plan Note (Addendum)
-  hemoccult cards given today -would also qualify for low dose CT scan for lung cancer screening given 40+yr history (should discuss at next OV)

## 2015-08-31 NOTE — Assessment & Plan Note (Signed)
-  discussed smoking cessation, reports smoking about 1/2 ppd for 40+ yrs, states he quit at one time, but does not seem interested in quitting today (states he has patches at home)

## 2015-08-31 NOTE — Patient Instructions (Addendum)
Thank you for your visit today.   Please return to the internal medicine clinic in 2 month(s) or sooner if needed.   Please remember to take your glipizide every day as well as all your other medications.  Also, please try to cut back on smoking.  We will be able to help you if needed.   We will give you stool cards today to check for blood in your stool.  We will also check labs today. Will check your eyes the next time you return.   We will also check labs today.     Please be sure to bring all of your medications with you to every visit; this includes herbal supplements, vitamins, eye drops, and any over-the-counter medications.   Should you have any questions regarding your medications and/or any new or worsening symptoms, please be sure to call the clinic at [phone].   If you believe that you are suffering from a life threatening condition or one that may result in the loss of limb or function, then you should call 911 and proceed to the nearest Emergency Department.   Diabetes Mellitus and Food It is important for you to manage your blood sugar (glucose) level. Your blood glucose level can be greatly affected by what you eat. Eating healthier foods in the appropriate amounts throughout the day at about the same time each day will help you control your blood glucose level. It can also help slow or prevent worsening of your diabetes mellitus. Healthy eating may even help you improve the level of your blood pressure and reach or maintain a healthy weight.  General recommendations for healthful eating and cooking habits include:  Eating meals and snacks regularly. Avoid going long periods of time without eating to lose weight.  Eating a diet that consists mainly of plant-based foods, such as fruits, vegetables, nuts, legumes, and whole grains.  Using low-heat cooking methods, such as baking, instead of high-heat cooking methods, such as deep frying. Work with your dietitian to make  sure you understand how to use the Nutrition Facts information on food labels. HOW CAN FOOD AFFECT ME? Carbohydrates Carbohydrates affect your blood glucose level more than any other type of food. Your dietitian will help you determine how many carbohydrates to eat at each meal and teach you how to count carbohydrates. Counting carbohydrates is important to keep your blood glucose at a healthy level, especially if you are using insulin or taking certain medicines for diabetes mellitus. Alcohol Alcohol can cause sudden decreases in blood glucose (hypoglycemia), especially if you use insulin or take certain medicines for diabetes mellitus. Hypoglycemia can be a life-threatening condition. Symptoms of hypoglycemia (sleepiness, dizziness, and disorientation) are similar to symptoms of having too much alcohol.  If your health care provider has given you approval to drink alcohol, do so in moderation and use the following guidelines:  Women should not have more than one drink per day, and men should not have more than two drinks per day. One drink is equal to:  12 oz of beer.  5 oz of wine.  1 oz of hard liquor.  Do not drink on an empty stomach.  Keep yourself hydrated. Have water, diet soda, or unsweetened iced tea.  Regular soda, juice, and other mixers might contain a lot of carbohydrates and should be counted. WHAT FOODS ARE NOT RECOMMENDED? As you make food choices, it is important to remember that all foods are not the same. Some foods have fewer nutrients per  serving than other foods, even though they might have the same number of calories or carbohydrates. It is difficult to get your body what it needs when you eat foods with fewer nutrients. Examples of foods that you should avoid that are high in calories and carbohydrates but low in nutrients include:  Trans fats (most processed foods list trans fats on the Nutrition Facts label).  Regular soda.  Juice.  Candy.  Sweets, such as  cake, pie, doughnuts, and cookies.  Fried foods. WHAT FOODS CAN I EAT? Eat nutrient-rich foods, which will nourish your body and keep you healthy. The food you should eat also will depend on several factors, including:  The calories you need.  The medicines you take.  Your weight.  Your blood glucose level.  Your blood pressure level.  Your cholesterol level. You should eat a variety of foods, including:  Protein.  Lean cuts of meat.  Proteins low in saturated fats, such as fish, egg whites, and beans. Avoid processed meats.  Fruits and vegetables.  Fruits and vegetables that may help control blood glucose levels, such as apples, mangoes, and yams.  Dairy products.  Choose fat-free or low-fat dairy products, such as milk, yogurt, and cheese.  Grains, bread, pasta, and rice.  Choose whole grain products, such as multigrain bread, whole oats, and brown rice. These foods may help control blood pressure.  Fats.  Foods containing healthful fats, such as nuts, avocado, olive oil, canola oil, and fish. DOES EVERYONE WITH DIABETES MELLITUS HAVE THE SAME MEAL PLAN? Because every person with diabetes mellitus is different, there is not one meal plan that works for everyone. It is very important that you meet with a dietitian who will help you create a meal plan that is just right for you.   This information is not intended to replace advice given to you by your health care provider. Make sure you discuss any questions you have with your health care provider.   Document Released: 02/10/2005 Document Revised: 06/06/2014 Document Reviewed: 04/12/2013 Elsevier Interactive Patient Education [employer]

## 2015-08-31 NOTE — Assessment & Plan Note (Addendum)
Pt presents for f/u.  Diagnosed with DM about 4 yrs ago.  No retinopathy or nephropathy.  Does endorse neuropathy in the feet.  Reports compliance with metformin and glipizide.  Has been checking blood sugars about 3 times daily.  Denies any episodes of hypoglycemia.  He drinks some sweetened drinks occasionally but mainly diet.  Diet for breakfast: eggs, lunch: peanut butter crackers, dinner: sweet potato, salisbury steak, green beans.  Drinks milk. Reports exercising.  He has been taking metformin bid but misses doses of glipizide frequently.  States he blood sugars have been running in the 120-140s fasting and have gotten in the 200s.  Denies polyuria or polyphagia.  Reports polydipsia.  Meter was downloaded but only 7 checks with highest 164, and average of 140.   -cont current meds -f/u in 2 month -retinal scan -CMP today  -foot exam today

## 2015-08-31 NOTE — Assessment & Plan Note (Signed)
BP well controlled.

## 2015-08-31 NOTE — Progress Notes (Signed)
Patient ID: [name] Newton, male   DOB: 1961-03-15, 55 y.o.   MRN: [id number]     Subjective:   Patient ID: [name] Newton male    DOB: 12-Jul-1960 55 y.o.    MRN: [id number] Health Maintenance Due: Health Maintenance Due  Topic Date Due  . FOOT EXAM  10/10/2014  . OPHTHALMOLOGY EXAM  03/06/2015  . COLON CANCER SCREENING ANNUAL FOBT  03/20/2015    _________________________________________________  HPI: [name] is a 55 y.o. male here for a routine follow up visit for diabetes.      Pt has a PMH outlined below.  Please see problem-based charting assessment and plan for further status of patient's chronic medical problems addressed at today's visit.  PMH: Past Medical History  Diagnosis Date  . Diabetes mellitus   . Tobacco abuse   . Hyperlipidemia   . Hypertension   . NSTEMI (non-ST elevated myocardial infarction) [facility]) April 2012    status post successful PCI and bare-metal  stenting of the first diagonal this admission.   . Guaiac positive stools      Guaiac positive stool with stable H and H.     Medications: Current Outpatient Prescriptions on File Prior to Visit  Medication Sig Dispense Refill  . albuterol (PROAIR HFA) 108 (90 Base) MCG/ACT inhaler Inhale 2 puffs into the lungs every 6 (six) hours as needed for wheezing or shortness of breath. 1 Inhaler 2  . aspirin 81 MG tablet Take 1 tablet (81 mg total) by mouth daily as needed. 90 tablet 4  . Blood Glucose Monitoring Suppl (ACCU-CHEK AVIVA PLUS) w/Device KIT Use as directed 1 kit 2  . Fluticasone-Salmeterol (ADVAIR DISKUS) 100-50 MCG/DOSE AEPB Inhale 1 puff into the lungs every 12 (twelve) hours. 14 each 2  . gemfibrozil (LOPID) 600 MG tablet Take 1 tablet (600 mg total) by mouth 2 (two) times daily. 180 tablet 1  . glipiZIDE (GLUCOTROL) 5 MG tablet Take 1 tablet (5 mg total) by mouth daily before breakfast. [employer] [id number], last 4 SS 5920 30 tablet 0  . glucose blood test  strip Use to check blood sugar up to 1 times a day before  Meals Dx code- 250.00 100 each 12  . metFORMIN (GLUCOPHAGE) 1000 MG tablet Take 1 tablet (1,000 mg total) by mouth 2 (two) times daily with a meal. [employer] [id number], last 4 SS 5920 60 tablet 0  . nitroGLYCERIN (NITROSTAT) 0.4 MG SL tablet Dissolve 1 tablet under the tongue every 5 minutes as  needed for chest pain 50 tablet 1  . pregabalin (LYRICA) 50 MG capsule Take 1 capsule (50 mg total) by mouth 3 (three) times daily. 270 capsule 3  . sildenafil (VIAGRA) 100 MG tablet Take 1 tablet (100 mg total) by mouth daily as needed for erectile dysfunction. 5 tablet 5   No current facility-administered medications on file prior to visit.    Allergies: Allergies  Allergen Reactions  . Codeine Nausea And Vomiting    FH: Family History  Problem Relation Age of Onset  . Heart attack Father   . Coronary artery disease Brother   . Heart attack Brother     SH: Social History   Social History  . Marital Status: Divorced    Spouse Name: N/A  . Number of Children: N/A  . Years of Education: 10   Occupational History  . none     Unemployed since 2012   Social History Main Topics  . Smoking status: Current  Some Day Smoker -- 0.50 packs/day    Types: Cigarettes  . Smokeless tobacco: Not on file     Comment: Patches do not work.  Cutting back.  Wants Chantix  . Alcohol Use: No  . Drug Use: No  . Sexual Activity: Not on file   Other Topics Concern  . Not on file   Social History Narrative   Patient is intermittently homeless without any income at the moment.           Review of Systems: Constitutional: Negative for fever, chills and weight loss.  Eyes: +blurred vision.  Respiratory: Negative for shortness of breath.  Cardiovascular: Negative for chest pain, palpitations and leg swelling.  Gastrointestinal: Negative for nausea, vomiting, abdominal pain, diarrhea, constipation and blood in stool.  Genitourinary:  Negative for dysuria, urgency and frequency.  Neurological: Negative for dizziness, weakness and headaches.     Objective:   Vital Signs: Filed Vitals:   08/31/15 0958  BP: 118/73  Pulse: 64  Temp: 97.7 F (36.5 C)  TempSrc: Oral  Height: _0  (1.93 m)  Weight: 197 lb 11.2 oz (89.676 kg)  SpO2: 100%      BP Readings from Last 3 Encounters:  08/31/15 118/73  07/23/15 110/65  04/02/15 120/80    Physical Exam: Constitutional: Vital signs reviewed.  Patient is in NAD and cooperative with exam.  Head: Normocephalic and atraumatic. Eyes: EOMI, conjunctivae nl, no scleral icterus.  Neck: Supple. Cardiovascular: RRR, no MRG. Pulmonary/Chest: normal effort, CTAB, no wheezes, rales, or rhonchi. Abdominal: Soft. NT/ND +BS. Neurological: A&O x3, cranial nerves II-XII are grossly intact, moving all extremities. Extremities: 2+DP b/l; no pitting edema. Skin: Warm, dry and intact. No rash.   Assessment & Plan:   Assessment and plan was discussed and formulated with my attending.

## 2015-09-01 LAB — CMP14 + ANION GAP
ALT: 14 IU/L (ref 0–44)
AST: 21 IU/L (ref 0–40)
Albumin/Globulin Ratio: 1.5 (ref 1.2–2.2)
Albumin: 4.6 g/dL (ref 3.5–5.5)
Alkaline Phosphatase: 51 IU/L (ref 39–117)
Anion Gap: 19 mmol/L — ABNORMAL HIGH (ref 10.0–18.0)
BUN/Creatinine Ratio: 17 (ref 9–20)
BUN: 15 mg/dL (ref 6–24)
Bilirubin Total: 0.3 mg/dL (ref 0.0–1.2)
CO2: 23 mmol/L (ref 18–29)
Calcium: 10.2 mg/dL (ref 8.7–10.2)
Chloride: 97 mmol/L (ref 96–106)
Creatinine, Ser: 0.87 mg/dL (ref 0.76–1.27)
GFR calc Af Amer: 112 mL/min/{1.73_m2} (ref >59)
GFR calc non Af Amer: 97 mL/min/{1.73_m2} (ref >59)
Globulin, Total: 3.1 g/dL (ref 1.5–4.5)
Glucose: 130 mg/dL — ABNORMAL HIGH (ref 65–99)
Potassium: 5 mmol/L (ref 3.5–5.2)
Sodium: 139 mmol/L (ref 134–144)
Total Protein: 7.7 g/dL (ref 6.0–8.5)

## 2015-09-01 NOTE — Progress Notes (Signed)
Internal Medicine Clinic Attending  Case discussed with Dr. Delane GingerGill soon after the resident saw the patient.  We reviewed the resident's history and exam and pertinent patient test results.  I agree with the assessment, diagnosis, and plan of care documented in the resident's note.  One correction: The patient does not qualify for lung cancer screening. Only has 20 pack year history of smoking, would need > 30 pack year history to qualify for low dose CT chest screening.

## 2015-09-02 ENCOUNTER — Telehealth: Payer: Self-pay | Admitting: Pharmacist

## 2015-09-02 DIAGNOSIS — E781 Pure hyperglyceridemia: Secondary | ICD-10-CM

## 2015-09-02 DIAGNOSIS — E118 Type 2 diabetes mellitus with unspecified complications: Principal | ICD-10-CM

## 2015-09-02 DIAGNOSIS — E1165 Type 2 diabetes mellitus with hyperglycemia: Secondary | ICD-10-CM

## 2015-09-02 DIAGNOSIS — IMO0002 Reserved for concepts with insufficient information to code with codable children: Secondary | ICD-10-CM

## 2015-09-02 MED ORDER — GLIPIZIDE-METFORMIN HCL 2.5-500 MG PO TABS: 2.0000 | ORAL_TABLET | Freq: Two times a day (BID) | ORAL | Status: DC

## 2015-09-02 MED ORDER — ATORVASTATIN CALCIUM 40 MG PO TABS: 40.0000 mg | ORAL_TABLET | Freq: Every day | ORAL | Status: DC

## 2015-09-02 MED ORDER — FENOFIBRATE 48 MG PO TABS
48.0000 mg | ORAL_TABLET | Freq: Every day | ORAL | Status: DC
Start: 2015-09-02 — End: 2015-11-23

## 2015-09-02 MED FILL — FENOFIBRATE 48 MG TABLET: 48 | 30 days supply | Qty: 30 | Fill #0

## 2015-09-02 MED FILL — ATORVASTATIN 40 MG TABLET: 40 | 30 days supply | Qty: 30 | Fill #0

## 2015-09-02 MED FILL — GLIPIZIDE-METFORMIN 2.5-500: 2.5-500 | 30 days supply | Qty: 120 | Fill #0

## 2015-09-02 NOTE — Addendum Note (Signed)
Addended by: Mliss FritzKIM, JENNIFER J on: 09/02/2015 11:48 AM   Modules accepted: Orders, Medications

## 2015-09-02 NOTE — Telephone Encounter (Signed)
Collaborated with Dr. Delane GingerGill to initiate statin therapy and switch patient from gemfibrozil to fenofibrate. Also combined glipizide-metformin. Prescriptions sent to [facility] and notified pharmacy to d/c gemfibrozil, prior Rx for glipizide and metformin. Contacted patient to notify him of changes. Patient verbalized understanding and requested appointment for medication review/education. Appointment scheduled for 09/07/15.

## 2015-09-07 ENCOUNTER — Ambulatory Visit: Payer: Medicare Other | Admitting: Pharmacist

## 2015-09-07 DIAGNOSIS — Z79899 Other long term (current) drug therapy: Secondary | ICD-10-CM

## 2015-09-10 ENCOUNTER — Other Ambulatory Visit: Payer: Self-pay | Admitting: Internal Medicine

## 2015-09-14 ENCOUNTER — Telehealth: Payer: Self-pay | Admitting: Pharmacist

## 2015-09-14 NOTE — Telephone Encounter (Signed)
Last OV 4/3

## 2015-09-14 NOTE — Progress Notes (Signed)
Medication(s) were reviewed with the patient, including name, instructions, indication, goals of therapy, potential side effects, importance of adherence, and safe use.  Patient verbalized understanding by repeating back information and was advised to contact me if further medication-related questions arise. Patient was also provided an information handout.  Patient was seen in clinic with Karna DupesMedina Rasul, PharmD candidate. I agree with the assessment and plan of care documented.

## 2015-09-14 NOTE — Telephone Encounter (Signed)
Calling to follow up after starting atorvastatin 09/02/15. Patient reports tolerability and states he has no concerns at this time. Initial supply [facility], [facility]

## 2015-11-17 ENCOUNTER — Other Ambulatory Visit: Payer: Self-pay | Admitting: Internal Medicine

## 2015-11-23 ENCOUNTER — Ambulatory Visit (INDEPENDENT_AMBULATORY_CARE_PROVIDER_SITE_OTHER): Payer: Medicare Other | Admitting: Internal Medicine

## 2015-11-23 ENCOUNTER — Encounter: Payer: Self-pay | Admitting: Internal Medicine

## 2015-11-23 VITALS — BP 99/64 | HR 79 | Temp 98.3°F | Ht 76.0 in | Wt 198.1 lb

## 2015-11-23 DIAGNOSIS — I2510 Atherosclerotic heart disease of native coronary artery without angina pectoris: Secondary | ICD-10-CM | POA: Diagnosis not present

## 2015-11-23 DIAGNOSIS — E1140 Type 2 diabetes mellitus with diabetic neuropathy, unspecified: Secondary | ICD-10-CM

## 2015-11-23 DIAGNOSIS — Z7984 Long term (current) use of oral hypoglycemic drugs: Secondary | ICD-10-CM

## 2015-11-23 DIAGNOSIS — E1165 Type 2 diabetes mellitus with hyperglycemia: Secondary | ICD-10-CM

## 2015-11-23 DIAGNOSIS — E785 Hyperlipidemia, unspecified: Secondary | ICD-10-CM

## 2015-11-23 DIAGNOSIS — Z955 Presence of coronary angioplasty implant and graft: Secondary | ICD-10-CM | POA: Diagnosis not present

## 2015-11-23 DIAGNOSIS — F17210 Nicotine dependence, cigarettes, uncomplicated: Secondary | ICD-10-CM

## 2015-11-23 DIAGNOSIS — E118 Type 2 diabetes mellitus with unspecified complications: Principal | ICD-10-CM

## 2015-11-23 DIAGNOSIS — Z720 Tobacco use: Secondary | ICD-10-CM

## 2015-11-23 DIAGNOSIS — E781 Pure hyperglyceridemia: Secondary | ICD-10-CM

## 2015-11-23 DIAGNOSIS — IMO0002 Reserved for concepts with insufficient information to code with codable children: Secondary | ICD-10-CM

## 2015-11-23 LAB — POCT GLYCOSYLATED HEMOGLOBIN (HGB A1C): Hemoglobin A1C: 6.7

## 2015-11-23 LAB — GLUCOSE, CAPILLARY: Glucose-Capillary: 157 mg/dL — ABNORMAL HIGH (ref 65–99)

## 2015-11-23 MED ORDER — GLIPIZIDE 5 MG PO TABS: 5.0000 mg | ORAL_TABLET | Freq: Every day | ORAL | Status: DC

## 2015-11-23 MED ORDER — ATORVASTATIN CALCIUM 40 MG PO TABS: 40.0000 mg | ORAL_TABLET | Freq: Every day | ORAL | Status: DC

## 2015-11-23 MED ORDER — METFORMIN HCL 1000 MG PO TABS: 1000.0000 mg | ORAL_TABLET | Freq: Two times a day (BID) | ORAL | Status: DC

## 2015-11-23 MED ORDER — FENOFIBRATE 48 MG PO TABS: 48.0000 mg | ORAL_TABLET | Freq: Every day | ORAL | Status: DC

## 2015-11-23 NOTE — Assessment & Plan Note (Addendum)
Patient has had a bare metal stent placed in April 2012.  He was put on Coreg but he has since stopped taking them, as per the patient when he saw Dr. Johna Rolesabbani per note.  It is unclear as to why he has stopped taking it where it was tolerability, or other reasons. His blood pressure today was 99/64, and HR of 79 and so he has been normotensive without any antihypertensives.    Will clarify at the next visit, and discuss risk and benefits of re-initiating beta blocker, as it has been shown to have a mortality benefit in CAD (non-printzmetal)

## 2015-11-23 NOTE — Progress Notes (Signed)
Patient ID: [name] Newton, male   DOB: Dec 20, 1960, 55 y.o.   MRN: [id number]     Subjective:   Patient ID: [name] Newton male   DOB: August 07, 1960 55 y.o.   MRN: [id number]  HPI: [name] is a 55 y.o. man with PMH noted below who is here for a routine follow up to discuss his diabetes, smoking, HLD.   Please see Problem List/A&P for the status of the patient's chronic medical problems      Past Medical History  Diagnosis Date  . Diabetes mellitus   . Tobacco abuse   . Hyperlipidemia   . Hypertension   . NSTEMI (non-ST elevated myocardial infarction) [facility]) April 2012    status post successful PCI and bare-metal  stenting of the first diagonal this admission.   . Guaiac positive stools      Guaiac positive stool with stable H and H.    Current Outpatient Prescriptions  Medication Sig Dispense Refill  . ADVAIR DISKUS 100-50 MCG/DOSE AEPB Use 1 inhalation every 12  hours 180 each 3  . albuterol (PROAIR HFA) 108 (90 Base) MCG/ACT inhaler Inhale 2 puffs into the lungs every 6 (six) hours as needed for wheezing or shortness of breath. 1 Inhaler 2  . aspirin 81 MG tablet Take 1 tablet (81 mg total) by mouth daily as needed. 90 tablet 4  . atorvastatin (LIPITOR) 40 MG tablet Take 1 tablet (40 mg total) by mouth daily. 90 tablet 1  . Blood Glucose Monitoring Suppl (ACCU-CHEK AVIVA PLUS) w/Device KIT Use as directed 1 kit 2  . glucose blood test strip Use to check blood sugar up to 1 times a day before  Meals Dx code- 250.00 100 each 12  . nitroGLYCERIN (NITROSTAT) 0.4 MG SL tablet Dissolve 1 tablet under the tongue every 5 minutes as  needed for chest pain 50 tablet 0  . pregabalin (LYRICA) 50 MG capsule Take 1 capsule (50 mg total) by mouth 3 (three) times daily. 270 capsule 3  . fenofibrate (TRICOR) 48 MG tablet Take 1 tablet (48 mg total) by mouth daily. (Patient not taking: Reported on 11/23/2015) 30 tablet 0  . glipiZIDE-metformin (METAGLIP) 2.5-500 MG  tablet Take 2 tablets by mouth 2 (two) times daily before a meal. PRICE CHECK (Patient not taking: Reported on 11/23/2015) 120 tablet 0  . sildenafil (VIAGRA) 100 MG tablet Take 1 tablet (100 mg total) by mouth daily as needed for erectile dysfunction. (Patient not taking: Reported on 11/23/2015) 5 tablet 5   No current facility-administered medications for this visit.   Family History  Problem Relation Age of Onset  . Heart attack Father   . Coronary artery disease Brother   . Heart attack Brother    Social History   Social History  . Marital Status: Divorced    Spouse Name: N/A  . Number of Children: N/A  . Years of Education: 10   Occupational History  . none     Unemployed since 2012   Social History Main Topics  . Smoking status: Current Some Day Smoker -- 0.50 packs/day    Types: Cigarettes  . Smokeless tobacco: None     Comment: Patches do not work.  Cutting back.  Wants Chantix  . Alcohol Use: No  . Drug Use: No  . Sexual Activity: Not Asked   Other Topics Concern  . None   Social History Narrative   Patient is intermittently homeless without any income at the moment.  Review of Systems: A comprehensive ROS is obtained and is noted in either in HPI or in problem list, and remainder of comprehensive ROS otherwise negative   Constitutional: Negative for fever, chills, weight loss and malaise/fatigue.   Respiratory: Negative for cough, shortness of breath and wheezing.  Cardiovascular: Negative for chest pain, orthopnea, or PND   Gastrointestinal: Negative for  nausea, vomiting,      Objective:  Physical Exam: Filed Vitals:   11/23/15 1528  BP: 99/64  Pulse: 79  Temp: 98.3 F (36.8 C)  TempSrc: Oral  Height: 6' 4"  (1.93 m)  Weight: 198 lb 1.6 oz (89.858 kg)  SpO2: 100%    General: A&O, in NAD CV: RRR, normal s1, s2, no m/r/g Resp: equal and symmetric breath sounds, no wheezing or crackles  Abdomen: soft, nontender, nondistended, +BS in  all 4 quadrants Neuro: DTRs 2+ and symmetric   Assessment & Plan:   Please see problem based charting for assessment and plan

## 2015-11-23 NOTE — Assessment & Plan Note (Signed)
Patient's A1c today was 6.7, which is at goal. He takes metformin 1000 BID and glipizide 5 mg daily (not BID at originally prescribed).  He has ran out of the glipizide and ran out of the strips. He checks his CBGs infrequently but says lowest has been 106. He denies any symptomatic lows.  He endorses neuropathy which he takes lyrica for.   A: T2DM, with neuropathy  Plan Continue metformin 1000 mg BID and glipizide 5 mg daily RTC in 3 months -referred for opthalmology

## 2015-11-23 NOTE — Assessment & Plan Note (Signed)
Patient told me that he has smoked for >35 years. He used to smoke 1 ppd, but now has been smoking 1/2 ppd. It is unclear as to the timeline from when he switched from 1 ppd to 1/2 ppd as that differentiates him for being qualified for the Low dose CT scan. As one has to have >30 pack year smoking history in order to be qualified.  -Will clarify at next visit

## 2015-11-23 NOTE — Patient Instructions (Signed)
Please follow up in 3 months Please continue your atorvastatin, and Tricor has been refilled Please take metformin and glipizide Please check your blood sugars

## 2015-11-23 NOTE — Assessment & Plan Note (Addendum)
Last lipid panel on 12/15/14 with LDL 152 and TG 685. Per past notes, he has been noncompliant with statin therapy with recommendations to start fibrate therapy due to TG level >500 with risk of pancreatitis.   Patient told me that he was taking the atorvastatin daily, but not the Tricor .   Lipitor was given on 4/5 with 90 tablets with 1 refill. And Tricor was given on same day with 30 tablets.   I called the pharmacy at 6:40 PM ([facility]) and said they refilled on  april 28 for 3 months and has 1 refill  Plan Continue atorvastatin Repeat lipid profile at next visit  Continue to emphasize the tricor

## 2015-11-24 NOTE — Progress Notes (Signed)
Internal Medicine Clinic Attending  Case discussed with Dr. Saraiya at the time of the visit.  We reviewed the resident's history and exam and pertinent patient test results.  I agree with the assessment, diagnosis, and plan of care documented in the resident's note.  

## 2015-12-01 ENCOUNTER — Other Ambulatory Visit: Payer: Self-pay | Admitting: Internal Medicine

## 2016-01-26 ENCOUNTER — Other Ambulatory Visit: Payer: Self-pay | Admitting: *Deleted

## 2016-01-26 DIAGNOSIS — E1165 Type 2 diabetes mellitus with hyperglycemia: Principal | ICD-10-CM

## 2016-01-26 DIAGNOSIS — IMO0002 Reserved for concepts with insufficient information to code with codable children: Secondary | ICD-10-CM

## 2016-01-26 DIAGNOSIS — E1121 Type 2 diabetes mellitus with diabetic nephropathy: Secondary | ICD-10-CM

## 2016-01-26 MED ORDER — PREGABALIN 50 MG PO CAPS: 50.0000 mg | ORAL_CAPSULE | Freq: Three times a day (TID) | ORAL | 3 refills | Status: DC

## 2016-01-26 NOTE — Telephone Encounter (Signed)
Please phone in. Thanks!

## 2016-01-27 NOTE — Telephone Encounter (Signed)
Rx phoned into pharmacy.Criss AlvineGoldston, Jyl Chico Cassady8/30/201710:02 AM

## 2016-02-15 ENCOUNTER — Ambulatory Visit (INDEPENDENT_AMBULATORY_CARE_PROVIDER_SITE_OTHER): Payer: Medicare Other | Admitting: Internal Medicine

## 2016-02-15 ENCOUNTER — Other Ambulatory Visit: Payer: Self-pay | Admitting: Internal Medicine

## 2016-02-15 ENCOUNTER — Encounter: Payer: Self-pay | Admitting: Internal Medicine

## 2016-02-15 VITALS — BP 106/62 | HR 79 | Temp 98.0°F | Ht 76.0 in | Wt 210.7 lb

## 2016-02-15 DIAGNOSIS — R21 Rash and other nonspecific skin eruption: Secondary | ICD-10-CM

## 2016-02-15 DIAGNOSIS — L989 Disorder of the skin and subcutaneous tissue, unspecified: Secondary | ICD-10-CM

## 2016-02-15 DIAGNOSIS — Z7982 Long term (current) use of aspirin: Secondary | ICD-10-CM

## 2016-02-15 DIAGNOSIS — Z7984 Long term (current) use of oral hypoglycemic drugs: Secondary | ICD-10-CM | POA: Diagnosis not present

## 2016-02-15 DIAGNOSIS — I25118 Atherosclerotic heart disease of native coronary artery with other forms of angina pectoris: Secondary | ICD-10-CM | POA: Diagnosis not present

## 2016-02-15 DIAGNOSIS — E1140 Type 2 diabetes mellitus with diabetic neuropathy, unspecified: Secondary | ICD-10-CM

## 2016-02-15 DIAGNOSIS — IMO0002 Reserved for concepts with insufficient information to code with codable children: Secondary | ICD-10-CM

## 2016-02-15 DIAGNOSIS — E1165 Type 2 diabetes mellitus with hyperglycemia: Principal | ICD-10-CM

## 2016-02-15 DIAGNOSIS — I2510 Atherosclerotic heart disease of native coronary artery without angina pectoris: Secondary | ICD-10-CM

## 2016-02-15 DIAGNOSIS — E1121 Type 2 diabetes mellitus with diabetic nephropathy: Secondary | ICD-10-CM

## 2016-02-15 DIAGNOSIS — I2089 Other forms of angina pectoris: Secondary | ICD-10-CM

## 2016-02-15 DIAGNOSIS — I208 Other forms of angina pectoris: Secondary | ICD-10-CM | POA: Insufficient documentation

## 2016-02-15 HISTORY — DX: Other forms of angina pectoris: I20.89

## 2016-02-15 HISTORY — DX: Other forms of angina pectoris: I20.8

## 2016-02-15 HISTORY — DX: Rash and other nonspecific skin eruption: R21

## 2016-02-15 LAB — GLUCOSE, CAPILLARY: Glucose-Capillary: 146 mg/dL — ABNORMAL HIGH (ref 65–99)

## 2016-02-15 LAB — POCT GLYCOSYLATED HEMOGLOBIN (HGB A1C): Hemoglobin A1C: 6.8

## 2016-02-15 MED ORDER — ATORVASTATIN CALCIUM 40 MG PO TABS: 40.0000 mg | ORAL_TABLET | Freq: Every day | ORAL | 1 refills | Status: DC

## 2016-02-15 MED ORDER — METOPROLOL TARTRATE 25 MG PO TABS: 12.5000 mg | ORAL_TABLET | Freq: Two times a day (BID) | ORAL | 1 refills | Status: DC

## 2016-02-15 MED ORDER — GLUCOSE BLOOD VI STRP: ORAL_STRIP | 12 refills | Status: DC

## 2016-02-15 NOTE — Progress Notes (Signed)
   CC: T2DM, HLD, rash, CAD, HM HPI: [name] is a 55 y.o. man with PMH noted below here for T2DM, HLD, Rash, CAD, HM   Please see Problem List/A&P for the status of the patient's chronic medical problems   Past Medical History:  Diagnosis Date  . Diabetes mellitus   . Guaiac positive stools     Guaiac positive stool with stable H and H.   . Hyperlipidemia   . Hypertension   . NSTEMI (non-ST elevated myocardial infarction) [facility]) April 2012   status post successful PCI and bare-metal  stenting of the first diagonal this admission.   . Tobacco abuse     Review of Systems:  Constitutional: Negative for fever, chills, fatigure HEENT: No headaches,  Respiratory: Negative for cough, shortness of breath and wheezing.  Cardiovascular: Has chest pain 2-3 times a week relieved by nitro. Currently chest pain- free  Gastrointestinal: Negative for nausea, vomiting  Skin: has rash pruritic   Physical Exam: Vitals:   02/15/16 1423  BP: 106/62  Pulse: 79  Temp: 98 F (36.7 C)  TempSrc: Oral  SpO2: 97%  Weight: 210 lb 11.2 oz (95.6 kg)  Height: 6\' 4"  (1.93 m)    General: A&O, in NAD Neck: supple, midline trachea,  CV: RRR, normal s1, s2, no m/r/g,  Resp: equal and symmetric breath sounds, no wheezing or crackles  Abdomen: soft, nontender, nondistended, +BS Skin: vesicular rash on right antecubital fossa, left elbow and back of the head  Extremities: right ankle  has a closed scar which pt thinks is an ulcer     Assessment & Plan:   See encounters tab for problem based medical decision making. Patient discussed with Dr. Criselda PeachesMullen

## 2016-02-15 NOTE — Patient Instructions (Addendum)
Please start taking metoprolol 12.5 mg twice a day  Please follow up with the heart doctor [facility]: [phone]  Please follow up here at your convenience to discuss about the foot pain   Please go to your EYE DOCTOR appointment on Monday September 25th at 9 AM. Show them your insurance card as we discussed.    As always, if your chest pain worsens, please go to the ER if it is not relieved by the nitroglycerin medicine

## 2016-02-15 NOTE — Assessment & Plan Note (Signed)
Lab Results  Component Value Date   HGBA1C 6.8 02/15/2016   HGBA1C 6.7 11/23/2015   HGBA1C 9.5 07/23/2015    Patient's A1c is 6.8, which is at goal, and he is compliant with his metformin and glipizide daily. He still continues to check his CBGs daily and brought his meter. He is not on insulin.  Assessment: T2DM at goal   Plan -continue metformin and glipizide -explained that he does not need to check his CBGs daily, but only as needed -He has eye appt this coming Monday -last foot exam was April 2017 -on aspirin -prescribed atorvastatin (re-prescribed)  -follow up in 3 months

## 2016-02-15 NOTE — Assessment & Plan Note (Signed)
Pt has had a bare metal stent placed in 2012. He was on coreg but not currently on any beta blocker.  His blood pressure today was 106/62 and HR 79, not currently on any antihypertensives .  Pt has not followed up with a cardiologist recently.   As beta blocker has a mortality benefit for CAD, and it will also help with his stable angina symptoms, we will add one today.   Plan -Added metoprolol 12.5 mg BID  -follow up with cardiology

## 2016-02-15 NOTE — Assessment & Plan Note (Signed)
Patient mentions that he is having chest pain 2-3 times a week which he describes as sharp pain that sometimes radiates to the arm. The pain would go away in a few hours, or immediately after he takes a nitroglycerin tablet. He could be sitting, or doing normal work when this happens. This has been an ongoing issue and was discussed in April 2016 when he was recommended a hospital admission for evaluation which pt had declined. He says the pain is similar to all of his episodes and has not worsened in intensity. He denies any associated dyspnea or dizziness. We reviewed his prior EKG and last one was in April 2016 which showed some Q waves in leads 2 and 3  Pt was currently without any chest pain.  We explained the need to repeat an EKG and patient declined and said he will make appt with cardiology.   Assessment: chronic stable angina  Plan -added metoprolol 12.5 mg BID daily -follow up with cardiology -advised if chest pain recurs or worsens then go to the ER

## 2016-02-15 NOTE — Assessment & Plan Note (Signed)
Patient mentioned that he had one lesion on his right antecubital fossa 3 months ago and after that it spread to his arm, and then left elbow, and now below the head . He denies any bedbugs or scabies. No one else in the home has similar rash. No new detergents or lotions. He says that the rash formed a blister which would scrape off, leaving with a fresh wound that would intermittently drain clear fluid. On exam, eroded vesicles on different stages of healing on right and left arm, and one new pinpoint lesion in right axillae.   Differentials include pemphigus vulgaris, bullous pemphigoid, and porphyria cutanea tarda, or other autoimmune etiology   Plan -We obtained 2 punch biopsy, one outside the lesion, and other perilesional in saline and formalin and both were sent to the lab for H&E -Pt will follow up in a month   Punch Biopsy Procedure Note- for 2 punch biopsies   Pre-operative Diagnosis: Differentials include pemphigus vulgaris, bullous pemphigoid, and porphyria cutanea tarda, or other autoimmune etiology    Location: on right arm  Anesthesia: Lidocaine 1% without epinephrine   Procedure Details  History of allergy to lidocaine: no Sensitivity to epinephrine: no  Patient informed of the risks (including bleeding and infection) and benefits of the  procedure and verbal informed consent obtained.  The lesion and surrounding area was prepped with a povidone iodine swab. The skin was then stretched perpendicular to the skin tension lines and the lesion removed using the  . Pressure was put for few minutes and a sterile dressing applied. The specimen was sent for pathologic examination. The patient tolerated the procedure well.  Condition: Stable  Complications: bleeding. Patient was on 4 aspirins a day and we explained that the clotting time will be prolonged. The wound had stopped bleeding before patient left.   Plan: 1. Instructed to keep the wound dry and covered for  24-48 hours and clean thereafter. 2. Warning signs of infection were reviewed.

## 2016-02-22 ENCOUNTER — Telehealth: Payer: Self-pay | Admitting: Internal Medicine

## 2016-02-22 DIAGNOSIS — H35372 Puckering of macula, left eye: Secondary | ICD-10-CM | POA: Diagnosis not present

## 2016-02-22 DIAGNOSIS — H2513 Age-related nuclear cataract, bilateral: Secondary | ICD-10-CM | POA: Diagnosis not present

## 2016-02-22 DIAGNOSIS — H35033 Hypertensive retinopathy, bilateral: Secondary | ICD-10-CM | POA: Diagnosis not present

## 2016-02-22 DIAGNOSIS — H25013 Cortical age-related cataract, bilateral: Secondary | ICD-10-CM | POA: Diagnosis not present

## 2016-02-22 DIAGNOSIS — E119 Type 2 diabetes mellitus without complications: Secondary | ICD-10-CM | POA: Diagnosis not present

## 2016-02-22 LAB — HM DIABETES EYE EXAM

## 2016-02-22 NOTE — Telephone Encounter (Signed)
Dr Johnny BridgeSaraiya to return 27th or 28th. I reviewed results - safe to wait until PCP returns.

## 2016-02-22 NOTE — Telephone Encounter (Signed)
calling for results of infection on arm.

## 2016-02-23 NOTE — Progress Notes (Signed)
Internal Medicine Clinic Attending  I saw and evaluated the patient.  I personally confirmed the key portions of the history and exam documented by Dr. Saraiya and I reviewed pertinent patient test results.  The assessment, diagnosis, and plan were formulated together and I agree with the documentation in the resident's note. I was present for the entire procedure.  

## 2016-02-24 MED ORDER — HYDROCORTISONE 1 % EX CREA: TOPICAL_CREAM | CUTANEOUS | 0 refills | Status: DC

## 2016-02-24 NOTE — Telephone Encounter (Signed)
I called the patient and explained the results of the skin biopsy which was negative for autoimmune conditions. Biopsy showed possibly contact dermatitis.   I explained to the patient to use hydrocortisone cream for up to 2 weeks on affected areas. Not to apply on open or draining wounds. I asked him to change his detergents to unscented, and soaps also. Also to change lotion to unscented lotion.   I asked him to follow up in 3-4 weeks, and at that time if his condition does not improve then we will need to refer to allergist.

## 2016-02-29 ENCOUNTER — Encounter: Payer: Self-pay | Admitting: *Deleted

## 2016-02-29 ENCOUNTER — Telehealth: Payer: Self-pay | Admitting: Internal Medicine

## 2016-02-29 NOTE — Telephone Encounter (Signed)
Needs to talk to nurse °

## 2016-02-29 NOTE — Telephone Encounter (Signed)
Pt calls and is angry and rude, he states "yall didn't diagnose me right" I have shingles and yall said it wasn't, my sister and 2 more people that work in hospitals have told me its shingles and they know, you all are lying to me and I have a 582 month old baby I been feeding and I googled it and that's dangerous and my sister said she knows and you all need to get in trouble"  He was offered an appt and tried to explain that unless a physician diagnoses shingles then it could be something that looks a lot like shingles but is not, he then stated "my sister is taking those classes right now she is going to be one" I then ask what he would like and he stated the truth like google and someone like my sister that works in the hospital and takes classes to be one. I suggested that he either be seen in clinic today, choose another physician or go to the ED, he hung up at that point

## 2016-02-29 NOTE — Telephone Encounter (Signed)
Noted. Reviewed path report. Dr Johnny BridgeSaraiya has called pt

## 2016-03-11 ENCOUNTER — Encounter: Payer: Self-pay | Admitting: *Deleted

## 2016-03-11 NOTE — Telephone Encounter (Signed)
This encounter was created in error - please disregard.

## 2016-03-15 ENCOUNTER — Other Ambulatory Visit: Payer: Self-pay | Admitting: Internal Medicine

## 2016-03-21 ENCOUNTER — Telehealth: Payer: Self-pay | Admitting: *Deleted

## 2016-03-21 ENCOUNTER — Ambulatory Visit (INDEPENDENT_AMBULATORY_CARE_PROVIDER_SITE_OTHER): Payer: 59 | Admitting: Family Medicine

## 2016-03-21 VITALS — BP 124/82 | HR 77 | Temp 97.8°F | Resp 18 | Ht 76.0 in | Wt 208.0 lb

## 2016-03-21 DIAGNOSIS — R21 Rash and other nonspecific skin eruption: Secondary | ICD-10-CM | POA: Diagnosis not present

## 2016-03-21 DIAGNOSIS — M542 Cervicalgia: Secondary | ICD-10-CM | POA: Diagnosis not present

## 2016-03-21 MED ORDER — CYCLOBENZAPRINE HCL 5 MG PO TABS: ORAL_TABLET | ORAL | 0 refills | Status: DC

## 2016-03-21 NOTE — Telephone Encounter (Signed)
cherylynn of [employer] calls and request info from pt's labs for a diab program he has enrolled in with [employer], she states they have called twice and faxed twice in recent months and were given the #[phone]. I have now given her triage fax # and will forward

## 2016-03-21 NOTE — Patient Instructions (Addendum)
For your rash, you can try Zyrtec over-the-counter 1 per day to help with itching, Aveeno lotion, and if needed Sarna lotion over-the-counter. Wash over those areas with soap and water at least once per day with a mild soap such as Dove as certain soaps can cause irritation and itching of the skin. Based on your last biopsy results with your primary care provider, the rash may be due to a type of psoriasis. This is often treated with steroid creams, but since you did not tolerate the previous steroid cream, I will refer you to dermatology to discuss other treatments.  For your neck pain, try Tylenol over-the-counter up to 4 times per day, heat and gentle range of motion, and if needed, I prescribed a muscle relaxant (flexeril). Be careful taking this medication as it does cause sedation and risk of falling. Again, follow-up in 2 weeks and at that time if not improved, can check an x-ray or possibly refer you to an orthopedist.  For questions regarding your other medicines and diabetes medicines, would recommend you contact your primary care provider. If any other acute concerns, please return to see us.  Return to the clinic or go to the nearest emergency room if any of your symptoms worsen or new symptoms occur.   Rash A rash is a change in the color or texture of the skin. There are many different types of rashes. You may have other problems that accompany your rash. CAUSES   Infections.  Allergic reactions. This can include allergies to pets or foods.  Certain medicines.  Exposure to certain chemicals, soaps, or cosmetics.  Heat.  Exposure to poisonous plants.  Tumors, both cancerous and noncancerous. SYMPTOMS   Redness.  Scaly skin.  Itchy skin.  Dry or cracked skin.  Bumps.  Blisters.  Pain. DIAGNOSIS  Your caregiver may do a physical exam to determine what type of rash you have. A skin sample (biopsy) may be taken and examined under a microscope. TREATMENT  Treatment  depends on the type of rash you have. Your caregiver may prescribe certain medicines. For serious conditions, you may need to see a skin doctor (dermatologist). HOME CARE INSTRUCTIONS   Avoid the substance that caused your rash.  Do not scratch your rash. This can cause infection.  You may take cool baths to help stop itching.  Only take over-the-counter or prescription medicines as directed by your caregiver.  Keep all follow-up appointments as directed by your caregiver. SEEK IMMEDIATE MEDICAL CARE IF:  You have increasing pain, swelling, or redness.  You have a fever.  You have new or severe symptoms.  You have body aches, diarrhea, or vomiting.  Your rash is not better after 3 days. MAKE SURE YOU:  Understand these instructions.  Will watch your condition.  Will get help right away if you are not doing well or get worse.   This information is not intended to replace advice given to you by your health care provider. Make sure you discuss any questions you have with your health care provider.   Document Released: 05/06/2002 Document Revised: 06/06/2014 Document Reviewed: 10/01/2014 Elsevier Interactive Patient Education 2016 [employer]   IF you received an x-ray today, you will receive an invoice from [facility]. Please contact [facility] at [phone] with questions or concerns regarding your invoice.   IF you received labwork today, you will receive an invoice from [facility]. Please contact [facility] at [phone] with questions or concerns regarding your invoice.  Our billing staff will not be able to assist you with questions regarding bills from these companies.  You will be contacted with the lab results as soon as they are available. The fastest way to get your results is to activate your My Chart account. Instructions are located on the last page of this paperwork. If you have not heard from [place] regarding the  results in 2 weeks, please contact this office.

## 2016-03-21 NOTE — Progress Notes (Addendum)
Subjective:  By signing my name below, I, [name], attest that this documentation has been prepared under the direction and in the presence of [name] Ray, MD. Electronically Signed: [name], [place]. 03/21/2016 , 9:31 AM .  Patient was seen in Room 13 .   Patient ID: [name] Newton, male    DOB: 1960/07/01, 55 y.o.   MRN: [id number] Chief Complaint  Patient presents with  . Neck Pain  . Shoulder Pain  . SORES ON ARMS AND NECK   HPI [name] is a 55 y.o. male H/o DM, CAD, COPD, GERD, HTN. Here for neck pain into shoulder, and sores into his arms and his neck. He's followed by Dr. Burgess Estelle, MD for his diabetes; his last visit was Sept 18th. He's been on Lyrica for 3~4 years now.   review his last provider's office visit, appears he had a punch biopsy of his rash sept 18th visit. With plan to follow up in 1 month. Biopsies indicated psoriasiform and spongiotic dermatitis.   Neck + Arm Sores He complains having sores over his arms and his neck ongoing for 6 months, which started out of the blue. He states he's complained to his PCP at [facility], and was prescribed hydrocortisone cream. However, patient reports trying the cream for 3 days and notes that it made his itching worse. He denies any new medications or changes around that time. He denies any other rash over his legs or genitals. He denies feeling bugs crawling under his skin.   Neck/Shoulder Pain He also complains of neck pain into his shoulders, worsened over the past 2 weeks. He reports falling about a year ago. He denies any new injuries. He denies having xray done over the area.   Patient Active Problem List   Diagnosis Date Noted  . Rash 02/15/2016  . Stable angina ([place]) 02/15/2016  . Hyperlipidemia 11/23/2015  . Essential hypertension 07/26/2015  . Erectile dysfunction 07/26/2015  . Healthcare maintenance 07/15/2014  . GERD (gastroesophageal reflux disease) 07/24/2013  .  Tobacco abuse 12/03/2012  . Fatty infiltration of liver 07/18/2012  . COPD (chronic obstructive pulmonary disease) ([place]) 07/04/2012  . Hypertriglyceridemia   . Coronary artery disease   . Diabetes type 2, uncontrolled with neuropathy 05/30/2010   Past Medical History:  Diagnosis Date  . Asthma   . CHF (congestive heart failure) ([place])   . COPD (chronic obstructive pulmonary disease) ([place])   . Diabetes mellitus   . Guaiac positive stools     Guaiac positive stool with stable H and H.   . Hyperlipidemia   . Hypertension   . NSTEMI (non-ST elevated myocardial infarction) [facility]) April 2012   status post successful PCI and bare-metal  stenting of the first diagonal this admission.   . Tobacco abuse    Past Surgical History:  Procedure Laterality Date  . CARDIAC CATHETERIZATION     Normal EF. 90% diagonal lesion  . CORONARY STENT PLACEMENT  April 2012   Diagonal lesion. Bare metal  . UMBILICAL HERNIA REPAIR     Allergies  Allergen Reactions  . Codeine Nausea And Vomiting   Prior to Admission medications   Medication Sig Start Date End Date Taking? Authorizing Provider  ADVAIR DISKUS 100-50 MCG/DOSE AEPB Use 1 inhalation every 12  hours 09/14/15  Yes Burgess Estelle, MD  aspirin 81 MG tablet Take 1 tablet (81 mg total) by mouth daily as needed. 02/26/15  Yes Burgess Estelle, MD  atorvastatin (LIPITOR) 40 MG tablet  Take 1 tablet (40 mg total) by mouth daily. 02/15/16  Yes Burgess Estelle, MD  Blood Glucose Monitoring Suppl (ACCU-CHEK AVIVA PLUS) w/Device KIT Use as directed 07/23/15  Yes Marjan Rabbani, MD  glucose blood test strip Use to check blood sugar up to 1 times a day before  Meals Dx code- 250.00 02/15/16  Yes Burgess Estelle, MD  metoprolol tartrate (LOPRESSOR) 25 MG tablet Take 0.5 tablets (12.5 mg total) by mouth 2 (two) times daily. 02/15/16  Yes Burgess Estelle, MD  nitroGLYCERIN (NITROSTAT) 0.4 MG SL tablet Dissolve 1 tablet under the tongue every 5 minutes as  needed for chest pain  11/17/15  Yes Burgess Estelle, MD  pregabalin (LYRICA) 50 MG capsule Take 1 capsule (50 mg total) by mouth 3 (three) times daily. 01/26/16 01/25/17 Yes Burgess Estelle, MD  albuterol (PROAIR HFA) 108 (90 Base) MCG/ACT inhaler Inhale 2 puffs into the lungs every 6 (six) hours as needed for wheezing or shortness of breath. Patient not taking: Reported on 03/21/2016 07/23/15   Juluis Mire, MD  fenofibrate (TRICOR) 48 MG tablet Take 1 tablet (48 mg total) by mouth daily. Patient not taking: Reported on 03/21/2016 11/23/15   Burgess Estelle, MD  glipiZIDE (GLUCOTROL) 5 MG tablet TAKE 1 TABLET BY MOUTH  DAILY BEFORE BREAKFAST Patient not taking: Reported on 03/21/2016 03/16/16   Axel Filler, MD  hydrocortisone cream 1 % Apply to affected areas 2 times daily for no more than 2 weeks. Do NOT apply on open wounds Patient not taking: Reported on 03/21/2016 02/24/16 02/23/17  Burgess Estelle, MD  metFORMIN (GLUCOPHAGE) 1000 MG tablet Take 1 tablet (1,000 mg total) by mouth 2 (two) times daily with a meal. Patient not taking: Reported on 03/21/2016 11/23/15 11/22/16  Burgess Estelle, MD  sildenafil (VIAGRA) 100 MG tablet Take 1 tablet (100 mg total) by mouth daily as needed for erectile dysfunction. Patient not taking: Reported on 03/21/2016 06/02/15   Thayer Headings, MD   Social History   Social History  . Marital status: Divorced    Spouse name: N/A  . Number of children: N/A  . Years of education: 10   Occupational History  . none     Unemployed since 2012   Social History Main Topics  . Smoking status: Current Some Day Smoker    Packs/day: 0.50    Types: Cigarettes  . Smokeless tobacco: Current User    Types: Chew     Comment: Patches do not work.  Cutting back.  Wants Chantix  . Alcohol use No  . Drug use: No  . Sexual activity: Not on file   Other Topics Concern  . Not on file   Social History Narrative   Patient is intermittently homeless without any income at the moment.           Review of Systems  Constitutional: Negative for chills, fatigue and fever.  Gastrointestinal: Negative for diarrhea, nausea and vomiting.  Musculoskeletal: Positive for arthralgias, neck pain and neck stiffness. Negative for back pain, gait problem and joint swelling.  Skin: Positive for rash and wound.  Psychiatric/Behavioral: Negative for hallucinations.       Objective:   Physical Exam  Constitutional: He is oriented to person, place, and time. He appears well-developed and well-nourished.  HENT:  Head: Normocephalic and atraumatic.  Eyes: EOM are normal. Pupils are equal, round, and reactive to light.  Neck: No JVD present. Carotid bruit is not present.  Limitation in extension and flexion with some pain  into the paraspinals with rotation, no midline bony tenderness  Cardiovascular: Normal rate, regular rhythm and normal heart sounds.   No murmur heard. Pulmonary/Chest: Effort normal and breath sounds normal. He has no rales.  Musculoskeletal: He exhibits no edema.  Neurological: He is alert and oriented to person, place, and time.  Reflexes of upper extremities are equal  Skin: Skin is warm and dry.  Right arm: diffuse erythematous patches with marked excoriation and scabbing with multiple healed areas Left arm: has few patches around elbow and excoriation with some scabbing Left lateral neck: multiple erythematous patches along left side anterior neck, worse on the posterior neck with small area of bleeding from excoriation, small patches of dry skin with scabs, similar patches over the right ear.  Hands: no interdigital lesions  Psychiatric: He has a normal mood and affect.  Vitals reviewed.   Vitals:   03/21/16 0845  BP: 124/82  Pulse: 77  Resp: 18  Temp: 97.8 F (36.6 C)  TempSrc: Oral  SpO2: 97%  Weight: 208 lb (94.3 kg)  Height: _0  (1.93 m)      Assessment & Plan:    JOHNRYAN SAO is a 55 y.o. 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 On initial history, patient did not disclose that he had biopsies or this information, as wanted a second opinion.  - Some areas appear to be healing, improved per patient, but still diffuse excoriated areas, and psoriasis possible. We'll try Sarna lotion, Aveeno lotion, mild soap such as Dove, and Zyrtec over-the-counter to lessen itching, refer to dermatology, and avoid steroid cream for now as he did not tolerate that previously. RTC precautions.  Neck pain  - Long-standing issue with recent increased symptoms on the right paraspinals and trapezius. Possible DDD with mild cervical radiculopathy. Arm appears to be unaffected. Strength intact.  -Tylenol over-the-counter (with his history of CAD will avoid NSAIDs). Flexeril at nighttime if needed. Side effects discussed. Return to clinic in 2 weeks if not improving for possible x-Newton or referral to orthopedics.  Other questions regarding his medications and possible transfer of care. Advised to discuss those questions with primary care provider, or follow-up for separate visit to discuss further issues.   Meds ordered this encounter  Medications  . cyclobenzaprine (FLEXERIL) 5 MG tablet    Sig: 1 pill by mouth up to every 8 hours as needed. Start with one pill by mouth each bedtime as needed due to sedation    Dispense:  15 tablet    Refill:  0   Patient Instructions   For your rash, you can try Zyrtec over-the-counter 1 per day to help with itching, Aveeno lotion, and if needed Sarna lotion over-the-counter. Wash over those areas with soap and water at least once per day with a mild soap such as Dove as certain soaps can cause irritation and itching of the skin. Based on your last biopsy results with your primary care provider, the rash may be due to a type of psoriasis. This is often treated with steroid creams, but since you did not tolerate the previous  steroid cream, I will refer you to dermatology to discuss other treatments.  For your neck pain, try Tylenol over-the-counter up to 4 times per day, heat and gentle range of motion, and if needed, I prescribed a muscle relaxant (flexeril). Be careful taking this medication as it does cause sedation and risk of  falling. Again, follow-up in 2 weeks and at that time if not improved, can check an x-Newton or possibly refer you to an orthopedist.  For questions regarding your other medicines and diabetes medicines, would recommend you contact your primary care provider. If any other acute concerns, please return to see [place].  Return to the clinic or go to the nearest emergency room if any of your symptoms worsen or new symptoms occur.   Rash A rash is a change in the color or texture of the skin. There are many different types of rashes. You may have other problems that accompany your rash. CAUSES   Infections.  Allergic reactions. This can include allergies to pets or foods.  Certain medicines.  Exposure to certain chemicals, soaps, or cosmetics.  Heat.  Exposure to poisonous plants.  Tumors, both cancerous and noncancerous. SYMPTOMS   Redness.  Scaly skin.  Itchy skin.  Dry or cracked skin.  Bumps.  Blisters.  Pain. DIAGNOSIS  Your caregiver may do a physical exam to determine what type of rash you have. A skin sample (biopsy) may be taken and examined under a microscope. TREATMENT  Treatment depends on the type of rash you have. Your caregiver may prescribe certain medicines. For serious conditions, you may need to see a skin doctor (dermatologist). HOME CARE INSTRUCTIONS   Avoid the substance that caused your rash.  Do not scratch your rash. This can cause infection.  You may take cool baths to help stop itching.  Only take over-the-counter or prescription medicines as directed by your caregiver.  Keep all follow-up appointments as directed by your caregiver. SEEK  IMMEDIATE MEDICAL CARE IF:  You have increasing pain, swelling, or redness.  You have a fever.  You have new or severe symptoms.  You have body aches, diarrhea, or vomiting.  Your rash is not better after 3 days. MAKE SURE YOU:  Understand these instructions.  Will watch your condition.  Will get help right away if you are not doing well or get worse.   This information is not intended to replace advice given to you by your health care provider. Make sure you discuss any questions you have with your health care provider.   Document Released: 05/06/2002 Document Revised: 06/06/2014 Document Reviewed: 10/01/2014 Elsevier Interactive Patient Education 2016 [employer].   IF you received an x-Newton today, you will receive an invoice from [facility]. Please contact [facility] at [phone] with questions or concerns regarding your invoice.   IF you received labwork today, you will receive an invoice from [employer]. Please contact [facility] at [phone] with questions or concerns regarding your invoice.   Our billing staff will not be able to assist you with questions regarding bills from these companies.  You will be contacted with the lab results as soon as they are available. The fastest way to get your results is to activate your My Chart account. Instructions are located on the last page of this paperwork. If you have not heard from [place] regarding the results in 2 weeks, please contact this office.       I personally performed the services described in this documentation, which was scribed in my presence. The recorded information has been reviewed and considered, and addended by me as needed.   Signed,   [name] Ray, MD [facility].  03/21/16 9:54 AM

## 2016-03-26 ENCOUNTER — Other Ambulatory Visit: Payer: Self-pay | Admitting: Internal Medicine

## 2016-03-28 NOTE — Telephone Encounter (Signed)
I have not gotten this fax

## 2016-03-31 ENCOUNTER — Encounter: Payer: Self-pay | Admitting: *Deleted

## 2016-04-04 ENCOUNTER — Other Ambulatory Visit: Payer: Self-pay | Admitting: Internal Medicine

## 2016-04-18 DIAGNOSIS — Z0000 Encounter for general adult medical examination without abnormal findings: Secondary | ICD-10-CM

## 2016-04-18 NOTE — Assessment & Plan Note (Signed)
I reviewed the patient's Heme + occult card, received in Sept 2017  Patient was heme positive in 2012, and then had subsequent negative results in 2014, and then in 2015,.  In the prior visits, pt does not prefer to do the colonoscopy even though he is a candidate given his age.  Will address the heme + result with the patient at the next appointment and inform him of the recommendation for colonoscopy.

## 2016-04-30 ENCOUNTER — Other Ambulatory Visit: Payer: Self-pay | Admitting: Internal Medicine

## 2016-05-03 NOTE — Telephone Encounter (Signed)
Agreed -

## 2016-06-22 ENCOUNTER — Other Ambulatory Visit: Payer: Self-pay | Admitting: Internal Medicine

## 2016-06-24 ENCOUNTER — Ambulatory Visit (INDEPENDENT_AMBULATORY_CARE_PROVIDER_SITE_OTHER): Payer: 59 | Admitting: Family Medicine

## 2016-06-24 ENCOUNTER — Telehealth: Payer: Self-pay | Admitting: *Deleted

## 2016-06-24 VITALS — BP 120/78 | HR 92 | Temp 98.2°F | Resp 18 | Ht 76.0 in | Wt 209.0 lb

## 2016-06-24 DIAGNOSIS — E1310 Other specified diabetes mellitus with ketoacidosis without coma: Secondary | ICD-10-CM

## 2016-06-24 DIAGNOSIS — S51012S Laceration without foreign body of left elbow, sequela: Secondary | ICD-10-CM | POA: Diagnosis not present

## 2016-06-24 DIAGNOSIS — Z5189 Encounter for other specified aftercare: Secondary | ICD-10-CM

## 2016-06-24 DIAGNOSIS — E1110 Type 2 diabetes mellitus with ketoacidosis without coma: Secondary | ICD-10-CM

## 2016-06-24 MED ORDER — SULFAMETHOXAZOLE-TRIMETHOPRIM 800-160 MG PO TABS: 1.0000 | ORAL_TABLET | Freq: Two times a day (BID) | ORAL | 0 refills | Status: DC

## 2016-06-24 MED ORDER — SULFAMETHOXAZOLE-TRIMETHOPRIM 800-160 MG PO TABS: 1.0000 | ORAL_TABLET | Freq: Two times a day (BID) | ORAL | 0 refills | Status: AC

## 2016-06-24 NOTE — Telephone Encounter (Signed)
Thank you :)

## 2016-06-24 NOTE — Progress Notes (Signed)
Chief Complaint  Patient presents with  . Wound Infection    L elbow infection x 7 months.    HPI   Pt reports that 7 months ago he fell and noted that he had sor He states that he was seen for skin infection with sores He is a known diabetic Lab Results  Component Value Date   HGBA1C 6.8 02/15/2016  He had a skin test and was given hydrocortisone   He reports that he fell onto his elbow 7 months before and reports that the left elbow is still leaking pus and his dog licks it. He reports that he had a painful erythematous elbow.  He states that he tried the hydrocortisone but did not use any topical antibiotics otc.    Past Medical History:  Diagnosis Date  . Asthma   . CHF (congestive heart failure) ([place])   . COPD (chronic obstructive pulmonary disease) ([place])   . Diabetes mellitus   . Guaiac positive stools     Guaiac positive stool with stable H and H.   . Hyperlipidemia   . Hypertension   . NSTEMI (non-ST elevated myocardial infarction) [facility]) April 2012   status post successful PCI and bare-metal  stenting of the first diagonal this admission.   . Tobacco abuse     Current Outpatient Prescriptions  Medication Sig Dispense Refill  . ADVAIR DISKUS 100-50 MCG/DOSE AEPB Use 1 inhalation every 12  hours 180 each 3  . aspirin 81 MG tablet Take 1 tablet (81 mg total) by mouth daily as needed. 90 tablet 4  . atorvastatin (LIPITOR) 40 MG tablet Take 1 tablet (40 mg total) by mouth daily. 90 tablet 1  . Blood Glucose Monitoring Suppl (ACCU-CHEK AVIVA PLUS) w/Device KIT Use as directed 1 kit 2  . cyclobenzaprine (FLEXERIL) 5 MG tablet 1 pill by mouth up to every 8 hours as needed. Start with one pill by mouth each bedtime as needed due to sedation 15 tablet 0  . glipiZIDE (GLUCOTROL) 5 MG tablet TAKE 1 TABLET BY MOUTH  DAILY BEFORE BREAKFAST 90 tablet 2  . glucose blood test strip Use to check blood sugar up to 1 times a day before  Meals Dx code- 250.00 100 each 12  .  metFORMIN (GLUCOPHAGE) 1000 MG tablet Take 1 tablet (1,000 mg total) by mouth 2 (two) times daily with a meal. 180 tablet 11  . nitroGLYCERIN (NITROSTAT) 0.4 MG SL tablet DISSOLVE 1 TABLET UNDER THE TONGUE EVERY 5 MINUTES AS  NEEDED FOR CHEST PAIN 100 tablet 0  . pregabalin (LYRICA) 50 MG capsule Take 1 capsule (50 mg total) by mouth 3 (three) times daily. 270 capsule 3  . albuterol (PROAIR HFA) 108 (90 Base) MCG/ACT inhaler Inhale 2 puffs into the lungs every 6 (six) hours as needed for wheezing or shortness of breath. (Patient not taking: Reported on 03/21/2016) 1 Inhaler 2  . fenofibrate (TRICOR) 48 MG tablet Take 1 tablet (48 mg total) by mouth daily. (Patient not taking: Reported on 06/24/2016) 30 tablet 0  . metoprolol tartrate (LOPRESSOR) 25 MG tablet TAKE ONE-HALF TABLET BY  MOUTH TWO TIMES DAILY (Patient not taking: Reported on 06/24/2016) 60 tablet 3   No current facility-administered medications for this visit.     Allergies:  Allergies  Allergen Reactions  . Codeine Nausea And Vomiting    Past Surgical History:  Procedure Laterality Date  . CARDIAC CATHETERIZATION     Normal EF. 90% diagonal lesion  . CORONARY STENT PLACEMENT  April 2012   Diagonal lesion. Bare metal  . UMBILICAL HERNIA REPAIR      Social History   Social History  . Marital status: Divorced    Spouse name: N/A  . Number of children: N/A  . Years of education: 10   Occupational History  . none     Unemployed since 2012   Social History Main Topics  . Smoking status: Current Some Day Smoker    Packs/day: 0.50    Types: Cigarettes  . Smokeless tobacco: Current User    Types: Chew     Comment: Patches do not work.  Cutting back.  Wants Chantix  . Alcohol use No  . Drug use: No  . Sexual activity: Not Asked   Other Topics Concern  . None   Social History Narrative   Patient is intermittently homeless without any income at the moment.           ROS  Objective: Vitals:   06/24/16 1406    BP: 120/78  Pulse: 92  Resp: 18  Temp: 98.2 F (36.8 C)  TempSrc: Oral  SpO2: 97%  Weight: 209 lb (94.8 kg)  Height: 6' 4"  (1.93 m)    Physical Exam  Assessment and Plan There are no diagnoses linked to this encounter.   [place]

## 2016-06-24 NOTE — Telephone Encounter (Signed)
Call from pt - states his elbow is infected. States he has been using hydrocortisone cream but not working. States after taking his shower, he pull back the layer of skin over the sore and "something felled out". He doesn't what it was; but said "it has been leaking pus". And he's on his way over here but told him we do not have any appts today unless someone cancels.  Discussed going to urgent care which he plans on doing.

## 2016-06-24 NOTE — Patient Instructions (Addendum)
     IF you received an x-ray today, you will receive an invoice from [facility]. Please contact [facility] at [phone] with questions or concerns regarding your invoice.   IF you received labwork today, you will receive an invoice from [facility]. Please contact [facility] at [phone] with questions or concerns regarding your invoice.   Our billing staff will not be able to assist you with questions regarding bills from these companies.  You will be contacted with the lab results as soon as they are available. The fastest way to get your results is to activate your My Chart account. Instructions are located on the last page of this paperwork. If you have not heard from us regarding the results in 2 weeks, please contact this office.     Delayed Wound Closure Sometimes, your health care provider will decide to delay closing a wound for several days. This is done when the wound is badly bruised, dirty, or when it has been several hours since the injury happened. By delaying the closure of your wound, the risk of infection is reduced. Wounds that are closed in 3-7 days after being cleaned up and dressed heal just as well as those that are closed right away. Follow these instructions at home:  Rest and elevate the injured area until the pain and swelling are gone.  Have your wound checked as instructed by your health care provider. Contact a health care provider if:  You develop unusual or increased swelling or redness around the wound.  You have increasing pain or tenderness.  There is increasing fluid (drainage) or a bad smelling drainage coming from the wound. This information is not intended to replace advice given to you by your health care provider. Make sure you discuss any questions you have with your health care provider. Document Released: 05/16/2005 Document Revised: 10/22/2015 Document Reviewed: 11/13/2012 Elsevier Interactive Patient Education  2017  [employer]

## 2016-06-24 NOTE — Telephone Encounter (Signed)
Per EPIC, pt was seen [facility].

## 2016-07-01 ENCOUNTER — Telehealth: Payer: Self-pay | Admitting: Internal Medicine

## 2016-07-01 NOTE — Telephone Encounter (Signed)
APT. REMINDER CALL, LMTCB °

## 2016-07-04 ENCOUNTER — Encounter: Payer: Medicare Other | Admitting: Internal Medicine

## 2016-07-04 ENCOUNTER — Encounter: Payer: Self-pay | Admitting: Internal Medicine

## 2016-07-06 ENCOUNTER — Ambulatory Visit (INDEPENDENT_AMBULATORY_CARE_PROVIDER_SITE_OTHER): Payer: Medicare Other | Admitting: Family Medicine

## 2016-07-06 ENCOUNTER — Encounter: Payer: Self-pay | Admitting: Family Medicine

## 2016-07-06 VITALS — BP 136/81 | HR 83 | Temp 97.9°F | Ht 76.0 in | Wt 208.4 lb

## 2016-07-06 DIAGNOSIS — E1165 Type 2 diabetes mellitus with hyperglycemia: Secondary | ICD-10-CM

## 2016-07-06 DIAGNOSIS — I25708 Atherosclerosis of coronary artery bypass graft(s), unspecified, with other forms of angina pectoris: Secondary | ICD-10-CM

## 2016-07-06 DIAGNOSIS — E781 Pure hyperglyceridemia: Secondary | ICD-10-CM | POA: Diagnosis not present

## 2016-07-06 DIAGNOSIS — J4540 Moderate persistent asthma, uncomplicated: Secondary | ICD-10-CM

## 2016-07-06 DIAGNOSIS — E1143 Type 2 diabetes mellitus with diabetic autonomic (poly)neuropathy: Secondary | ICD-10-CM

## 2016-07-06 DIAGNOSIS — Z8249 Family history of ischemic heart disease and other diseases of the circulatory system: Secondary | ICD-10-CM | POA: Diagnosis not present

## 2016-07-06 DIAGNOSIS — F17200 Nicotine dependence, unspecified, uncomplicated: Secondary | ICD-10-CM | POA: Diagnosis not present

## 2016-07-06 DIAGNOSIS — E785 Hyperlipidemia, unspecified: Secondary | ICD-10-CM | POA: Diagnosis not present

## 2016-07-06 DIAGNOSIS — R5383 Other fatigue: Secondary | ICD-10-CM | POA: Diagnosis not present

## 2016-07-06 DIAGNOSIS — IMO0002 Reserved for concepts with insufficient information to code with codable children: Secondary | ICD-10-CM

## 2016-07-06 DIAGNOSIS — J449 Chronic obstructive pulmonary disease, unspecified: Secondary | ICD-10-CM

## 2016-07-06 DIAGNOSIS — E1121 Type 2 diabetes mellitus with diabetic nephropathy: Secondary | ICD-10-CM

## 2016-07-06 DIAGNOSIS — Z125 Encounter for screening for malignant neoplasm of prostate: Secondary | ICD-10-CM

## 2016-07-06 DIAGNOSIS — Z23 Encounter for immunization: Secondary | ICD-10-CM | POA: Diagnosis not present

## 2016-07-06 DIAGNOSIS — I25810 Atherosclerosis of coronary artery bypass graft(s) without angina pectoris: Secondary | ICD-10-CM | POA: Diagnosis not present

## 2016-07-06 HISTORY — DX: Hyperlipidemia, unspecified: E78.5

## 2016-07-06 HISTORY — DX: Type 2 diabetes mellitus with diabetic autonomic (poly)neuropathy: E11.43

## 2016-07-06 MED ORDER — GLIPIZIDE 5 MG PO TABS: ORAL_TABLET | ORAL | 2 refills | Status: DC

## 2016-07-06 MED ORDER — ASPIRIN 81 MG PO TABS: 81.0000 mg | ORAL_TABLET | Freq: Every day | ORAL | 4 refills | Status: DC | PRN

## 2016-07-06 MED ORDER — GLUCOSE BLOOD VI STRP: ORAL_STRIP | 12 refills | Status: DC

## 2016-07-06 MED ORDER — ACCU-CHEK SOFTCLIX LANCET DEV KIT: PACK | 5 refills | Status: DC

## 2016-07-06 MED ORDER — ACCU-CHEK AVIVA PLUS W/DEVICE KIT: PACK | 2 refills | Status: DC

## 2016-07-06 MED ORDER — ALBUTEROL SULFATE HFA 108 (90 BASE) MCG/ACT IN AERS: 2.0000 | INHALATION_SPRAY | Freq: Four times a day (QID) | RESPIRATORY_TRACT | 2 refills | Status: DC | PRN

## 2016-07-06 MED ORDER — VARENICLINE TARTRATE 1 MG PO TABS: 1.0000 mg | ORAL_TABLET | Freq: Two times a day (BID) | ORAL | 2 refills | Status: DC

## 2016-07-06 MED ORDER — VARENICLINE TARTRATE 0.5 MG PO TABS
ORAL_TABLET | ORAL | 0 refills | Status: DC
Start: 2016-07-06 — End: 2016-09-20

## 2016-07-06 MED ORDER — METOPROLOL TARTRATE 25 MG PO TABS: ORAL_TABLET | ORAL | 3 refills | Status: DC

## 2016-07-06 MED ORDER — FLUTICASONE-SALMETEROL 100-50 MCG/DOSE IN AEPB: 1.0000 | INHALATION_SPRAY | Freq: Two times a day (BID) | RESPIRATORY_TRACT | 1 refills | Status: DC

## 2016-07-06 MED ORDER — PREGABALIN 50 MG PO CAPS: 50.0000 mg | ORAL_CAPSULE | Freq: Three times a day (TID) | ORAL | 3 refills | Status: DC

## 2016-07-06 MED ORDER — METFORMIN HCL 1000 MG PO TABS: 1000.0000 mg | ORAL_TABLET | Freq: Two times a day (BID) | ORAL | 3 refills | Status: DC

## 2016-07-06 MED ORDER — ATORVASTATIN CALCIUM 40 MG PO TABS: 40.0000 mg | ORAL_TABLET | Freq: Every day | ORAL | 1 refills | Status: DC

## 2016-07-06 MED ORDER — FENOFIBRATE 48 MG PO TABS: 48.0000 mg | ORAL_TABLET | Freq: Every day | ORAL | 3 refills | Status: DC

## 2016-07-06 NOTE — Progress Notes (Signed)
Chief Complaint  Patient presents with  . Follow-up    2 week f/u "elbow"    HPI   Diabetes Diabetes Mellitus: Patient presents for follow up of diabetes. Symptoms: hyperglycemia. He reports that his feet feel like they are on fire and takes lyrica capsules which helps with the pain. Symptoms have improved, beginning since starting metformin and glipizide  years ago. Patient denies hypoglycemia .   Evaluation to date has been included: fasting blood sugar and hemoglobin A1C.  Home sugars: patient does not check sugars. Treatment to date: no recent interventions.  Lab Results  Component Value Date   HGBA1C 6.8 02/15/2016   Eye exam 2017 Podiatry never Urine microalbumin 2016 Ace inhibitor/arb- not indicated Takes Aspirin and Lipitor  CAD Coronary Artery Disease: Patient presents for routine coronary artery disease follow-up.  Current symptoms: chest tightness two days ago that resolved with 2 NG. Pain radiation: none Patient also complains of no other symptoms. Pain aggravating factors: exertion Pain relieved by NTG.  Cardiac risk factors include advanced age (older than 31 for men, 31 for women), diabetes mellitus, dyslipidemia, family history of premature cardiovascular disease, hypertension, male gender and smoking/ tobacco exposure.   Asthma Asthma: [name] Newton presents for evaluation of asthma. Patient's symptoms include occasional chest tightness. The patient has been suffering from these symptoms not recently although he admits 2 days ago he had ches ttightness but thinks it is from his heart.  He uses advair bid and albuterol almost daily. Medications used in the past to treat these symptoms include beta agonist inhalers and combination beta agonists/steroid inhalers. Suspected precipitants include fumes, upper respiratory infection and humidity. Patient is awoken from sleep approximately none times per night. Patient has not required Emergency Room treatment for these  symptoms, and has not required hospitalization. The patient has not been intubated in the past.  Smoking Reports that he has been smoking since age 2 He states that he was smoking 2 packs a day for 40 years (18 pack year) and recently cut down to 5-10 cigs a day He reports that his sister died from an asthma attack in her sleep His father died from a massive MI and his brother had a MI but lived. He wants to quit smoking for his 76 old son and feels like he is ready.  He tried patches and gums in the past and was afraid of chantix but a friend of his was able to quit but he is worried about side effects.  He does not want to get the CT lung cancer screening at this time. He wants to quit smoking first.   Depression Pt reports that he has been staying up at night because eh is afraid he might die in his sleep.  He states that his sister died in her sleep and his father had a heart attack He reports that he lays around and has no motivation to do anything He states that he has never thought he needed treatment for this  His wife is very supportive He denies SI He feels like he would feel better about himself if he quit smoking.  Depression screen [facility] 2/9 07/06/2016 06/24/2016 03/21/2016 02/15/2016 11/23/2015  Decreased Interest 2 0 0 0 0  Down, Depressed, Hopeless 1 0 0 0 0  PHQ - 2 Score 3 0 0 0 0  Altered sleeping 1 - - - -  Tired, decreased energy 1 - - - -  Change in appetite 1 - - - -  Feeling bad or failure about yourself  1 - - - -  Trouble concentrating 0 - - - -  Moving slowly or fidgety/restless 1 - - - -  Suicidal thoughts 0 - - - -  PHQ-9 Score 8 - - - -    Hyperlipidemia Hyperlipidemia: Patient presents with hyperlipidemia.  He was tested because of his heart attack.   chest pain. There is a family history of hyperlipidemia. There is a family history of early ischemia heart disease. Lab Results  Component Value Date   CHOL 309 (H) 12/15/2014   HDL 31 (L) 12/15/2014     LDLCALC NOT CALC 12/15/2014   LDLDIRECT 152 (H) 12/16/2014   TRIG 685 (H) 12/15/2014   CHOLHDL 10.0 12/15/2014      Past Medical History:  Diagnosis Date  . Asthma   . CHF (congestive heart failure) ([place])   . COPD (chronic obstructive pulmonary disease) ([place])   . Diabetes mellitus   . Guaiac positive stools     Guaiac positive stool with stable H and H.   . Hyperlipidemia   . Hypertension   . NSTEMI (non-ST elevated myocardial infarction) [facility]) April 2012   status post successful PCI and bare-metal  stenting of the first diagonal this admission.   . Tobacco abuse     Current Outpatient Prescriptions  Medication Sig Dispense Refill  . albuterol (PROAIR HFA) 108 (90 Base) MCG/ACT inhaler Inhale 2 puffs into the lungs every 6 (six) hours as needed for wheezing or shortness of breath. 1 Inhaler 2  . aspirin 81 MG tablet Take 1 tablet (81 mg total) by mouth daily as needed. 90 tablet 4  . atorvastatin (LIPITOR) 40 MG tablet Take 1 tablet (40 mg total) by mouth daily. 90 tablet 1  . Blood Glucose Monitoring Suppl (ACCU-CHEK AVIVA PLUS) w/Device KIT Use as directed 1 kit 2  . fenofibrate (TRICOR) 48 MG tablet Take 1 tablet (48 mg total) by mouth daily. 90 tablet 3  . Fluticasone-Salmeterol (ADVAIR DISKUS) 100-50 MCG/DOSE AEPB Inhale 1 puff into the lungs 2 (two) times daily. 180 each 1  . glipiZIDE (GLUCOTROL) 5 MG tablet TAKE 1 TABLET BY MOUTH  DAILY BEFORE BREAKFAST 90 tablet 2  . glucose blood test strip Use to check blood sugar up to 1 times a day before  Meals Dx code- 250.00 100 each 12  . metFORMIN (GLUCOPHAGE) 1000 MG tablet Take 1 tablet (1,000 mg total) by mouth 2 (two) times daily with a meal. 180 tablet 3  . metoprolol tartrate (LOPRESSOR) 25 MG tablet TAKE ONE-HALF TABLET BY  MOUTH TWO TIMES DAILY 60 tablet 3  . nitroGLYCERIN (NITROSTAT) 0.4 MG SL tablet DISSOLVE 1 TABLET UNDER THE TONGUE EVERY 5 MINUTES AS  NEEDED FOR CHEST PAIN 100 tablet 0  . pregabalin (LYRICA) 50 MG  capsule Take 1 capsule (50 mg total) by mouth 3 (three) times daily. 270 capsule 3  . Lancets Misc. (ACCU-CHEK SOFTCLIX LANCET DEV) KIT Use to test blood glucose daily. E11.9 1 kit 5  . varenicline (CHANTIX) 0.5 MG tablet Days 1 to 3: 0.5 mg once daily. Days 4 to 7: 0.5 mg twice daily 10 tablet 0  . varenicline (CHANTIX) 1 MG tablet Take 1 tablet (1 mg total) by mouth 2 (two) times daily. 60 tablet 2   No current facility-administered medications for this visit.     Allergies:  Allergies  Allergen Reactions  . Codeine Nausea And Vomiting    Past Surgical History:  Procedure  Laterality Date  . CARDIAC CATHETERIZATION     Normal EF. 90% diagonal lesion  . CORONARY STENT PLACEMENT  April 2012   Diagonal lesion. Bare metal  . UMBILICAL HERNIA REPAIR      Social History   Social History  . Marital status: Divorced    Spouse name: N/A  . Number of children: N/A  . Years of education: 10   Occupational History  . none     Unemployed since 2012   Social History Main Topics  . Smoking status: Current Some Day Smoker    Packs/day: 0.50    Types: Cigarettes  . Smokeless tobacco: Current User    Types: Chew     Comment: Patches do not work.  Cutting back.  Wants Chantix  . Alcohol use No  . Drug use: No  . Sexual activity: Not Asked   Other Topics Concern  . None   Social History Narrative   Patient is intermittently homeless without any income at the moment.           Review of Systems  Constitutional: Negative for chills and fever.  Respiratory: Negative for cough, shortness of breath and wheezing.   Cardiovascular: Positive for chest pain. Negative for palpitations, orthopnea, claudication and leg swelling.       He used his nitroglycerin 2 days ago  Gastrointestinal: Negative for abdominal pain, blood in stool, constipation, nausea and vomiting.  Skin: Negative for itching and rash.  Neurological: Negative for dizziness, tingling and headaches.    Psychiatric/Behavioral: Positive for depression. The patient is not nervous/anxious.     Objective: Vitals:   07/06/16 1004  BP: 136/81  Pulse: 83  Temp: 97.9 F (36.6 C)  TempSrc: Oral  SpO2: 95%  Weight: 208 lb 6.4 oz (94.5 kg)  Height: 6' 4"  (1.93 m)   Diabetic Foot Exam - Simple   Simple Foot Form Visual Inspection No deformities, no ulcerations, no other skin breakdown bilaterally:  Yes Sensation Testing See comments:  Yes Pulse Check Posterior Tibialis and Dorsalis pulse intact bilaterally:  Yes Comments Unable to detect monofilament at 5 sites tested     Physical Exam  Constitutional: He is oriented to person, place, and time. He appears well-developed and well-nourished.  HENT:  Head: Normocephalic and atraumatic.  Right Ear: External ear normal.  Left Ear: External ear normal.  Nose: Nose normal.  Mouth/Throat: Oropharynx is clear and moist.  Eyes: Conjunctivae and EOM are normal.  Cardiovascular: Normal rate, regular rhythm and normal heart sounds.   No murmur heard. Pulmonary/Chest: Effort normal and breath sounds normal. No respiratory distress. He has no wheezes. He has no rales.  Abdominal: Soft. Bowel sounds are normal. He exhibits no distension and no mass. There is no tenderness. There is no rebound and no guarding. No hernia.  Musculoskeletal: Normal range of motion. He exhibits no edema.  Neurological: He is alert and oriented to person, place, and time.  Skin: Skin is warm. Capillary refill takes less than 2 seconds. No rash noted. No erythema.  Psychiatric: He has a normal mood and affect. His behavior is normal. Judgment and thought content normal.    Assessment and Plan [name] Newton was seen today for follow-up.  Diagnoses and all orders for this visit:  Coronary artery disease of bypass graft of native heart with stable angina pectoris ([place])- advised follow up with Cardiology Discussed repeating lipids -     Lipid panel -     TSH -  Flu Vaccine QUAD 36+ mos IM  Uncontrolled type 2 diabetes mellitus with diabetic nephropathy, without long-term current use of insulin ([place])-  Last a1c controlled but patient feels levels are out of range Will also refer to diabetic education Will monitor for medications -     Discontinue: Blood Glucose Monitoring Suppl (ACCU-CHEK AVIVA PLUS) w/Device KIT; Use as directed -     Lipid panel -     Hemoglobin A1c -     Microalbumin, urine -     Comprehensive metabolic panel -     Blood Glucose Monitoring Suppl (ACCU-CHEK AVIVA PLUS) w/Device KIT; Use as directed -     glucose blood test strip; Use to check blood sugar up to 1 times a day before  Meals Dx code- 250.00 -     Ambulatory referral to diabetic education -     Cancel: Flu Vaccine QUAD 36+ mos IM -     Flu Vaccine QUAD 36+ mos IM -     atorvastatin (LIPITOR) 40 MG tablet; Take 1 tablet (40 mg total) by mouth daily. -     pregabalin (LYRICA) 50 MG capsule; Take 1 capsule (50 mg total) by mouth 3 (three) times daily.  Dyslipidemia- will check levels -     Lipid panel -     Comprehensive metabolic panel -     Flu Vaccine QUAD 36+ mos IM  Tobacco use disorder- spent time reviewing usage, discussed risks of chantix Discussed the risk of smoking Pt seems motivated  Discussed that chantix out of pocket cause as much as a month of cigarettes -     Flu Vaccine QUAD 36+ mos IM  Screening for prostate cancer -     PSA  Diabetic autonomic neuropathy associated with type 2 diabetes mellitus ([place]) -  Foot exam shows neuropathy -  Discussed foot care -  Refilled lyrica today  Fatigue, unspecified type -     CBC with Differential/Platelet  Family history of heart attack- discussed family history of heart attack and his risks Pt will quit smoking today and follow up with Cardiology  Flu vaccine need -     Flu Vaccine QUAD 36+ mos IM  Moderate persistent asthma without complication- discussed flu vaccination and smoking cessation    -     Flu Vaccine QUAD 36+ mos IM  Chronic obstructive pulmonary disease, unspecified COPD type ([place])- discussed smoking cessation  Refilled medications -     albuterol (PROAIR HFA) 108 (90 Base) MCG/ACT inhaler; Inhale 2 puffs into the lungs every 6 (six) hours as needed for wheezing or shortness of breath.  Hypertriglyceridemia- refilled medication  Reviewed previous labs -     fenofibrate (TRICOR) 48 MG tablet; Take 1 tablet (48 mg total) by mouth daily.  Other orders -     Lancets Misc. (ACCU-CHEK SOFTCLIX LANCET DEV) KIT; Use to test blood glucose daily. E11.9 -     varenicline (CHANTIX) 0.5 MG tablet; Days 1 to 3: 0.5 mg once daily. Days 4 to 7: 0.5 mg twice daily -     varenicline (CHANTIX) 1 MG tablet; Take 1 tablet (1 mg total) by mouth 2 (two) times daily. -     Fluticasone-Salmeterol (ADVAIR DISKUS) 100-50 MCG/DOSE AEPB; Inhale 1 puff into the lungs 2 (two) times daily. -     aspirin 81 MG tablet; Take 1 tablet (81 mg total) by mouth daily as needed. -     glipiZIDE (GLUCOTROL) 5 MG tablet; TAKE 1 TABLET BY MOUTH  DAILY BEFORE BREAKFAST -     metFORMIN (GLUCOPHAGE) 1000 MG tablet; Take 1 tablet (1,000 mg total) by mouth 2 (two) times daily with a meal. -     metoprolol tartrate (LOPRESSOR) 25 MG tablet; TAKE ONE-HALF TABLET BY  MOUTH TWO TIMES DAILY   A total of 60 minutes were spent face-to-face with the patient during this encounter and over half of that time was spent on counseling and coordination of care.   [place]

## 2016-07-06 NOTE — Patient Instructions (Addendum)
Call your Cardiologist for an appointment Newton Newton PingPhilip J, MD  04/02/2015 9:43 AM    [facility] [address],  [address] [place], [place]  [address] Pager [phone] Phone: [phone]) [phone]; Fax: [phone]) [phone]    For your Chantix take: Days 1 to 3: 0.5 mg once daily Days 4 to 7: 0.5 mg twice daily After Day 8: take 1 mg twice daily for 11 weeks    IF you received an x-ray today, you will receive an invoice from [facility]. Please contact [facility] at [phone] with questions or concerns regarding your invoice.   IF you received labwork today, you will receive an invoice from [facility]. Please contact [facility] at [phone] with questions or concerns regarding your invoice.   Our billing staff will not be able to assist you with questions regarding bills from these companies.  You will be contacted with the lab results as soon as they are available. The fastest way to get your results is to activate your My Chart account. Instructions are located on the last page of this paperwork. If you have not heard from us regarding the results in 2 weeks, please contact this office.      Diabetes Mellitus and Skin Care Diabetes (diabetes mellitus) can lead to health problems over time, including skin problems. People with diabetes have a higher risk for many types of skin complications. This is because having poorly controlled blood sugar (glucose) levels can:  Damage nerves and blood vessels. This can result in decreased feeling in your legs and feet, which means you may not notice minor skin injuries that could lead to serious problems.  Reduce blood flow (circulation), which makes wounds heal more slowly and increases your risk of infection.  Cause areas of skin to become thick or discolored. What are some common skin conditions that affect people with diabetes? Diabetes often causes dry skin. It can also cause the skin on the feet to get  thinner, break more easily, and heal more slowly. There are certain skin conditions that commonly affect people who have diabetes, such as:  Bacterial skin infections, such as styes, boils, infected hair follicles, and infections of the skin around the nails.  Fungal skin infections. These are most common in areas where skin rubs together, such as in the armpits or under the breasts.  Open sores, especially on the feet.  Tissue death (gangrene). This can happen on your feet if a serious infection does not heal properly. Gangrene can cause the need for a foot or leg to be surgically removed (amputated). Diabetes can also cause the skin to change. You may develop:  Dark, velvety markings on the skin that usually appear on the face, neck, armpits, inner thighs, and groin (acanthosis nigricans). This typically affects people of African-American and American-Indian descent.  Red, raised, scar-like tissue that may itch, feel painful, or develop into a wound (necrobiosis lipoidica).  Blisters on feet, toes, hands, or fingers.  Thickened, wax-like areas of skin that usually occur on the hands, forehead, or toes (digital sclerosis).  Brown or red ring-shaped or half-ring-shaped patches of skin on the ears or fingers (disseminated granuloma).  Pea-shaped yellow bumps that may be itchy and surrounded by a red ring (eruptive xanthomatosis). This usually affects the arms, feet, buttocks, and the top of the hands.  Round, discolored patches of tan skin that do not hurt or itch (diabetic dermopathy). These may look like age spots. What do I need to  know about itchy skin? It is common for people with diabetes to have itchy skin caused by dryness. Frequent high blood glucose levels can cause itchiness, and poor circulation and certain skin infections can make dry, itchy skin worse. If you have itchy skin that is red or covered in a rash, this could be a sign of an allergic reaction to a medicine. If you  have a rash or if your skin is very itchy, contact your health care provider. You may need help to manage your diabetes better, or you may need treatment for an infection. How can I prevent skin breakdown? When you have diabetes and you get a badly infected ulcer or sore that does not heal, your skin can break down, especially if you have poor circulation or are on bed rest. To prevent skin breakdown:  Keep your skin clean and dry. Wash your skin often. Do not use hot water.  Do not use any products that contain nicotine or tobacco, such as cigarettes and e-cigarettes. Smoking affects the body's ability to heal. If you need help quitting, ask your health care provider.  Check your skin every day for cuts, bruises, redness, blisters, or sores, especially on your feet. Tell your health care provider about any cuts, wounds, or sores you have, especially if they are healing slowly.  If you are on bed rest, try to change positions often. What else do I need to know about taking care of my skin?   To relieve dry skin and itching:  Limit baths and showers to 5-10 minutes.  Bathe with lukewarm water instead of hot water.  Use mild soap and gentle skin cleansers. Do not use soap that is perfumed or harsh or dries your skin.  Put on lotion as soon as you finish bathing.  Make sure that your health care provider performs a visual foot exam at every medical visit.  Schedule a foot exam with your health care provider once every year. This exam includes an inspection of the structure and skin of your feet.  If you get a skin injury, such as a cut, blister, or sore, check the area every day for signs of infection. Check for:  More redness, swelling, or pain.  More fluid or blood.  Warmth.  Pus or a bad smell. Contact a health care provider if:  You develop a cut or sore, especially on your feet.  You develop signs of infection after a skin injury.  Your blood glucose level is higher than  240 mg/dL (16.1 mmol/L) for 2 days in a row.  You have itchy skin that develops redness or a rash.  You have discolored areas of skin.  You have areas where your skin is changing, such as thickening or appearing shiny. This information is not intended to replace advice given to you by your health care provider. Make sure you discuss any questions you have with your health care provider. Document Released: 10/27/2015 Document Revised: 12/04/2015 Document Reviewed: 10/27/2015 Elsevier Interactive Patient Education  2017 [employer].

## 2016-07-07 LAB — COMPREHENSIVE METABOLIC PANEL
ALT: 26 IU/L (ref 0–44)
AST: 18 IU/L (ref 0–40)
Albumin/Globulin Ratio: 1.4 (ref 1.2–2.2)
Albumin: 4.6 g/dL (ref 3.5–5.5)
Alkaline Phosphatase: 55 IU/L (ref 39–117)
BUN/Creatinine Ratio: 15 (ref 9–20)
BUN: 15 mg/dL (ref 6–24)
Bilirubin Total: 0.2 mg/dL (ref 0.0–1.2)
CO2: 22 mmol/L (ref 18–29)
Calcium: 9.8 mg/dL (ref 8.7–10.2)
Chloride: 94 mmol/L — ABNORMAL LOW (ref 96–106)
Creatinine, Ser: 1 mg/dL (ref 0.76–1.27)
GFR calc Af Amer: 97 mL/min/{1.73_m2} (ref >59)
GFR calc non Af Amer: 84 mL/min/{1.73_m2} (ref >59)
Globulin, Total: 3.4 g/dL (ref 1.5–4.5)
Glucose: 404 mg/dL — ABNORMAL HIGH (ref 65–99)
Potassium: 5.7 mmol/L — ABNORMAL HIGH (ref 3.5–5.2)
Sodium: 137 mmol/L (ref 134–144)
Total Protein: 8 g/dL (ref 6.0–8.5)

## 2016-07-07 LAB — CBC WITH DIFFERENTIAL/PLATELET
Basophils Absolute: 0 10*3/uL (ref 0.0–0.2)
Basos: 0 %
EOS (ABSOLUTE): 0.2 10*3/uL (ref 0.0–0.4)
Eos: 3 %
Hematocrit: 45.8 % (ref 37.5–51.0)
Hemoglobin: 14.9 g/dL (ref 13.0–17.7)
Immature Grans (Abs): 0 10*3/uL (ref 0.0–0.1)
Immature Granulocytes: 0 %
Lymphocytes Absolute: 2.6 10*3/uL (ref 0.7–3.1)
Lymphs: 37 %
MCH: 29.2 pg (ref 26.6–33.0)
MCHC: 32.5 g/dL (ref 31.5–35.7)
MCV: 90 fL (ref 79–97)
Monocytes Absolute: 0.5 10*3/uL (ref 0.1–0.9)
Monocytes: 7 %
Neutrophils Absolute: 3.7 10*3/uL (ref 1.4–7.0)
Neutrophils: 53 %
Platelets: 265 10*3/uL (ref 150–379)
RBC: 5.1 x10E6/uL (ref 4.14–5.80)
RDW: 14.5 % (ref 12.3–15.4)
WBC: 7.1 10*3/uL (ref 3.4–10.8)

## 2016-07-07 LAB — LIPID PANEL
Chol/HDL Ratio: 11.9 ratio units — ABNORMAL HIGH (ref 0.0–5.0)
Cholesterol, Total: 380 mg/dL — ABNORMAL HIGH (ref 100–199)
HDL: 32 mg/dL — ABNORMAL LOW (ref >39)
Triglycerides: 744 mg/dL (ref 0–149)

## 2016-07-07 LAB — PSA: Prostate Specific Ag, Serum: 0.1 ng/mL (ref 0.0–4.0)

## 2016-07-07 LAB — HEMOGLOBIN A1C
Est. average glucose Bld gHb Est-mCnc: 309 mg/dL
Hgb A1c MFr Bld: 12.4 % — ABNORMAL HIGH (ref 4.8–5.6)

## 2016-07-07 LAB — MICROALBUMIN, URINE: Microalbumin, Urine: 6.3 ug/mL

## 2016-07-07 LAB — TSH: TSH: 1.96 u[IU]/mL (ref 0.450–4.500)

## 2016-07-08 ENCOUNTER — Other Ambulatory Visit: Payer: Self-pay

## 2016-07-08 DIAGNOSIS — IMO0002 Reserved for concepts with insufficient information to code with codable children: Secondary | ICD-10-CM

## 2016-07-08 DIAGNOSIS — E1165 Type 2 diabetes mellitus with hyperglycemia: Principal | ICD-10-CM

## 2016-07-08 DIAGNOSIS — E1121 Type 2 diabetes mellitus with diabetic nephropathy: Secondary | ICD-10-CM

## 2016-07-08 DIAGNOSIS — E781 Pure hyperglyceridemia: Secondary | ICD-10-CM

## 2016-07-08 NOTE — Telephone Encounter (Signed)
fax req from [place] is for monitoring device kit with blood glucose monitoring supplies DxCode: E11.21, E11.43  Test blood sugar 1 x daily. Signed by Burnett Harry MD  Fax req re: Chantix, 0.73m - Dr. SNolon Rodwants pt called to tell him to start with 1/2 tablet.  (IC patient) He obtained Chantix from another source than [facility].   Canceled [facility] inquiry to give 56 tabs with return fax.

## 2016-07-08 NOTE — Telephone Encounter (Signed)
[facility] calling in reference to reference number [id number]    States they received the prescription refill for test strips and lancet kit however there is not a quantity or number of refills so they need clarification    Please advise [phone]

## 2016-07-12 MED ORDER — INSULIN GLARGINE 100 UNIT/ML SOLOSTAR PEN: 10.0000 [IU] | PEN_INJECTOR | Freq: Every day | SUBCUTANEOUS | 99 refills | Status: DC

## 2016-07-12 MED ORDER — "PEN NEEDLES 5/16"" 31G X 8 MM MISC": 3 refills | Status: DC

## 2016-07-12 NOTE — Telephone Encounter (Signed)
Please let the pharmacy know that he should get 90 day supply with 3 refills of lancets and test strips.  Also please contact the patient and let that patient know that I left him a voicemail incidating that he shoud be on insulin lantus at bedtime I sent in his pen prescription to [facility] Let him k now that his triglycerides are very high and insulin can lower the triglycerides and sugar.  Let him know that he will need to be on the pills and the insulin.   I would like him to come back to the office in one month.

## 2016-07-13 MED ORDER — FENOFIBRATE 48 MG PO TABS: 48.0000 mg | ORAL_TABLET | Freq: Every day | ORAL | 0 refills | Status: DC

## 2016-07-13 MED ORDER — GLUCOSE BLOOD VI STRP: ORAL_STRIP | 3 refills | Status: DC

## 2016-07-13 MED ORDER — ACCU-CHEK SOFTCLIX LANCET DEV KIT: PACK | 3 refills | Status: DC

## 2016-07-13 MED ORDER — ATORVASTATIN CALCIUM 40 MG PO TABS: 40.0000 mg | ORAL_TABLET | Freq: Every day | ORAL | 0 refills | Status: DC

## 2016-07-13 NOTE — Addendum Note (Signed)
Addended by: Clarene CritchleyKOLLER, KAREN M on: 07/13/2016 09:55 AM   Modules accepted: Orders

## 2016-07-13 NOTE — Telephone Encounter (Signed)
Pharm and pt advised, they will pick up insulin,cholesterol med and testing stuff at [facility] today (also sent to mail order for long term) They finished one rx for bactrim and elbow still draining, has one more rx for bactrim that came from mail order, should he take this next course? (rxd at [facility] and mail order so got 2 courses sent) Has f/u 07/20/16 Please advise

## 2016-07-20 ENCOUNTER — Encounter: Payer: Self-pay | Admitting: Family Medicine

## 2016-07-20 ENCOUNTER — Ambulatory Visit (INDEPENDENT_AMBULATORY_CARE_PROVIDER_SITE_OTHER): Payer: Medicare Other | Admitting: Family Medicine

## 2016-07-20 VITALS — BP 109/74 | HR 75 | Temp 97.8°F | Resp 16 | Ht 76.0 in | Wt 202.2 lb

## 2016-07-20 DIAGNOSIS — E1121 Type 2 diabetes mellitus with diabetic nephropathy: Secondary | ICD-10-CM | POA: Diagnosis not present

## 2016-07-20 DIAGNOSIS — IMO0002 Reserved for concepts with insufficient information to code with codable children: Secondary | ICD-10-CM

## 2016-07-20 DIAGNOSIS — I214 Non-ST elevation (NSTEMI) myocardial infarction: Secondary | ICD-10-CM | POA: Diagnosis not present

## 2016-07-20 DIAGNOSIS — F17200 Nicotine dependence, unspecified, uncomplicated: Secondary | ICD-10-CM

## 2016-07-20 DIAGNOSIS — E1165 Type 2 diabetes mellitus with hyperglycemia: Secondary | ICD-10-CM

## 2016-07-20 MED ORDER — VARENICLINE TARTRATE 1 MG PO TABS: 1.0000 mg | ORAL_TABLET | Freq: Two times a day (BID) | ORAL | 1 refills | Status: AC

## 2016-07-20 MED ORDER — INSULIN GLARGINE 100 UNIT/ML SOLOSTAR PEN: 16.0000 [IU] | PEN_INJECTOR | Freq: Every day | SUBCUTANEOUS | 11 refills | Status: DC

## 2016-07-20 NOTE — Progress Notes (Signed)
Chief Complaint  Patient presents with  . Follow-up    patient indicated that he did not know what he was following up on    HPI   Diabetes Mellitus Type II, Follow-up: Patient here for follow-up of Type 2 diabetes mellitus.  He just started using lantus at bedtime. He took his metformin and glipizide He checks his blood glucoses multiple times a day His fasting sugars are 300s so he has been taking the lantus bid He reports that the lowest sugar he has gotten is 189 but all his readings are 300 or above  He reports that he was previously on the combination metformin/glipizide but stopped it due to having hyperglycemia. He is now taking separate pills of metformin and glipizide.   Is He on ACE inhibitor or angiotensin II receptor blocker?  No, pt bp well controlled on metoprolol.  Adding ace inhibitor and arb would cause hypotension     Past Medical History:  Diagnosis Date  . Asthma   . CHF (congestive heart failure) ([place])   . COPD (chronic obstructive pulmonary disease) ([place])   . Diabetes mellitus   . Guaiac positive stools     Guaiac positive stool with stable H and H.   . Hyperlipidemia   . Hypertension   . NSTEMI (non-ST elevated myocardial infarction) [facility]) April 2012   status post successful PCI and bare-metal  stenting of the first diagonal this admission.   . Tobacco abuse     Current Outpatient Prescriptions  Medication Sig Dispense Refill  . albuterol (PROAIR HFA) 108 (90 Base) MCG/ACT inhaler Inhale 2 puffs into the lungs every 6 (six) hours as needed for wheezing or shortness of breath. 1 Inhaler 2  . aspirin 81 MG tablet Take 1 tablet (81 mg total) by mouth daily as needed. 90 tablet 4  . atorvastatin (LIPITOR) 40 MG tablet Take 1 tablet (40 mg total) by mouth daily. 30 tablet 0  . Blood Glucose Monitoring Suppl (ACCU-CHEK AVIVA PLUS) w/Device KIT Use as directed 1 kit 2  . fenofibrate (TRICOR) 48 MG tablet Take 1 tablet (48 mg total) by mouth daily. 30  tablet 0  . Fluticasone-Salmeterol (ADVAIR DISKUS) 100-50 MCG/DOSE AEPB Inhale 1 puff into the lungs 2 (two) times daily. 180 each 1  . glipiZIDE (GLUCOTROL) 5 MG tablet TAKE 1 TABLET BY MOUTH  DAILY BEFORE BREAKFAST 90 tablet 2  . glucose blood test strip Use to check blood sugar up to 1 times a day before  Meals Dx code- 250.00 100 each 3  . Insulin Glargine (LANTUS SOLOSTAR) 100 UNIT/ML Solostar Pen Inject 16 Units into the skin at bedtime. 5 pen 11  . Insulin Pen Needle (PEN NEEDLES 31GX5/16") 31G X 8 MM MISC Use to give insulin at bedtime. E11.65 30 each 3  . Lancets Misc. (ACCU-CHEK SOFTCLIX LANCET DEV) KIT 100 lancets Use to test blood glucose daily. E11.9 1 kit 3  . metFORMIN (GLUCOPHAGE) 1000 MG tablet Take 1 tablet (1,000 mg total) by mouth 2 (two) times daily with a meal. 180 tablet 3  . metoprolol tartrate (LOPRESSOR) 25 MG tablet TAKE ONE-HALF TABLET BY  MOUTH TWO TIMES DAILY 60 tablet 3  . nitroGLYCERIN (NITROSTAT) 0.4 MG SL tablet DISSOLVE 1 TABLET UNDER THE TONGUE EVERY 5 MINUTES AS  NEEDED FOR CHEST PAIN 100 tablet 0  . pregabalin (LYRICA) 50 MG capsule Take 1 capsule (50 mg total) by mouth 3 (three) times daily. 270 capsule 3  . varenicline (CHANTIX) 0.5 MG tablet  Days 1 to 3: 0.5 mg once daily. Days 4 to 7: 0.5 mg twice daily 10 tablet 0  . varenicline (CHANTIX) 1 MG tablet Take 1 tablet (1 mg total) by mouth 2 (two) times daily. 180 tablet 1   No current facility-administered medications for this visit.     Allergies:  Allergies  Allergen Reactions  . Codeine Nausea And Vomiting    Past Surgical History:  Procedure Laterality Date  . CARDIAC CATHETERIZATION     Normal EF. 90% diagonal lesion  . CORONARY STENT PLACEMENT  April 2012   Diagonal lesion. Bare metal  . UMBILICAL HERNIA REPAIR      Social History   Social History  . Marital status: Divorced    Spouse name: N/A  . Number of children: N/A  . Years of education: 10   Occupational History  . none      Unemployed since 2012   Social History Main Topics  . Smoking status: Current Some Day Smoker    Packs/day: 0.50    Types: Cigarettes  . Smokeless tobacco: Current User    Types: Chew     Comment: Patches do not work.  Cutting back.  Wants Chantix  . Alcohol use No  . Drug use: No  . Sexual activity: Not Asked   Other Topics Concern  . None   Social History Narrative   Patient is intermittently homeless without any income at the moment.           Review of Systems  Constitutional: Negative for chills, diaphoresis, fever, malaise/fatigue and weight loss.  Eyes: Positive for blurred vision and double vision. Negative for photophobia and pain.  Respiratory: Negative for cough, shortness of breath and wheezing.   Cardiovascular: Negative for chest pain, palpitations and leg swelling.  Neurological: Negative for dizziness, tingling and headaches.    Objective: Vitals:   07/20/16 1019  BP: 109/74  Pulse: 75  Resp: 16  Temp: 97.8 F (36.6 C)  TempSrc: Oral  SpO2: 96%  Weight: 202 lb 3.2 oz (91.7 kg)  Height: 6' 4"  (1.93 m)    Physical Exam  Constitutional: He appears well-developed and well-nourished.  HENT:  Head: Normocephalic and atraumatic.  Right Ear: External ear normal.  Left Ear: External ear normal.  Nose: Nose normal.  Mouth/Throat: Oropharynx is clear and moist.  Eyes: Conjunctivae and EOM are normal.  Cardiovascular: Normal rate, regular rhythm and normal heart sounds.   No murmur heard. Pulmonary/Chest: Effort normal and breath sounds normal. No respiratory distress. He has no wheezes.  Musculoskeletal: Normal range of motion. He exhibits no edema.  Skin: Capillary refill takes less than 2 seconds.  Psychiatric: He has a normal mood and affect. His behavior is normal. Judgment and thought content normal.    Assessment and Plan Raequon was seen today for follow-up.  Diagnoses and all orders for this visit:  Uncontrolled type 2 diabetes  mellitus with diabetic nephropathy, without long-term current use of insulin (HCC) -   Discussed that his metoprolol can mask signs of hypoglycemia so he should continue his glucose monitoring Pt was taught using a combination of youtube video, paper and demo how to increase and adjust his insulin -     Insulin Glargine (LANTUS SOLOSTAR) 100 UNIT/ML Solostar Pen; Inject 16 Units into the skin at bedtime.  NSTEMI (non-ST elevated myocardial infarction) [facility])-  Discussed measures for prevention of other MI Continue metoprolol for bp regulation  Tobacco use disorder- pt still interested in quitting Plan  to send to mail order Already sent but quantity was only enough for 30 day supply. -     varenicline (CHANTIX) 1 MG tablet; Take 1 tablet (1 mg total) by mouth 2 (two) times daily.    A total of 30 minutes were spent face-to-face with the patient during this encounter and over half of that time was spent on counseling and coordination of care.     [place]

## 2016-07-20 NOTE — Patient Instructions (Addendum)
Take lantus at bedtime.  Keep increasing your lantus by 2 units at bedtime until your fasting morning sugars are 100-120.     IF you received an x-ray today, you will receive an invoice from [facility]. Please contact [facility] at [phone] with questions or concerns regarding your invoice.   IF you received labwork today, you will receive an invoice from [facility]. Please contact [facility] at [phone] with questions or concerns regarding your invoice.   Our billing staff will not be able to assist you with questions regarding bills from these companies.  You will be contacted with the lab results as soon as they are available. The fastest way to get your results is to activate your My Chart account. Instructions are located on the last page of this paperwork. If you have not heard from [place] regarding the results in 2 weeks, please contact this office.       Insulin Treatment for Diabetes Diabetes (diabetes mellitus) is a long-term (chronic) disease. It occurs when the body does not properly use sugar (glucose) that is released from food after digestion. Glucose levels are controlled by a hormone called insulin, which is made in the pancreas.  If you have type 1 diabetes, the pancreas does not make any insulin, so you must take insulin.  If you have type 2 diabetes, you might need to take insulin along with other medicines. In type 2 diabetes, one or both of these problems may be present:  The pancreas does not make enough insulin.  Cells in the body do not respond properly to insulin that the body makes (insulin resistance). You must use insulin correctly to control your diabetes. You must have some insulin in your body at all times. Insulin treatment varies depending on your type of diabetes, your treatment goals, and your medical history. It is important for you to understand your insulin treatment plan so you can be an active partner in managing your  diabetes. How is insulin given? Insulin can only be given through a shot (injection). It is injected using a syringe and needle, an insulin pen, a pump, or a jet injector. Your health care provider will:  Prescribe the amount and type of insulin that you need.  Tell you when you should inject your insulin. Where on the body should insulin be injected? Insulin is injected into a layer of fatty tissue under the skin. Good places to inject insulin include:  Abdomen. Generally, the abdomen is the best place to inject insulin. However, you should avoid any area that is less than 2 inches (5 cm) from the belly button (navel).  Front and outer area of the upper thighs.  The back of the upper arms.  Upper buttocks. It is important to:  Give your injection in a slightly different place each time. This helps to prevent irritation and improve absorption.  Avoid injecting into areas that have scar tissue. Usually, you will give yourself insulin injections. Others can also be taught how to give you injections. You will use a special type of syringe that is made only for insulin. Some people may have an insulin pump that delivers insulin steadily through a tube (cannula) that is placed under the skin. What are the different types of insulin? The following information is a general guide to different types of insulin. Specifics vary depending on the insulin product that your health care provider prescribes.  Rapid-acting insulin:  Starts working quickly, in as little as 5 minutes.  Can last  for 4-6 hours, or sometimes longer.  Works well when taken right before a meal to quickly lower blood glucose.  Short-acting insulin:  Starts working in about 30 minutes.  Can last for 6-10 hours.  Should be taken about 30 minutes before you start eating a meal.  Intermediate-acting insulin:  Starts working in 1-2 hours.  Lasts for about 10-18 hours.  Lowers your blood glucose for a longer period  of time but is not as effective for lowering blood glucose right after a meal.  Long-acting insulin:  Mimics the small amount of insulin that your pancreas usually produces throughout the day.  Should be used either one or two times a day.  Is usually used in combination with other types of insulin or other medicines.  Concentrated insulin, or U-500 insulin:  Contains a higher dose of insulin than most rapid-acting insulins. U-500 insulin has 5 times the amount of insulin per 1 mL.  Should only be used with the special U-500 syringe or U-500 insulin pen. It is dangerous to use the wrong type of syringe with this insulin. What are the side effects of insulin? Possible side effects of insulin treatment include:  Low blood glucose (hypoglycemia).  Weight gain.  High blood glucose (hyperglycemia).  Skin injury or irritation. Some of these side effects can be caused by using improper injection technique. It is important to learn to inject insulin properly. What are common terms associated with insulin treatment? Some terms that you might hear include:  Basal insulin, or basal rate. This is the constant amount of insulin that needs to be present in your body to stabilize your blood glucose levels. People who have type 1 diabetes need basal insulin in a steady (continuous) dose 24 hours a day.  Usually, intermediate-acting or long-acting insulin is used one or two times a day to manage basal insulin levels.  Medicines that are taken by mouth may also be recommended to manage basal insulin levels.  Prandial insulin. This refers to meal-related insulin.  Blood glucose rises quickly after a meal (postprandial). Rapid-acting or short-acting insulin can be used right before a meal (preprandial) to quickly lower blood glucose.  You may be instructed to adjust the amount of prandial insulin that you take depending on how much carbohydrate (starch) is in your meal.  Corrective insulin. This  may also be called a correction dose or supplemental dose. This is a small amount of rapid-acting or short-acting insulin that can be used to lower blood glucose if it is too high. You may be instructed to check your blood glucose at certain times of the day and use corrective insulin as needed.  Tight control, or intensive therapy. This means keeping your blood glucose as close to your target as possible, and preventing it from getting too high after meals. People who have tight control of their diabetes have fewer long-term problems caused by diabetes. General instructions   Talk with your health care provider or pharmacist about the type of insulin you should take and when you should take it. You should know when your insulin peaks and when it wears off. You need this information so you can plan your meals and exercise. You also need to work with your health care provider to:  Check your blood glucose every day. Your health care provider will tell you how often and when you should do this.  Manage your:  Weight.  Blood pressure.  Cholesterol.  Stress.  Eat a healthy diet.  Exercise  regularly. This information is not intended to replace advice given to you by your health care provider. Make sure you discuss any questions you have with your health care provider. Document Released: 08/12/2008 Document Revised: 10/22/2015 Document Reviewed: 06/19/2015 Elsevier Interactive Patient Education  2017 [place].  Hypoglycemia  Hypoglycemia is when the sugar (glucose) level in the blood is too low. Symptoms of low blood sugar may include:  Feeling:  Hungry.  Worried or nervous (anxious).  Sweaty and clammy.  Confused.  Dizzy.  Sleepy.  Sick to your stomach (nauseous).  Having:  A fast heartbeat.  A headache.  A change in your vision.  Jerky movements that you cannot control (seizure).  Nightmares.  Tingling or no feeling (numbness) around the mouth, lips, or  tongue.  Having trouble with:  Talking.  Paying attention (concentrating).  Moving (coordination).  Sleeping.  Shaking.  Passing out (fainting).  Getting upset easily (irritability). Low blood sugar can happen to people who have diabetes and people who do not have diabetes. Low blood sugar can happen quickly, and it can be an emergency. Treating Low Blood Sugar  Low blood sugar is often treated by eating or drinking something sugary right away. If you can think clearly and swallow safely, follow the 15:15 rule:  Take 15 grams of a fast-acting carb (carbohydrate). Some fast-acting carbs are:  1 tube of glucose gel.  3 sugar tablets (glucose pills).  6-8 pieces of hard candy.  4 oz (120 mL) of fruit juice.  4 oz (120 mL) of regular (not diet) soda.  Check your blood sugar 15 minutes after you take the carb.  If your blood sugar is still at or below 70 mg/dL (3.9 mmol/L), take 15 grams of a carb again.  If your blood sugar does not go above 70 mg/dL (3.9 mmol/L) after 3 tries, get help right away.  After your blood sugar goes back to normal, eat a meal or a snack within 1 hour. Treating Very Low Blood Sugar  If your blood sugar is at or below 54 mg/dL (3 mmol/L), you have very low blood sugar (severe hypoglycemia). This is an emergency. Do not wait to see if the symptoms will go away. Get medical help right away. Call your local emergency services (911 in the [place] Do not drive yourself to the hospital. If you have very low blood sugar and you cannot eat or drink, you may need a glucagon shot (injection). A family member or friend should learn how to check your blood sugar and how to give you a glucagon shot. Ask your doctor if you need to have a glucagon shot kit at home. Follow these instructions at home: General instructions  Avoid any diets that cause you to not eat enough food. Talk with your doctor before you start any new diet.  Take over-the-counter and  prescription medicines only as told by your doctor.  Limit alcohol to no more than 1 drink per day for nonpregnant women and 2 drinks per day for men. One drink equals 12 oz of beer, 5 oz of wine, or 1 oz of hard liquor.  Keep all follow-up visits as told by your doctor. This is important. If You Have Diabetes:   Make sure you know the symptoms of low blood sugar.  Always keep a source of sugar with you, such as:  Sugar.  Sugar tablets.  Glucose gel.  Fruit juice.  Regular soda (not diet soda).  Milk.  Hard candy.  Honey.  Take your medicines as told.  Follow your exercise and meal plan.  Eat on time. Do not skip meals.  Follow your sick day plan when you cannot eat or drink normally. Make this plan ahead of time with your doctor.  Check your blood sugar as often as told by your doctor. Always check before and after exercise.  Share your diabetes care plan with:  Your work or school.  People you live with.  Check your pee (urine) for ketones:  When you are sick.  As told by your doctor.  Carry a card or wear jewelry that says you have diabetes. If You Have Low Blood Sugar From Other Causes:   Check your blood sugar as often as told by your doctor.  Follow instructions from your doctor about what you cannot eat or drink. Contact a doctor if:  You have trouble keeping your blood sugar in your target range.  You have low blood sugar often. Get help right away if:  You still have symptoms after you eat or drink something sugary.  Your blood sugar is at or below 54 mg/dL (3 mmol/L).  You have jerky movements that you cannot control.  You pass out. These symptoms may be an emergency. Do not wait to see if the symptoms will go away. Get medical help right away. Call your local emergency services (911 in the [place] Do not drive yourself to the hospital.  This information is not intended to replace advice given to you by your health care provider. Make  sure you discuss any questions you have with your health care provider. Document Released: 08/10/2009 Document Revised: 10/22/2015 Document Reviewed: 06/19/2015 Elsevier Interactive Patient Education  2017 [employer].

## 2016-08-17 ENCOUNTER — Ambulatory Visit: Payer: Medicare Other | Admitting: Family Medicine

## 2016-08-19 ENCOUNTER — Ambulatory Visit (INDEPENDENT_AMBULATORY_CARE_PROVIDER_SITE_OTHER): Payer: Medicare Other | Admitting: Family Medicine

## 2016-08-19 ENCOUNTER — Ambulatory Visit (INDEPENDENT_AMBULATORY_CARE_PROVIDER_SITE_OTHER): Payer: Medicare Other

## 2016-08-19 VITALS — BP 113/77 | HR 74 | Temp 97.5°F | Resp 16 | Ht 76.0 in | Wt 198.0 lb

## 2016-08-19 DIAGNOSIS — F17200 Nicotine dependence, unspecified, uncomplicated: Secondary | ICD-10-CM

## 2016-08-19 DIAGNOSIS — M2570 Osteophyte, unspecified joint: Secondary | ICD-10-CM

## 2016-08-19 DIAGNOSIS — M25522 Pain in left elbow: Secondary | ICD-10-CM | POA: Diagnosis not present

## 2016-08-19 DIAGNOSIS — M7022 Olecranon bursitis, left elbow: Secondary | ICD-10-CM

## 2016-08-19 DIAGNOSIS — E1165 Type 2 diabetes mellitus with hyperglycemia: Secondary | ICD-10-CM

## 2016-08-19 DIAGNOSIS — S51012S Laceration without foreign body of left elbow, sequela: Secondary | ICD-10-CM

## 2016-08-19 DIAGNOSIS — R739 Hyperglycemia, unspecified: Secondary | ICD-10-CM

## 2016-08-19 DIAGNOSIS — M7989 Other specified soft tissue disorders: Secondary | ICD-10-CM | POA: Diagnosis not present

## 2016-08-19 DIAGNOSIS — E1121 Type 2 diabetes mellitus with diabetic nephropathy: Secondary | ICD-10-CM

## 2016-08-19 DIAGNOSIS — M25729 Osteophyte, unspecified elbow: Secondary | ICD-10-CM

## 2016-08-19 DIAGNOSIS — IMO0002 Reserved for concepts with insufficient information to code with codable children: Secondary | ICD-10-CM

## 2016-08-19 LAB — POCT GLYCOSYLATED HEMOGLOBIN (HGB A1C): Hemoglobin A1C: 10.8

## 2016-08-19 NOTE — Patient Instructions (Addendum)
  Adjust your lantus to get your fasting glucose to 100-160 in the morning If you are having high blood sugars increase your lantus by 2 units until you get the right readings Return in 6 weeks   IF you received an x-ray today, you will receive an invoice from [facility]. Please contact [facility] at [phone] with questions or concerns regarding your invoice.   IF you received labwork today, you will receive an invoice from [facility]. Please contact [facility] at [phone] with questions or concerns regarding your invoice.   Our billing staff will not be able to assist you with questions regarding bills from these companies.  You will be contacted with the lab results as soon as they are available. The fastest way to get your results is to activate your My Chart account. Instructions are located on the last page of this paperwork. If you have not heard from us regarding the results in 2 weeks, please contact this office.

## 2016-08-19 NOTE — Progress Notes (Signed)
Chief Complaint  Patient presents with  . Follow-up    Diabetes, Heart disease  . Knee Pain    Right knee. Pt fell    HPI   Diabetes Mellitus Type II, Follow-up: Patient here for follow-up of Type 2 diabetes mellitus.  Current symptoms/problems include hyperglycemia and have been improving. Symptoms have been present for a few months.  He is doing lantus 16 units at bedtime He reports that he typically gets readings that are very variable between 180s fasting and 300s. He reports that he has not had any low readings.   Known diabetic complications: none Cardiovascular risk factors: diabetes mellitus, dyslipidemia, male gender and smoking/ tobacco exposure Current diabetic medications include  Insulin, metformin and glipizide  Eye exam current (within one year): no Weight trend: decreasing steadily Wt Readings from Last 3 Encounters:  08/19/16 198 lb (89.8 kg)  07/20/16 202 lb 3.2 oz (91.7 kg)  07/06/16 208 lb 6.4 oz (94.5 kg)   Prior visit with dietician: no, patient declined Current diet: in general, a "healthy" diet  , diabetic Current exercise: none  Current monitoring regimen: home blood tests - daily Home blood sugar records: see above Any episodes of hypoglycemia? no  Is He on ACE inhibitor or angiotensin II receptor blocker?  Not Indicated, pt bp runs low    Lab Results  Component Value Date   HGBA1C 10.8 08/19/2016  Last a1c 12.4 07/06/16   Elbow Lesion He continues to have a left elbow erythema and edema with a small partial healed wound States that occasionally there is clear fluid He still gets some pain with flexion No increased swelling or irritation He reports that reports that he he is concerned because it has not resolved in 6 months although it has gotten better.    Smoking Reports that he has been smoking since age 56 He states that he was smoking 2 packs a day for 40 years (80 pack year) and recently cut down to 5-10 cigs a day. He reports  that chantix has been helping him keep his smoking down to 5 cigarettes or less a day. He does not want to get the CT lung cancer screening at this time. He wants to quit smoking first.   Past Medical History:  Diagnosis Date  . Asthma   . CHF (congestive heart failure) ([place])   . COPD (chronic obstructive pulmonary disease) ([place])   . Diabetes mellitus   . Guaiac positive stools     Guaiac positive stool with stable H and H.   . Hyperlipidemia   . Hypertension   . NSTEMI (non-ST elevated myocardial infarction) [facility]) April 2012   status post successful PCI and bare-metal  stenting of the first diagonal this admission.   . Tobacco abuse     Current Outpatient Prescriptions  Medication Sig Dispense Refill  . albuterol (PROAIR HFA) 108 (90 Base) MCG/ACT inhaler Inhale 2 puffs into the lungs every 6 (six) hours as needed for wheezing or shortness of breath. 1 Inhaler 2  . aspirin 81 MG tablet Take 1 tablet (81 mg total) by mouth daily as needed. 90 tablet 4  . atorvastatin (LIPITOR) 40 MG tablet Take 1 tablet (40 mg total) by mouth daily. 30 tablet 0  . Blood Glucose Monitoring Suppl (ACCU-CHEK AVIVA PLUS) w/Device KIT Use as directed 1 kit 2  . fenofibrate (TRICOR) 48 MG tablet Take 1 tablet (48 mg total) by mouth daily. 30 tablet 0  . Fluticasone-Salmeterol (ADVAIR DISKUS) 100-50 MCG/DOSE AEPB  Inhale 1 puff into the lungs 2 (two) times daily. 180 each 1  . glipiZIDE (GLUCOTROL) 5 MG tablet TAKE 1 TABLET BY MOUTH  DAILY BEFORE BREAKFAST 90 tablet 2  . glucose blood test strip Use to check blood sugar up to 1 times a day before  Meals Dx code- 250.00 100 each 3  . Insulin Glargine (LANTUS SOLOSTAR) 100 UNIT/ML Solostar Pen Inject 16 Units into the skin at bedtime. 5 pen 11  . Insulin Pen Needle (PEN NEEDLES 31GX5/16") 31G X 8 MM MISC Use to give insulin at bedtime. E11.65 30 each 3  . Lancets Misc. (ACCU-CHEK SOFTCLIX LANCET DEV) KIT 100 lancets Use to test blood glucose daily. E11.9 1 kit 3    . metFORMIN (GLUCOPHAGE) 1000 MG tablet Take 1 tablet (1,000 mg total) by mouth 2 (two) times daily with a meal. 180 tablet 3  . metoprolol tartrate (LOPRESSOR) 25 MG tablet TAKE ONE-HALF TABLET BY  MOUTH TWO TIMES DAILY 60 tablet 3  . nitroGLYCERIN (NITROSTAT) 0.4 MG SL tablet DISSOLVE 1 TABLET UNDER THE TONGUE EVERY 5 MINUTES AS  NEEDED FOR CHEST PAIN 100 tablet 0  . pregabalin (LYRICA) 50 MG capsule Take 1 capsule (50 mg total) by mouth 3 (three) times daily. 270 capsule 3  . varenicline (CHANTIX) 0.5 MG tablet Days 1 to 3: 0.5 mg once daily. Days 4 to 7: 0.5 mg twice daily 10 tablet 0  . varenicline (CHANTIX) 1 MG tablet Take 1 tablet (1 mg total) by mouth 2 (two) times daily. 180 tablet 1   No current facility-administered medications for this visit.     Allergies:  Allergies  Allergen Reactions  . Codeine Nausea And Vomiting    Past Surgical History:  Procedure Laterality Date  . CARDIAC CATHETERIZATION     Normal EF. 90% diagonal lesion  . CORONARY STENT PLACEMENT  April 2012   Diagonal lesion. Bare metal  . UMBILICAL HERNIA REPAIR      Social History   Social History  . Marital status: Divorced    Spouse name: N/A  . Number of children: N/A  . Years of education: 10   Occupational History  . none     Unemployed since 2012   Social History Main Topics  . Smoking status: Current Some Day Smoker    Packs/day: 0.50    Types: Cigarettes  . Smokeless tobacco: Current User    Types: Chew     Comment: Patches do not work.  Cutting back.  Wants Chantix  . Alcohol use No  . Drug use: No  . Sexual activity: Not Asked   Other Topics Concern  . None   Social History Narrative   Patient is intermittently homeless without any income at the moment.           ROS See hpi  Objective: Vitals:   08/19/16 1033  BP: 113/77  Pulse: 74  Resp: 16  Temp: 97.5 F (36.4 C)  TempSrc: Oral  SpO2: 98%  Weight: 198 lb (89.8 kg)  Height: 6' 4"  (1.93 m)    Physical  Exam  Constitutional: He is oriented to person, place, and time. He appears well-developed and well-nourished.  HENT:  Head: Normocephalic and atraumatic.  Right Ear: External ear normal.  Left Ear: External ear normal.  Nose: Nose normal.  Mouth/Throat: Oropharynx is clear and moist.  Eyes: Conjunctivae and EOM are normal.  Cardiovascular: Normal rate, regular rhythm, normal heart sounds and intact distal pulses.   No murmur  heard. Pulmonary/Chest: Effort normal and breath sounds normal. No respiratory distress. He has no wheezes. He has no rales.  Musculoskeletal: Normal range of motion.  Left elbow with olecranon process with erythema, small pore with granulation tissue, no exudative drainage  Neurological: He is alert and oriented to person, place, and time.   COMPARISON:  November 18, 2008  FINDINGS: Frontal, lateral, and bilateral oblique views were obtained. There is no fracture or dislocation. No joint effusion.  There is marked soft tissue swelling in the olecranon bursa region with a questionable small focus of air in this area region. There is a prominent olecranon spur. There is a smaller coracoid process spur arising from the proximal ulna. No erosion or appreciable joint space narrowing.  IMPRESSION: Olecranon bursa soft tissue swelling with questionable small focus of air. Infection in this bursa must be of concern. No bony destruction. Prominent olecranon process spur with a smaller coracoid process spur. No joint space narrowing or erosion. No fracture or dislocation.  Electronically Signed   By: Lowella Grip III M.D.   On: 08/19/2016 11:49  Assessment and Plan Sava was seen today for follow-up and knee pain.  Diagnoses and all orders for this visit:  Hyperglycemia- discussed with patient that as his diet improves his sugars will improve His a1c is already down by 2 points in one month -     POCT glycosylated hemoglobin (Hb  A1C)  Uncontrolled type 2 diabetes mellitus with diabetic nephropathy, without long-term current use of insulin ([place])- improving with last a1c  Elbow laceration, left, sequela- clinically pt has chronic bursitis with a poorly healing wound Referral to orthopedics as pt may need surgical debridement to aid healing Pt afebrile and wbc have been normal Discussed that diabetes and smoking delay healing -     DG ELBOW COMPLETE LEFT (3+VIEW); Future -     Ambulatory referral to Orthopedic Surgery  Tobacco use disorder- continue chantix and smoking abstinence Discussed usage patterns and support systems  Olecranon bone spur- follow up with Ortho -     Ambulatory referral to Orthopedic Surgery  Olecranon bursitis of left elbow -     Ambulatory referral to Orthopedic Surgery   A total of 40 minutes were spent face-to-face with the patient during this encounter and over half of that time was spent on counseling and coordination of care.   [place]

## 2016-09-08 ENCOUNTER — Other Ambulatory Visit (INDEPENDENT_AMBULATORY_CARE_PROVIDER_SITE_OTHER): Payer: Self-pay | Admitting: Orthopedic Surgery

## 2016-09-08 ENCOUNTER — Encounter (INDEPENDENT_AMBULATORY_CARE_PROVIDER_SITE_OTHER): Payer: Self-pay | Admitting: Orthopedic Surgery

## 2016-09-08 ENCOUNTER — Ambulatory Visit (INDEPENDENT_AMBULATORY_CARE_PROVIDER_SITE_OTHER): Payer: Medicare Other | Admitting: Orthopedic Surgery

## 2016-09-08 DIAGNOSIS — M25422 Effusion, left elbow: Secondary | ICD-10-CM

## 2016-09-08 DIAGNOSIS — M71122 Other infective bursitis, left elbow: Secondary | ICD-10-CM

## 2016-09-08 HISTORY — DX: Effusion, left elbow: M25.422

## 2016-09-09 NOTE — Progress Notes (Signed)
Office Visit Note   Patient: [name] Newton           Date of Birth: 07-06-1960           MRN: [id number] Visit Date: 09/08/2016 Requested by: Deneise Lever, MD [address] [place], [place] [address] PCP: Deneise Lever, MD  Subjective: Chief Complaint  Patient presents with  . Left Elbow - Injury  HPI: [name] Newton is a 56 year old patient with left elbow draining olecranon bursa.  He fell on it a year ago.  Patient states it has not gotten any better.  He is taking antibiotics without relief.  He denies any fevers.  He is right-hand dominant and has diabetes.  Radial grafts on the computer show no definite osteomyelitis but there is a olecranon spur present.  No elbow effusion noted.                ROS: All systems reviewed are negative as they relate to the chief complaint within the history of present illness.  Patient denies  fevers or chills.   Assessment & Plan: Visit Diagnoses:  1. 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  I think bursal excision would be his best option at this time.  It had a year to heal but really has not unable to do that yet.  No evidence of bony osteomyelitis at this time.  Bursa excision combined with some placement of vancomycin powder and loose closure should allow for healing.  Risks and benefits of surgery discussed including not limited to infection elbow stiffness as well as the possible need for more surgery.  All questions answered.  Follow-Up Instructions: No Follow-up on file.   Orders:  No orders of the defined types were placed in this encounter.  No orders of the defined types were placed in this encounter.     Procedures: No procedures performed   Clinical Data: No additional findings.  Objective: Vital Signs: There were no vitals taken for this visit.  Physical Exam:    Constitutional: Patient appears well-developed HEENT:  Head: Normocephalic Eyes:EOM are normal Neck: Normal range of motion Cardiovascular: Normal rate Pulmonary/chest: Effort normal Neurologic: Patient is alert Skin: Skin is warm Psychiatric: Patient has normal mood and affect    Ortho Exam: Orthopedic exam demonstrates full active and passive range of motion of the left elbow.  Boggy synovitis about the size of half a ping-pong ball was present on the olecranon tip.  There is no elbow effusion.  Pronation supination range of motion is intact.  Motor sensory function to the hand is intact.  No proximal lymphadenopathy is present.  There is a draining fistula from the olecranon tip centered over the boggy bursa.  Specialty Comments:  No specialty comments available.  Imaging: No results found.   PMFS History: Patient Active Problem List   Diagnosis Date Noted  . Effusion of bursa of elbow, left 09/08/2016  . Diabetic autonomic neuropathy associated with type 2 diabetes mellitus (HCC) 07/06/2016  . Dyslipidemia 07/06/2016  . Rash 02/15/2016  . Stable angina (HCC) 02/15/2016  . Hyperlipidemia 11/23/2015  . Essential hypertension 07/26/2015  . Erectile dysfunction 07/26/2015  . Healthcare maintenance 07/15/2014  . GERD (gastroesophageal reflux disease) 07/24/2013  . Tobacco abuse 12/03/2012  . Fatty infiltration of liver 07/18/2012  . COPD (chronic obstructive pulmonary disease) (HCC) 07/04/2012  . Hypertriglyceridemia   .  Coronary artery disease   . NSTEMI (non-ST elevated myocardial infarction) (HCC) 08/29/2010  . Diabetes type 2, uncontrolled with neuropathy 05/30/2010   Past Medical History:  Diagnosis Date  . Asthma   . CHF (congestive heart failure) (HCC)   . COPD (chronic obstructive pulmonary disease) (HCC)   . Diabetes mellitus   . Guaiac positive stools     Guaiac positive stool with stable H and H.   . Hyperlipidemia   . Hypertension   . NSTEMI (non-ST  elevated myocardial infarction) [facility]) April 2012   status post successful PCI and bare-metal  stenting of the first diagonal this admission.   . Tobacco abuse     Family History  Problem Relation Age of Onset  . Heart attack Father   . Coronary artery disease Brother   . Heart attack Brother     Past Surgical History:  Procedure Laterality Date  . CARDIAC CATHETERIZATION     Normal EF. 90% diagonal lesion  . CORONARY STENT PLACEMENT  April 2012   Diagonal lesion. Bare metal  . UMBILICAL HERNIA REPAIR     Social History   Occupational History  . none     Unemployed since 2012   Social History Main Topics  . Smoking status: Current Some Day Smoker    Packs/day: 0.50    Types: Cigarettes  . Smokeless tobacco: Current User    Types: Chew     Comment: Patches do not work.  Cutting back.  Wants Chantix  . Alcohol use No  . Drug use: No  . Sexual activity: Not on file

## 2016-09-15 NOTE — Pre-Procedure Instructions (Signed)
Kedar Sedano [facility]  09/15/2016      [facility] SERVICE - [place], [place] - [address] [facility] [address] [place] [address] [place] [address] Phone: [phone] Fax: [phone]  [facility] - [place], [place] - [address] [facility] [place] [address] Phone: [phone] Fax: [phone]    Your procedure is scheduled on April 25 at 405 PM.  Report to [facility] Admitting at 205 PM.  Call this number if you have problems the morning of surgery:  [phone]   Remember:  Do not eat food or drink liquids after midnight.  Take these medicines the morning of surgery with A SIP OF WATER fluticasone-salmeterol (Advair diskus), metoprolol (lopressor).  Take all other medications as prescribed except 7 days prior to surgery STOP taking any Aspirin, Aleve, Naproxen, Ibuprofen, Motrin, Advil, Goody's, BC's, all herbal medications, fish oil, and all vitamins   Do not wear jewelry, make-up or nail polish.  Do not wear lotions, powders, or perfumes, or deoderant.  Do not shave 48 hours prior to surgery.  Men may shave face and neck.  Do not bring valuables to the hospital.  [facility] is not responsible for any belongings or valuables.  Contacts, dentures or bridgework may not be worn into surgery.  Leave your suitcase in the car.  After surgery it may be brought to your room.  For patients admitted to the hospital, discharge time will be determined by your treatment team.  Patients discharged the day of surgery will not be allowed to drive home.   Special instructions:   [place]- Preparing For Surgery  Before surgery, you can play an important role. Because skin is not sterile, your skin needs to be as free of germs as possible. You can reduce the number of germs on your skin by washing with CHG (chlorahexidine gluconate) Soap before surgery.  CHG is an antiseptic cleaner which kills germs and bonds with  the skin to continue killing germs even after washing.  Please do not use if you have an allergy to CHG or antibacterial soaps. If your skin becomes reddened/irritated stop using the CHG.  Do not shave (including legs and underarms) for at least 48 hours prior to first CHG shower. It is OK to shave your face.  Please follow these instructions carefully.   1. Shower the NIGHT BEFORE SURGERY and the MORNING OF SURGERY with CHG.   2. If you chose to wash your hair, wash your hair first as usual with your normal shampoo.  3. After you shampoo, rinse your hair and body thoroughly to remove the shampoo.  4. Use CHG as you would any other liquid soap. You can apply CHG directly to the skin and wash gently with a scrungie or a clean washcloth.   5. Apply the CHG Soap to your body ONLY FROM THE NECK DOWN.  Do not use on open wounds or open sores. Avoid contact with your eyes, ears, mouth and genitals (private parts). Wash genitals (private parts) with your normal soap.  6. Wash thoroughly, paying special attention to the area where your surgery will be performed.  7. Thoroughly rinse your body with warm water from the neck down.  8. DO NOT shower/wash with your normal soap after using and rinsing off the CHG Soap.  9. Pat yourself dry with a CLEAN TOWEL.   10. Wear CLEAN PAJAMAS   11. Place CLEAN SHEETS on your bed the  night of your first shower and DO NOT SLEEP WITH PETS. 12.  Day of Surgery: Do not apply any deodorants/lotions. Please wear clean clothes to the hospital/surgery center.     How to Manage Your Diabetes Before and After Surgery  Why is it important to control my blood sugar before and after surgery? . Improving blood sugar levels before and after surgery helps healing and can limit problems. . A way of improving blood sugar control is eating a healthy diet by: o  Eating less sugar and carbohydrates o  Increasing activity/exercise o  Talking with your doctor about  reaching your blood sugar goals . High blood sugars (greater than 180 mg/dL) can raise your risk of infections and slow your recovery, so you will need to focus on controlling your diabetes during the weeks before surgery. . Make sure that the doctor who takes care of your diabetes knows about your planned surgery including the date and location.  How do I manage my blood sugar before surgery? . Check your blood sugar at least 4 times a day, starting 2 days before surgery, to make sure that the level is not too high or low. o Check your blood sugar the morning of your surgery when you wake up and every 2 hours until you get to the Short Stay unit. . If your blood sugar is less than 70 mg/dL, you will need to treat for low blood sugar: o Do not take insulin. o Treat a low blood sugar (less than 70 mg/dL) with  cup of clear juice (cranberry or apple), 4 glucose tablets, OR glucose gel. o Recheck blood sugar in 15 minutes after treatment (to make sure it is greater than 70 mg/dL). If your blood sugar is not greater than 70 mg/dL on recheck, call [phone] for further instructions. . Report your blood sugar to the short stay nurse when you get to Short Stay.  . If you are admitted to the hospital after surgery: o Your blood sugar will be checked by the staff and you will probably be given insulin after surgery (instead of oral diabetes medicines) to make sure you have good blood sugar levels. o The goal for blood sugar control after surgery is 80-180 mg/dL.       WHAT DO I DO ABOUT MY DIABETES MEDICATION?   Marland Kitchen Do not take oral diabetes medicines (pills) the morning of surgery.  . THE NIGHT BEFORE SURGERY, take 8 units of lantus insulin.       . The day of surgery, do not take other diabetes injectables, including Byetta (exenatide), Bydureon (exenatide ER), Victoza (liraglutide), or Trulicity (dulaglutide).  . If your CBG is greater than 220 mg/dL, you may take  of your sliding scale  (correction) dose of insulin.  Reviewed and Endorsed by [facility] Patient Education Committee, August 2015      Please read over the following fact sheets that you were given. Pain Booklet, Coughing and Deep Breathing, MRSA Information and Surgical Site Infection Prevention

## 2016-09-16 ENCOUNTER — Encounter (HOSPITAL_COMMUNITY)
Admission: RE | Admit: 2016-09-16 | Discharge: 2016-09-16 | Disposition: A | Discharge status: Home / Self Care | Payer: Medicare Other | Source: Ambulatory Visit | Attending: Orthopedic Surgery | Admitting: Orthopedic Surgery

## 2016-09-16 ENCOUNTER — Encounter (HOSPITAL_COMMUNITY): Payer: Self-pay

## 2016-09-16 DIAGNOSIS — Z01818 Encounter for other preprocedural examination: Secondary | ICD-10-CM | POA: Diagnosis not present

## 2016-09-16 DIAGNOSIS — T8859XA Other complications of anesthesia, initial encounter: Secondary | ICD-10-CM | POA: Insufficient documentation

## 2016-09-16 DIAGNOSIS — M71122 Other infective bursitis, left elbow: Secondary | ICD-10-CM | POA: Insufficient documentation

## 2016-09-16 HISTORY — DX: Unspecified osteoarthritis, unspecified site: M19.90

## 2016-09-16 HISTORY — DX: Adverse effect of unspecified anesthetic, initial encounter: T41.45XA

## 2016-09-16 HISTORY — DX: Other complications of anesthesia, initial encounter: T88.59XA

## 2016-09-16 LAB — CBC
HCT: 41.7 % (ref 39.0–52.0)
Hemoglobin: 13.5 g/dL (ref 13.0–17.0)
MCH: 28.8 pg (ref 26.0–34.0)
MCHC: 32.4 g/dL (ref 30.0–36.0)
MCV: 89.1 fL (ref 78.0–100.0)
Platelets: 287 10*3/uL (ref 150–400)
RBC: 4.68 MIL/uL (ref 4.22–5.81)
RDW: 14.8 % (ref 11.5–15.5)
WBC: 7.9 10*3/uL (ref 4.0–10.5)

## 2016-09-16 LAB — BASIC METABOLIC PANEL
Anion gap: 9 (ref 5–15)
BUN: 10 mg/dL (ref 6–20)
CO2: 24 mmol/L (ref 22–32)
Calcium: 9.5 mg/dL (ref 8.9–10.3)
Chloride: 103 mmol/L (ref 101–111)
Creatinine, Ser: 0.86 mg/dL (ref 0.61–1.24)
GFR calc Af Amer: >60 mL/min (ref >60)
GFR calc non Af Amer: >60 mL/min (ref >60)
Glucose, Bld: 262 mg/dL — ABNORMAL HIGH (ref 65–99)
Potassium: 4.5 mmol/L (ref 3.5–5.1)
Sodium: 136 mmol/L (ref 135–145)

## 2016-09-16 LAB — GLUCOSE, CAPILLARY: Glucose-Capillary: 275 mg/dL — ABNORMAL HIGH (ref 65–99)

## 2016-09-16 NOTE — Progress Notes (Addendum)
ZOX:WRUEA [name] Bridge, MD  Cardiologist: Dr. Dow Adolph  EKG: pt denies past year  Stress test:03/2015 in EPIC  ECHO: pt denies  Cardiac Cath: 2012 at [facility]  Chest x-ray: pt denies

## 2016-09-17 LAB — HEMOGLOBIN A1C
Hgb A1c MFr Bld: 10.2 % — ABNORMAL HIGH (ref 4.8–5.6)
Mean Plasma Glucose: 246 mg/dL

## 2016-09-19 MED ORDER — CEFAZOLIN SODIUM-DEXTROSE 2-4 GM/100ML-% IV SOLN
2.0000 g | INTRAVENOUS | Status: AC
  Administered 2016-09-20: 2 g via INTRAVENOUS
  Filled 2016-09-19: qty 100

## 2016-09-19 NOTE — Progress Notes (Signed)
Anesthesia chart review: Patient is a 56 year old male scheduled for exercise olecranon bursa on 09/20/16 by Dr. August Saucer. He fell last year injuring his left elbow. It has been draining and has not gotten any better despite antibiotic therapy. Anesthesia is posted as Choice.  History includes smoking, diabetes mellitus type 2, NSTEMI s/p BMS D1 09/23/10, HTN, HLD, asthma, CHF, COPD, arthritis, umbilical hernia repair, closed reduction shoulder dislocation '98.   For anesthesia complications, PAT RN wrote that at the time of his shoulder dislocation in 1998, he was told not to be put under again because he was very difficult to awake from anesthesia. (I was not consulted to speak with him during his PAT visit.) I called and spoke with patient. He reported that his shoulder became dislocated while lifting weights (attempted 400 lb). He is not clear on all of the details (due to being sedated), but he thinks that he received IV sedation in the ED at least 2 or 3 times but were still unable to make him comfortable enough to reduce his shoulder. He then was given medication to make him "in a coma" to successfully reduce his shoulder. Doctors subsequently had a difficult time waking him. At first he told me they "shocked" him and "almost pulled the plug", but later told me he wasn't sure. He remembers being told to "never let anyone put you asleep again." This was at [facility] in [place]. He reports he was discharged home later that day. He denied any family history of anesthesia reactions. His sister had to have brain surgery for an aneurysm. His brother had two stents and father died from a "widow maker."   In regards to his DM, he was checking his CBGs 3-4 times per day, but doesn't have enough strips to do this, so is now only checking periodically. He insurance coverage is decreasing next month. He has had glucose readings ranging from 80-600, but primarily in the 200 range. His A1c has dropped  from 12 to 10 in the last few months. In regards to his CAD history, he reports chronic chest pain with stress, working outside, going out in cold weather, when he's dehydrated. He takes one Nitro about 1-2 times per month. He has been having chest pains like this for years and has not really noticed any change. He has refused ED evaluation on at least a few occasions after instruction from [facility] staff. He has intermittent SOB. He uses his inhalers at least once daily. At the time of his MI, he had been evaluated weeks before for chest pain at [facility] and discharged home, but later had persistent chest pain despite 3 Nitro and came to [facility] where he underwent cardiac cath and BMS for 1V CAD '12.   - PCP is listed as Dr. Deneise Lever ([facility]); however, he is now seeing Dr. Collie Siad at [facility].  (He left the IM Clinic because he felt they were not addressing his elbow issues adequately.) - Cardiologist is Dr. Kristeen Miss, last visit 04/02/15 after a three year absence. He had reported episodes of chest pain with mental stress and had taken Nitro on occasion. His symptoms were felt atypical for angina, but he was wanting to start sildenafil PRN, so a stress test was ordered which was normal.    Meds include albuterol, aspirin 81 mg, BC headache powder, Lipitor, TriCor, Advair, Lopid, glipizide, Lantus, Lopressor, nitroglycerin, Lyrica, Chantix.  BP 119/74   Pulse 84  Temp 36.5 C   Resp 20   Ht  (1.93 m)   Wt 200 lb 4.8 oz (90.9 kg)   SpO2 99%   BMI 24.38 kg/m    EKG 09/16/16: SR. Baseline artifact precludes adequate analysis. (This is most apparent in V4-6.). He will   Nuclear stress test 04/06/15:   Nuclear stress EF: 62%.  The left ventricular ejection fraction is normal (55-65%).  The Myocardial perfusion is normal. The study is normal.  This is a low risk study.  There was no ST segment deviation noted during stress.   Cardiac cath 09/23/10 (Dr. Lorine Bears): LM: Normal in size with 10-20% distal stenosis at the bifurcation. LAD: Normal in size with mild calcifications. The vessel tapers in size significantly after giving first large diagonal branch.  In the mid LAD, there is a diffuse 30% disease.  The distal LAD has minor irregularities.  The first diagonal branches is a very large-sized branch and actually supplies most of the high lateral wall.  It almost functions as a dual LAD system.  A 90% stenosis is noted proximally at the bifurcation of a medium-sized superior branch.  The vessel then gives two distal branches, which are free of significant disease. LCX: Norma in size. Nondominant. 20% mid stenosis after OM1. OM1 20% mid stenosis (small vessel). OM2 and OM3 are normal sized branches and free of significant disease.  RCA: Very large in size. Dominant. 20% mid stenosis (tubular). RPDA is large with 20% ostial stenosis. PLA1 (normal) and PLA2 (small) are free of significant disease. STUDY CONCLUSIONS: 1. Severe single-vessel coronary artery disease. 2. Normal LV systolic function. Estimated EF 60% with mild anterolateral hypokinesis. 3. Successful angioplasty and a bare-metal stent placement to 90% proximal large diagonal branch, which is a bifurcation lesion.  A balloon angioplasty was performed on the superior branch before stent deployment.  There was 0% residual stenosis in the main diagonal branch and TIMI 3 flow after stent placement.  This diagonal branch is very large in size and function as a dual LAD system.  A bare-metal stent was used due to inability to afford long-term dual antiplatelet therapy.  Preoperative labs noted. Cr 0.86. CBC WNL. Glucose 262. A1c 10.2, consistent with average glucose of 246. (Last A1c was 10.8 on 08/19/16 and 12.4 on 07/06/16.)  Reviewed above with anesthesiologist Dr. Michelle Piper including anesthesia concerns, DM control, and CAD history with chronic symptoms since at least 2016 (with normal stress test  during that time). Patient to be further evaluated by his anesthesiologist on the day of surgery. Procedure can hopefully be done without general anesthesia. Patient will get a fasting CBG on arrival. I am also ordering a repeat EKG, due to poor quality tracing at PAT. I have also discussed these issues with Taren at Dr. Diamantina Providence office. She reviewed with Dr. August Saucer. He plans to keep patient as scheduled. Definitive plan following anesthesiologist evaluation tomorrow.   Velna [facility]/Anesthesiology Phone [phone] 09/19/2016 1:43 PM

## 2016-09-20 ENCOUNTER — Encounter (HOSPITAL_COMMUNITY)
Admission: RE | Disposition: A | Discharge status: Home / Self Care | Payer: Self-pay | Source: Ambulatory Visit | Attending: Orthopedic Surgery

## 2016-09-20 ENCOUNTER — Ambulatory Visit (HOSPITAL_COMMUNITY): Payer: Medicare Other | Admitting: Vascular Surgery

## 2016-09-20 ENCOUNTER — Encounter (HOSPITAL_COMMUNITY): Payer: Self-pay | Admitting: *Deleted

## 2016-09-20 ENCOUNTER — Ambulatory Visit (HOSPITAL_COMMUNITY)
Admission: RE | Admit: 2016-09-20 | Discharge: 2016-09-20 | Disposition: A | Discharge status: Home / Self Care | Payer: Medicare Other | Source: Ambulatory Visit | Attending: Orthopedic Surgery | Admitting: Orthopedic Surgery

## 2016-09-20 DIAGNOSIS — Z7951 Long term (current) use of inhaled steroids: Secondary | ICD-10-CM | POA: Insufficient documentation

## 2016-09-20 DIAGNOSIS — M71122 Other infective bursitis, left elbow: Secondary | ICD-10-CM | POA: Diagnosis not present

## 2016-09-20 DIAGNOSIS — I252 Old myocardial infarction: Secondary | ICD-10-CM | POA: Insufficient documentation

## 2016-09-20 DIAGNOSIS — I509 Heart failure, unspecified: Secondary | ICD-10-CM | POA: Insufficient documentation

## 2016-09-20 DIAGNOSIS — I110 Hypertensive heart disease with heart failure: Secondary | ICD-10-CM | POA: Insufficient documentation

## 2016-09-20 DIAGNOSIS — F17210 Nicotine dependence, cigarettes, uncomplicated: Secondary | ICD-10-CM | POA: Diagnosis not present

## 2016-09-20 DIAGNOSIS — Z7982 Long term (current) use of aspirin: Secondary | ICD-10-CM | POA: Diagnosis not present

## 2016-09-20 DIAGNOSIS — E785 Hyperlipidemia, unspecified: Secondary | ICD-10-CM | POA: Diagnosis not present

## 2016-09-20 DIAGNOSIS — J449 Chronic obstructive pulmonary disease, unspecified: Secondary | ICD-10-CM | POA: Insufficient documentation

## 2016-09-20 DIAGNOSIS — E1069 Type 1 diabetes mellitus with other specified complication: Secondary | ICD-10-CM | POA: Insufficient documentation

## 2016-09-20 DIAGNOSIS — Z794 Long term (current) use of insulin: Secondary | ICD-10-CM | POA: Insufficient documentation

## 2016-09-20 DIAGNOSIS — I2510 Atherosclerotic heart disease of native coronary artery without angina pectoris: Secondary | ICD-10-CM | POA: Diagnosis not present

## 2016-09-20 DIAGNOSIS — Z955 Presence of coronary angioplasty implant and graft: Secondary | ICD-10-CM | POA: Diagnosis not present

## 2016-09-20 DIAGNOSIS — I10 Essential (primary) hypertension: Secondary | ICD-10-CM | POA: Diagnosis not present

## 2016-09-20 DIAGNOSIS — Z79899 Other long term (current) drug therapy: Secondary | ICD-10-CM | POA: Diagnosis not present

## 2016-09-20 HISTORY — PX: OLECRANON BURSECTOMY: SHX2097

## 2016-09-20 LAB — GLUCOSE, CAPILLARY
Glucose-Capillary: 134 mg/dL — ABNORMAL HIGH (ref 65–99)
Glucose-Capillary: 117 mg/dL — ABNORMAL HIGH (ref 65–99)

## 2016-09-20 SURGERY — BURSECTOMY, ELBOW
Anesthesia: Monitor Anesthesia Care | Site: Elbow | Laterality: Left

## 2016-09-20 MED ORDER — 0.9 % SODIUM CHLORIDE (POUR BTL) OPTIME
TOPICAL | Status: DC | PRN
  Administered 2016-09-20: 1000 mL

## 2016-09-20 MED ORDER — LACTATED RINGERS IV SOLN
INTRAVENOUS | Status: DC
  Administered 2016-09-20: 16:00:00 via INTRAVENOUS

## 2016-09-20 MED ORDER — ONDANSETRON HCL 4 MG/2ML IJ SOLN
INTRAMUSCULAR | Status: DC | PRN
  Administered 2016-09-20: 4 mg via INTRAVENOUS

## 2016-09-20 MED ORDER — HYDROMORPHONE HCL 1 MG/ML IJ SOLN: 0.2500 mg | INTRAMUSCULAR | Status: DC | PRN

## 2016-09-20 MED ORDER — MIDAZOLAM HCL 2 MG/2ML IJ SOLN
2.0000 mg | Freq: Once | INTRAMUSCULAR | Status: AC
  Administered 2016-09-20: 2 mg via INTRAVENOUS

## 2016-09-20 MED ORDER — MIDAZOLAM HCL 2 MG/2ML IJ SOLN
INTRAMUSCULAR | Status: AC
  Administered 2016-09-20: 2 mg via INTRAVENOUS
  Filled 2016-09-20: qty 2

## 2016-09-20 MED ORDER — PROPOFOL 10 MG/ML IV BOLUS
INTRAVENOUS | Status: AC
  Filled 2016-09-20: qty 20

## 2016-09-20 MED ORDER — LIDOCAINE HCL (CARDIAC) 20 MG/ML IV SOLN
INTRAVENOUS | Status: DC | PRN
  Administered 2016-09-20: 20 mg via INTRATRACHEAL

## 2016-09-20 MED ORDER — PHENYLEPHRINE HCL 10 MG/ML IJ SOLN
INTRAMUSCULAR | Status: DC | PRN
  Administered 2016-09-20 (×2): 40 ug via INTRAVENOUS

## 2016-09-20 MED ORDER — CHLORHEXIDINE GLUCONATE 4 % EX LIQD: 60.0000 mL | Freq: Once | CUTANEOUS | Status: DC

## 2016-09-20 MED ORDER — METOPROLOL TARTRATE 12.5 MG HALF TABLET
12.5000 mg | ORAL_TABLET | Freq: Once | ORAL | Status: AC
  Administered 2016-09-20: 12.5 mg via ORAL

## 2016-09-20 MED ORDER — FENTANYL CITRATE (PF) 100 MCG/2ML IJ SOLN
INTRAMUSCULAR | Status: AC
  Administered 2016-09-20: 50 ug via INTRAVENOUS
  Filled 2016-09-20: qty 2

## 2016-09-20 MED ORDER — FENTANYL CITRATE (PF) 100 MCG/2ML IJ SOLN
50.0000 ug | Freq: Once | INTRAMUSCULAR | Status: AC
  Administered 2016-09-20: 50 ug via INTRAVENOUS

## 2016-09-20 MED ORDER — FENTANYL CITRATE (PF) 250 MCG/5ML IJ SOLN
INTRAMUSCULAR | Status: AC
  Filled 2016-09-20: qty 5

## 2016-09-20 MED ORDER — FENTANYL CITRATE (PF) 100 MCG/2ML IJ SOLN
INTRAMUSCULAR | Status: DC | PRN
  Administered 2016-09-20 (×2): 25 ug via INTRAVENOUS

## 2016-09-20 MED ORDER — VANCOMYCIN HCL 500 MG IV SOLR
INTRAVENOUS | Status: AC
  Filled 2016-09-20: qty 500

## 2016-09-20 MED ORDER — METOPROLOL TARTRATE 12.5 MG HALF TABLET
ORAL_TABLET | ORAL | Status: AC
  Administered 2016-09-20: 12.5 mg via ORAL
  Filled 2016-09-20: qty 1

## 2016-09-20 MED ORDER — VANCOMYCIN HCL 500 MG IV SOLR
INTRAVENOUS | Status: DC | PRN
  Administered 2016-09-20: 500 mg via TOPICAL

## 2016-09-20 MED ORDER — BUPIVACAINE-EPINEPHRINE (PF) 0.5% -1:200000 IJ SOLN
INTRAMUSCULAR | Status: DC | PRN
  Administered 2016-09-20: 30 mL via PERINEURAL

## 2016-09-20 MED ORDER — PROPOFOL 500 MG/50ML IV EMUL
INTRAVENOUS | Status: DC | PRN
  Administered 2016-09-20: 75 ug/kg/min via INTRAVENOUS

## 2016-09-20 SURGICAL SUPPLY — 43 items
BANDAGE ACE 3X5.8 VEL STRL LF (GAUZE/BANDAGES/DRESSINGS) IMPLANT
BANDAGE ACE 4X5 VEL STRL LF (GAUZE/BANDAGES/DRESSINGS) ×1 IMPLANT
BNDG COHESIVE 4X5 TAN STRL (GAUZE/BANDAGES/DRESSINGS) ×1 IMPLANT
BNDG GAUZE ELAST 4 BULKY (GAUZE/BANDAGES/DRESSINGS) IMPLANT
CANISTER SUCT 3000ML PPV (MISCELLANEOUS) ×2 IMPLANT
CONT SPEC 4OZ CLIKSEAL STRL BL (MISCELLANEOUS) ×1 IMPLANT
CORDS BIPOLAR (ELECTRODE) ×2 IMPLANT
COVER SURGICAL LIGHT HANDLE (MISCELLANEOUS) ×2 IMPLANT
CUFF TOURNIQUET SINGLE 18IN (TOURNIQUET CUFF) ×2 IMPLANT
CUFF TOURNIQUET SINGLE 24IN (TOURNIQUET CUFF) IMPLANT
DRAPE SURG 17X23 STRL (DRAPES) ×2 IMPLANT
DRSG ADAPTIC 3X8 NADH LF (GAUZE/BANDAGES/DRESSINGS) IMPLANT
DRSG AQUACEL AG ADV 3.5X 6 (GAUZE/BANDAGES/DRESSINGS) ×1 IMPLANT
GAUZE SPONGE 4X4 12PLY STRL (GAUZE/BANDAGES/DRESSINGS) IMPLANT
GLOVE BIOGEL PI IND STRL 8.5 (GLOVE) ×1 IMPLANT
GLOVE BIOGEL PI INDICATOR 8.5 (GLOVE) ×1
GLOVE SURG ORTHO 8.0 STRL STRW (GLOVE) ×2 IMPLANT
GOWN STRL REUS W/ TWL LRG LVL3 (GOWN DISPOSABLE) ×2 IMPLANT
GOWN STRL REUS W/ TWL XL LVL3 (GOWN DISPOSABLE) ×1 IMPLANT
GOWN STRL REUS W/TWL LRG LVL3 (GOWN DISPOSABLE) ×4
GOWN STRL REUS W/TWL XL LVL3 (GOWN DISPOSABLE) ×2
KIT BASIN OR (CUSTOM PROCEDURE TRAY) ×2 IMPLANT
KIT ROOM TURNOVER OR (KITS) ×2 IMPLANT
NDL HYPO 25GX1X1/2 BEV (NEEDLE) IMPLANT
NEEDLE HYPO 25GX1X1/2 BEV (NEEDLE) IMPLANT
NS IRRIG 1000ML POUR BTL (IV SOLUTION) ×3 IMPLANT
PACK ORTHO EXTREMITY (CUSTOM PROCEDURE TRAY) ×2 IMPLANT
PAD ARMBOARD 7.5X6 YLW CONV (MISCELLANEOUS) ×4 IMPLANT
PAD CAST 4YDX4 CTTN HI CHSV (CAST SUPPLIES) IMPLANT
PADDING CAST COTTON 4X4 STRL (CAST SUPPLIES)
SLING ARM FOAM STRAP LRG (SOFTGOODS) ×1 IMPLANT
SOAP 2 % CHG 4 OZ (WOUND CARE) ×1 IMPLANT
SPECIMEN JAR SMALL (MISCELLANEOUS) ×1 IMPLANT
SUT ETHILON 3 0 PS 1 (SUTURE) ×2 IMPLANT
SUT PROLENE 4 0 PS 2 18 (SUTURE) IMPLANT
SUT VIC AB 2-0 CT1 27 (SUTURE) ×2
SUT VIC AB 2-0 CT1 TAPERPNT 27 (SUTURE) IMPLANT
SYR CONTROL 10ML LL (SYRINGE) IMPLANT
TOWEL OR 17X24 6PK STRL BLUE (TOWEL DISPOSABLE) ×2 IMPLANT
TOWEL OR 17X26 10 PK STRL BLUE (TOWEL DISPOSABLE) ×2 IMPLANT
TUBE CONNECTING 12X1/4 (SUCTIONS) ×1 IMPLANT
UNDERPAD 30X30 (UNDERPADS AND DIAPERS) ×2 IMPLANT
WATER STERILE IRR 1000ML POUR (IV SOLUTION) ×1 IMPLANT

## 2016-09-20 NOTE — Progress Notes (Signed)
Patient states he cannot be put under general anesthesia. Patient states if they are going to use general anesthesia he is leaving and not having surgery. Dr. Sampson Goon, W notified to discuss. Will wait on starting to get patient ready until after he speaks to anesthesia MD.

## 2016-09-20 NOTE — Brief Op Note (Signed)
09/20/2016  5:02 PM  PATIENT:  [name] Newton  56 y.o. male  PRE-OPERATIVE DIAGNOSIS:  LEFT CHRONICALLY INFECTED OLECRANON BURSA  POST-OPERATIVE DIAGNOSIS:  LEFT CHRONICALLY INFECTED OLECRANON BURSA  PROCEDURE:  Procedure(s): EXCISION OLECRANON BURSA  SURGEON:  Surgeon(s): Cammy Copa, MD  ASSISTANT: Patrick Jupiter rnfa  ANESTHESIA:   regional  EBL: 15 ml    Total I/O In: 400 [I.V.:400] Out: 25 [Blood:25]  BLOOD ADMINISTERED: none  DRAINS: none   LOCAL MEDICATIONS USED:  none  SPECIMEN:  Specimen to culture  COUNTS:  YES  TOURNIQUET:   Total Tourniquet Time Documented: Upper Arm (Left) - 10 minutes Total: Upper Arm (Left) - 10 minutes   DICTATION: .Other Dictation: Dictation Number [id number]  PLAN OF CARE: Discharge to home after PACU  PATIENT DISPOSITION:  PACU - hemodynamically stable

## 2016-09-20 NOTE — Transfer of Care (Signed)
Immediate Anesthesia Transfer of Care Note  Patient: [name] Newton  Procedure(s) Performed: Procedure(s): EXCISION OLECRANON BURSA (Left)  Patient Location: PACU  Anesthesia Type:MAC and Regional  Level of Consciousness: awake, alert  and oriented  Airway & Oxygen Therapy: Patient Spontanous Breathing  Post-op Assessment: Report given to RN and Post -op Vital signs reviewed and stable  Post vital signs: Reviewed and stable  Last Vitals:  Vitals:   09/20/16 1543 09/20/16 1655  BP:  114/83  Pulse: 62 65  Resp: 20 (!) 63  Temp:  36.7 C    Last Pain: There were no vitals filed for this visit.       Complications: No apparent anesthesia complications

## 2016-09-20 NOTE — Progress Notes (Signed)
Per Dr. August Saucer verbal order added left to procedure. Initialed by wife.

## 2016-09-20 NOTE — Anesthesia Preprocedure Evaluation (Signed)
Anesthesia Evaluation  Patient identified by MRN, date of birth, ID band Patient awake    Reviewed: Allergy & Precautions, H&P , NPO status , Patient's Chart, lab work & pertinent test results, reviewed documented beta blocker date and time   Airway Mallampati: II  TM Distance: >3 FB Neck ROM: Full    Dental no notable dental hx. (+) Partial Upper, Dental Advisory Given   Pulmonary asthma , COPD,  COPD inhaler, Current Smoker,    Pulmonary exam normal breath sounds clear to auscultation       Cardiovascular hypertension, Pt. on medications and Pt. on home beta blockers + CAD, + Past MI, + Cardiac Stents and +CHF  negative cardio ROS   Rhythm:Regular Rate:Normal     Neuro/Psych negative neurological ROS  negative psych ROS   GI/Hepatic Neg liver ROS, GERD  Medicated and Controlled,  Endo/Other  diabetes, Type 1, Oral Hypoglycemic Agents, Insulin Dependent  Renal/GU negative Renal ROS  negative genitourinary   Musculoskeletal  (+) Arthritis ,   Abdominal   Peds  Hematology negative hematology ROS (+)   Anesthesia Other Findings   Reproductive/Obstetrics negative OB ROS                             Anesthesia Physical Anesthesia Plan  ASA: III  Anesthesia Plan: MAC and Regional   Post-op Pain Management:    Induction: Intravenous  Airway Management Planned: Simple Face Mask  Additional Equipment:   Intra-op Plan:   Post-operative Plan:   Informed Consent: I have reviewed the patients History and Physical, chart, labs and discussed the procedure including the risks, benefits and alternatives for the proposed anesthesia with the patient or authorized representative who has indicated his/her understanding and acceptance.   Dental advisory given  Plan Discussed with: CRNA  Anesthesia Plan Comments:         Anesthesia Quick Evaluation

## 2016-09-20 NOTE — Anesthesia Procedure Notes (Signed)
Anesthesia Regional Block: Supraclavicular block   Pre-Anesthetic Checklist: ,, timeout performed, Correct Patient, Correct Site, Correct Laterality, Correct Procedure, Correct Position, site marked, Risks and benefits discussed, pre-op evaluation,  At surgeon's request and post-op pain management  Laterality: Left  Prep: Maximum Sterile Barrier Precautions used, chloraprep       Needles:  Injection technique: Single-shot  Needle Type: Echogenic Stimulator Needle     Needle Length: 5cm  Needle Gauge: 22     Additional Needles:   Procedures: ultrasound guided,,,,,,,,  Narrative:  Start time: 09/20/2016 2:59 PM End time: 09/20/2016 3:09 PM Injection made incrementally with aspirations every 5 mL. Anesthesiologist: Gaynelle Adu  Additional Notes: 2% Lidocaine skin wheel.

## 2016-09-20 NOTE — Anesthesia Postprocedure Evaluation (Signed)
Anesthesia Post Note  Patient: [name] Newton  Procedure(s) Performed: Procedure(s) (LRB): EXCISION OLECRANON BURSA (Left)  Patient location during evaluation: PACU Anesthesia Type: Regional and MAC Level of consciousness: awake Pain management: pain level controlled Vital Signs Assessment: post-procedure vital signs reviewed and stable Respiratory status: spontaneous breathing Cardiovascular status: stable Anesthetic complications: no       Last Vitals:  Vitals:   09/20/16 1710 09/20/16 1725  BP: 123/86 113/79  Pulse: 61 60  Resp: 17 20  Temp:  36.7 C    Last Pain: There were no vitals filed for this visit.               [name]

## 2016-09-20 NOTE — H&P (Signed)
[name] Newton is an 56 y.o. male.   Chief Complaint: Left elbow infected olecranon bursitis HPI: [name] Newton is a 56 year old patient with about a 1 year history of draining infected olecranon bursitis.  He's had multiple measures of treatment prior to coming to the office last week.  The patient has uncontrolled diabetes with hemoglobin A1c 10.2.  He has multiple other medical problems.  Nonetheless he also has his training fistula from the olecranon bursa with no evidence of osteomyelitis on plain x-rays.  He presents now for operative management and excision of the olecranon bursa after explanation of risks and benefits.  Past Medical History:  Diagnosis Date  . Arthritis   . Asthma   . CHF (congestive heart failure) ([place])   . Complication of anesthesia 09/16/2016   in 1998 patient dislocated his shoulder and was told he should never be "put under" again because he was very difficult to awake from anesthesia. He is very concerned about this  . COPD (chronic obstructive pulmonary disease) ([place])   . Diabetes mellitus   . Guaiac positive stools     Guaiac positive stool with stable H and H.   . Hyperlipidemia   . Hypertension   . NSTEMI (non-ST elevated myocardial infarction) [facility]) April 2012   status post successful PCI and bare-metal  stenting of the first diagonal this admission.   . Tobacco abuse     Past Surgical History:  Procedure Laterality Date  . CARDIAC CATHETERIZATION     Normal EF. 90% diagonal lesion  . CLOSED REDUCTION SHOULDER DISLOCATION Right 1998  . CORONARY STENT PLACEMENT  April 2012   Diagonal lesion. Bare metal  . UMBILICAL HERNIA REPAIR      Family History  Problem Relation Age of Onset  . Heart attack Father   . Coronary artery disease Brother   . Heart attack Brother    Social History:  reports that he has been smoking Cigarettes.  He has been smoking about 0.25 packs per day. He has quit using smokeless tobacco. His smokeless tobacco use  included Chew. He reports that he does not drink alcohol or use drugs.  Allergies:  Allergies  Allergen Reactions  . Codeine Nausea And Vomiting    Medications Prior to Admission  Medication Sig Dispense Refill  . albuterol (PROAIR HFA) 108 (90 Base) MCG/ACT inhaler Inhale 2 puffs into the lungs every 6 (six) hours as needed for wheezing or shortness of breath. (Patient taking differently: Inhale 3-4 puffs into the lungs every 6 (six) hours as needed for wheezing or shortness of breath (3-4 puffs to help breathing). ) 1 Inhaler 2  . aspirin 81 MG chewable tablet Chew 243 mg by mouth daily. Takes 3 tablets daily    . Aspirin-Salicylamide-Caffeine (BC HEADACHE POWDER PO) Take 1 packet by mouth daily as needed (pain).    Marland Kitchen atorvastatin (LIPITOR) 40 MG tablet Take 1 tablet (40 mg total) by mouth daily. 30 tablet 0  . Fluticasone-Salmeterol (ADVAIR DISKUS) 100-50 MCG/DOSE AEPB Inhale 1 puff into the lungs 2 (two) times daily. 180 each 1  . gemfibrozil (LOPID) 600 MG tablet Take 600 mg by mouth 2 (two) times daily before a meal.    . glipiZIDE (GLUCOTROL) 5 MG tablet TAKE 1 TABLET BY MOUTH  DAILY BEFORE BREAKFAST (Patient taking differently: Take 5 mg by mouth daily before breakfast. ) 90 tablet 2  . Insulin Glargine (LANTUS SOLOSTAR) 100 UNIT/ML Solostar Pen Inject 16 Units into the skin at bedtime. 5 pen 11  .  metFORMIN (GLUCOPHAGE) 1000 MG tablet Take 1 tablet (1,000 mg total) by mouth 2 (two) times daily with a meal. 180 tablet 3  . metoprolol tartrate (LOPRESSOR) 25 MG tablet TAKE ONE-HALF TABLET BY  MOUTH TWO TIMES DAILY (Patient taking differently: Take 12.5 mg by mouth 2 (two) times daily. ) 60 tablet 3  . pregabalin (LYRICA) 50 MG capsule Take 1 capsule (50 mg total) by mouth 3 (three) times daily. (Patient taking differently: Take 50 mg by mouth at bedtime as needed (pain). ) 270 capsule 3  . varenicline (CHANTIX) 1 MG tablet Take 1 tablet (1 mg total) by mouth 2 (two) times daily. 180  tablet 1  . aspirin 81 MG tablet Take 1 tablet (81 mg total) by mouth daily as needed. (Patient not taking: Reported on 09/15/2016) 90 tablet 4  . Blood Glucose Monitoring Suppl (ACCU-CHEK AVIVA PLUS) w/Device KIT Use as directed 1 kit 2  . fenofibrate (TRICOR) 48 MG tablet Take 1 tablet (48 mg total) by mouth daily. (Patient not taking: Reported on 09/15/2016) 30 tablet 0  . glucose blood test strip Use to check blood sugar up to 1 times a day before  Meals Dx code- 250.00 100 each 3  . Insulin Pen Needle (PEN NEEDLES 31GX5/16") 31G X 8 MM MISC Use to give insulin at bedtime. E11.65 30 each 3  . Lancets Misc. (ACCU-CHEK SOFTCLIX LANCET DEV) KIT 100 lancets Use to test blood glucose daily. E11.9 1 kit 3  . nitroGLYCERIN (NITROSTAT) 0.4 MG SL tablet DISSOLVE 1 TABLET UNDER THE TONGUE EVERY 5 MINUTES AS  NEEDED FOR CHEST PAIN 100 tablet 0  . varenicline (CHANTIX) 0.5 MG tablet Days 1 to 3: 0.5 mg once daily. Days 4 to 7: 0.5 mg twice daily (Patient not taking: Reported on 09/15/2016) 10 tablet 0    Results for orders placed or performed during the hospital encounter of 09/20/16 (from the past 48 hour(s))  Glucose, capillary     Status: Abnormal   Collection Time: 09/20/16  2:45 PM  Result Value Ref Range   Glucose-Capillary 134 (H) 65 - 99 mg/dL   No results found.  Review of Systems  Musculoskeletal: Positive for joint pain.  All other systems reviewed and are negative.   Blood pressure 101/68, pulse 67, resp. rate (!) 23, SpO2 97 %. Physical Exam  Constitutional: He appears well-developed.  HENT:  Head: Normocephalic.  Eyes: Pupils are equal, round, and reactive to light.  Neck: Normal range of motion.  Cardiovascular: Normal rate.   Respiratory: Effort normal.  Neurological: He is alert.  Skin: Skin is warm.  Psychiatric: He has a normal mood and affect.   examination the left elbow demonstrates intact motor sensory function to the hand palpable radial pulse no proximal  lymphadenopathy boggy olecranon bursitis with draining fistula is noted.  Elbow has full range of motion flexion extension pronation and supination.  There is no real surrounding erythema except for the 2 cm diameter area around the olecranon bursa fistula.   Assessment/Plan Impression is chronic infected olecranon bursitis left elbow.  Plan is operative removal of the bursa.  Osteophyte is present on plain radiographs and that too will likely be removed.  Planned components and vancomycin powder was up the incision likely use a drain possibly.  That can be pulled tomorrow.  See him back in the office in 7 days.  We'll likely keep him in a splint to allow for incisional healing.  Anderson Malta, MD 09/20/2016, 3:17 PM

## 2016-09-21 ENCOUNTER — Encounter (HOSPITAL_COMMUNITY): Payer: Self-pay | Admitting: Orthopedic Surgery

## 2016-09-21 NOTE — Op Note (Signed)
NAMEADEL, BURCH       ACCOUNT NO.:  [id number]  MEDICAL RECORD NO.:  [id number]  LOCATION:  [facility]                         FACILITY:  [facility]  PHYSICIAN:  Burnard Bunting, M.D.    DATE OF BIRTH:  [birth year]  DATE OF PROCEDURE:  09/20/2016 DATE OF DISCHARGE:  09/20/2016                              OPERATIVE REPORT   PREOPERATIVE DIAGNOSIS:  Left elbow chronically infected olecranon bursitis.  POSTOPERATIVE DIAGNOSIS:  Left elbow chronically infected olecranon bursitis.  PROCEDURE:  Left elbow olecranon bursectomy.  SURGEON:  Burnard Bunting, M.D.  ASSISTANT:  Patrick Jupiter, RNFA.  INDICATIONS:  [name] Newton is a 56 year old patient over a year long history of chronically infected and draining olecranon bursitis, who presents now for operative management after explanation of risks and benefits.  PROCEDURE IN DETAIL:  The patient was brought to the operating room where LMA anesthetic was induced.  Preoperative antibiotics were administered.  Time-out was called.  Left elbow and arm were prescrubbed with alcohol and Betadine, allowed to air dry, prepped with DuraPrep solution and draped in a sterile manner.  The arm was elevated, but not exsanguinated.  Tourniquet was inflated for a total tourniquet time of 10 minutes at 250 mmHg.  An ellipsoid incision was made around the chronic draining fistula over the elbow tip.  This measured about 1.5 X 1.5 cm.  The incision was then extended proximally and distally.  The bursa was then excised with care being taken to avoid injury to the ulnar nerve.  Complete bursal excision was performed.  The infected appearing tissue did not contact any type of bone spur, which was apparent on plain radiographs.  For that reason, the bone was not exposed.  Thorough irrigation was performed.  Tourniquet was released. Bleeding points were encountered, controlled using bipolar electrocautery.  Skin was then closed by first closing dead space  using 2-0 Vicryl suture.  Then, the incision was closed after placing 500 mg of vancomycin powder within the incision using 3-0 nylon.  Aquacel dressing placed along with a shoulder sling.  The patient tolerated the procedure well without immediate complications and transferred to the recovery room in stable condition.     Burnard Bunting, M.D.     GSD/MEDQ  D:  09/20/2016  T:  09/21/2016  Job:  [id number]

## 2016-09-21 NOTE — Anesthesia Postprocedure Evaluation (Signed)
Anesthesia Post Note  Patient: [name] Newton  Procedure(s) Performed: Procedure(s) (LRB): EXCISION OLECRANON BURSA (Left)  Patient location during evaluation: PACU Anesthesia Type: Regional and MAC Level of consciousness: awake Pain management: pain level controlled Vital Signs Assessment: post-procedure vital signs reviewed and stable Respiratory status: spontaneous breathing Cardiovascular status: stable Anesthetic complications: no       Last Vitals:  Vitals:   09/20/16 1710 09/20/16 1725  BP: 123/86 113/79  Pulse: 61 60  Resp: 17 20  Temp:  36.7 C    Last Pain: There were no vitals filed for this visit.               Lovelace Cerveny     

## 2016-09-22 ENCOUNTER — Inpatient Hospital Stay (INDEPENDENT_AMBULATORY_CARE_PROVIDER_SITE_OTHER): Payer: Medicare Other | Admitting: Orthopedic Surgery

## 2016-09-25 LAB — AEROBIC/ANAEROBIC CULTURE W GRAM STAIN (SURGICAL/DEEP WOUND): Culture: NO GROWTH

## 2016-09-28 ENCOUNTER — Encounter (INDEPENDENT_AMBULATORY_CARE_PROVIDER_SITE_OTHER): Payer: Self-pay | Admitting: Orthopedic Surgery

## 2016-09-28 ENCOUNTER — Ambulatory Visit (INDEPENDENT_AMBULATORY_CARE_PROVIDER_SITE_OTHER): Payer: Medicare Other | Admitting: Orthopedic Surgery

## 2016-09-28 DIAGNOSIS — M25422 Effusion, left elbow: Secondary | ICD-10-CM

## 2016-09-28 NOTE — Progress Notes (Signed)
Post-Op Visit Note   Patient: [name] Newton           Date of Birth: 1960/06/24           MRN: [id number] Visit Date: 09/28/2016 PCP: Deneise Lever, MD   Assessment & Plan:  Chief Complaint:  Chief Complaint  Patient presents with  . Left Elbow - Routine Post Op, Follow-up   Visit Diagnoses:  1. Effusion of bursa of elbow, left     Plan: [name] is a 56 year old patient who is now about 8 days out from left elbow olecranon bursectomy on exam elbow has good range of motion no evidence of drainage or recurrent fluid collection.  Movement discontinued the sutures today and I will see him back in a month for recheck.  Steri-Strips are applied.  Okay to recheck range of motion in clinical recheck at that time tructions: Return in about 4 weeks (around 10/26/2016).   Orders:  No orders of the defined types were placed in this encounter.  No orders of the defined types were placed in this encounter.   Imaging: No results found.  PMFS History: Patient Active Problem List   Diagnosis Date Noted  . Effusion of bursa of elbow, left 09/08/2016  . Diabetic autonomic neuropathy associated with type 2 diabetes mellitus (H[facility]) 07/06/2016  . Dyslipidemia 07/06/2016  . Rash 02/15/2016  . Stable angina ([facility]) 02/15/2016  . Hyperlipidemia 11/23/2015  . Essential hypertension 07/26/2015  . Erectile dysfunction 07/26/2015  . Healthcare maintenance 07/15/2014  . GERD (gastroesophageal reflux disease) 07/24/2013  . Tobacco abuse 12/03/2012  . Fatty infiltration of liver 07/18/2012  . COPD (chronic obstructive pulmonary disease) (H[facility]) 07/04/2012  . Hypertriglyceridemia   . Coronary artery disease   . NSTEMI (non-ST elevated myocardial infarction) ([facility]) 08/29/2010  . Diabetes type 2, uncontrolled with neuropathy 05/30/2010   Past Medical History:  Diagnosis Date  . Arthritis   . Asthma   . CHF (congestive heart failure) ([facility])   . Complication of anesthesia 09/16/2016   in 1998 patient dislocated his shoulder and was told he should never be "put under" again because he was very difficult to awake from anesthesia. He is very concerned about this  . COPD (chronic obstructive pulmonary disease) ([facility])   . Diabetes mellitus   . Guaiac positive stools     Guaiac positive stool with stable H and H.   . Hyperlipidemia   . Hypertension   . NSTEMI (non-ST elevated myocardial infarction) [facility]) April 2012   status post successful PCI and bare-metal  stenting of the first diagonal this admission.   . Tobacco abuse     Family History  Problem Relation Age of Onset  . Heart attack Father   . Coronary artery disease Brother   . Heart attack Brother     Past Surgical History:  Procedure Laterality Date  . CARDIAC CATHETERIZATION     Normal EF. 90% diagonal lesion  . CLOSED REDUCTION SHOULDER DISLOCATION Right 1998  . CORONARY STENT PLACEMENT  April 2012   Diagonal lesion. Bare metal  . OLECRANON BURSECTOMY Left 09/20/2016   Procedure: EXCISION OLECRANON BURSA;  Surgeon: Cammy Copa, MD;  Location: [facility];  Service: Orthopedics;  Laterality: Left;  . UMBILICAL HERNIA REPAIR     Social History   Occupational History  . none     Unemployed since 2012   Social History Main Topics  . Smoking status: Current Some Day Smoker    Packs/day: 0.25  Types: Cigarettes  . Smokeless tobacco: Former Neurosurgeon    Types: Chew     Comment: Patches do not work.  Cutting back.  Wants Chantix  . Alcohol use No  . Drug use: No  . Sexual activity: Not on file

## 2016-10-04 ENCOUNTER — Ambulatory Visit (INDEPENDENT_AMBULATORY_CARE_PROVIDER_SITE_OTHER): Payer: Medicare Other | Admitting: Family Medicine

## 2016-10-04 ENCOUNTER — Encounter: Payer: Self-pay | Admitting: Family Medicine

## 2016-10-04 VITALS — BP 138/84 | HR 64 | Temp 98.2°F | Resp 16 | Ht 76.0 in | Wt 197.0 lb

## 2016-10-04 DIAGNOSIS — M7022 Olecranon bursitis, left elbow: Secondary | ICD-10-CM | POA: Diagnosis not present

## 2016-10-04 DIAGNOSIS — E1121 Type 2 diabetes mellitus with diabetic nephropathy: Secondary | ICD-10-CM

## 2016-10-04 DIAGNOSIS — F17200 Nicotine dependence, unspecified, uncomplicated: Secondary | ICD-10-CM | POA: Diagnosis not present

## 2016-10-04 DIAGNOSIS — IMO0002 Reserved for concepts with insufficient information to code with codable children: Secondary | ICD-10-CM

## 2016-10-04 DIAGNOSIS — E1165 Type 2 diabetes mellitus with hyperglycemia: Secondary | ICD-10-CM | POA: Diagnosis not present

## 2016-10-04 LAB — POCT GLYCOSYLATED HEMOGLOBIN (HGB A1C): Hemoglobin A1C: 9

## 2016-10-04 LAB — GLUCOSE, POCT (MANUAL RESULT ENTRY): POC Glucose: 141 mg/dl — AB (ref 70–99)

## 2016-10-04 MED ORDER — GLUCOSE BLOOD VI STRP: ORAL_STRIP | 3 refills | Status: DC

## 2016-10-04 NOTE — Patient Instructions (Addendum)
   IF you received an x-ray today, you will receive an invoice from [facility]. Please contact [facility] at [phone] with questions or concerns regarding your invoice.   IF you received labwork today, you will receive an invoice from [facility]. Please contact [facility] at [phone] with questions or concerns regarding your invoice.   Our billing staff will not be able to assist you with questions regarding bills from these companies.  You will be contacted with the lab results as soon as they are available. The fastest way to get your results is to activate your My Chart account. Instructions are located on the last page of this paperwork. If you have not heard from us regarding the results in 2 weeks, please contact this office.      Tobacco Use Disorder Tobacco use disorder (TUD) is a mental disorder. It is the long-term use of tobacco in spite of related health problems or difficulty with normal life activities. Tobacco is most commonly smoked as cigarettes and less commonly as cigars or pipes. Smokeless chewing tobacco and snuff are also popular. People with TUD get a feeling of extreme pleasure (euphoria) from using tobacco and have a desire to use it again and again. Repeated use of tobacco can cause problems. The addictive effects of tobacco are due mainly tothe ingredient nicotine. Nicotine also causes a rush of adrenaline (epinephrine) in the body. This leads to increased blood pressure, heart rate, and breathing rate. These changes may cause problems for people with high blood pressure, weak hearts, or lung disease. High doses of nicotine in children and pets can lead to seizures and death. Tobacco contains a number of other unsafe chemicals. These chemicals are especially harmful when inhaled as smoke and can damage almost every organ in the body. Smokers live shorter lives than nonsmokers and are at risk of dying from a number of diseases and cancers. Tobacco  smoke can also cause health problems for nonsmokers (due to inhaling secondhand smoke). Smoking is also a fire hazard. TUD usually starts in the late teenage years and is most common in young adults between the ages of 18 and 25 years. People who start smoking earlier in life are more likely to continue smoking as adults. TUD is somewhat more common in men than women. People with TUD are at higher risk for using alcohol and other drugs of abuse. What increases the risk? Risk factors for TUD include:  Having family members with the disorder.  Being around people who use tobacco.  Having an existing mental health issue such as schizophrenia, depression, bipolar disorder, ADHD, or posttraumatic stress disorder (PTSD). What are the signs or symptoms? People with tobacco use disorder have two or more of the following signs and symptoms within 12 months:  Use of more tobacco over a longer period than intended.  Not able to cut down or control tobacco use.  A lot of time spent obtaining or using tobacco.  Strong desire or urge to use tobacco (craving). Cravings may last for 6 months or longer after quitting.  Use of tobacco even when use leads to major problems at work, school, or home.  Use of tobacco even when use leads to relationship problems.  Giving up or cutting down on important life activities because of tobacco use.  Repeatedly using tobacco in situations where it puts you or others in physical danger, like smoking in bed.  Use of tobacco even when it is known that a physical or mental problem   is likely related to tobacco use.  Physical problems are numerous and may include chronic bronchitis, emphysema, lung and other cancers, gum disease, high blood pressure, heart disease, and stroke.  Mental problems caused by tobacco may include difficulty sleeping and anxiety.  Need to use greater amounts of tobacco to get the same effect. This means you have developed a  tolerance.  Withdrawal symptoms as a result of stopping or rapidly cutting back use. These symptoms may last a month or more after quitting and include the following:  Depressed, anxious, or irritable mood.  Difficulty concentrating.  Increased appetite.  Restlessness or trouble sleeping.  Use of tobacco to avoid withdrawal symptoms. How is this diagnosed? Tobacco use disorder is diagnosed by your health care provider. A diagnosis may be made by:  Your health care provider asking questions about your tobacco use and any problems it may be causing.  A physical exam.  Lab tests.  You may be referred to a mental health professional or addiction specialist. The severity of tobacco use disorder depends on the number of signs and symptoms you have:  Mild-Two or three symptoms.  Moderate-Four or five symptoms.  Severe-Six or more symptoms. How is this treated? Many people with tobacco use disorder are unable to quit on their own and need help. Treatment options include the following:  Nicotine replacement therapy (NRT). NRT provides nicotine without the other harmful chemicals in tobacco. NRT gradually lowers the dosage of nicotine in the body and reduces withdrawal symptoms. NRT is available in over-the-counter forms (gum, lozenges, and skin patches) as well as prescription forms (mouth inhaler and nasal spray).  Medicines.This may include:  Antidepressant medicine that may reduce nicotine cravings.  A medicine that acts on nicotine receptors in the brain to reduce cravings and withdrawal symptoms. It may also block the effects of tobacco in people with TUD who relapse.  Counseling or talk therapy. A form of talk therapy called behavioral therapy is commonly used to treat people with TUD. Behavioral therapy looks at triggers for tobacco use, how to avoid them, and how to cope with cravings. It is most effective in person or by phone but is also available in self-help forms (books  and Internet websites).  Support groups. These provide emotional support, advice, and guidance for quitting tobacco. The most effective treatment for TUD is usually a combination of medicine, talk therapy, and support groups. Follow these instructions at home:  Keep all follow-up visits as directed by your health care provider. This is important.  Take medicines only as directed by your health care provider.  Check with your health care provider before starting new prescription or over-the-counter medicines. Contact a health care provider if:  You are not able to take your medicines as prescribed.  Treatment is not helping your TUD and your symptoms get worse. Get help right away if:  You have serious thoughts about hurting yourself or others.  You have trouble breathing, chest pain, sudden weakness, or sudden numbness in part of your body. This information is not intended to replace advice given to you by your health care provider. Make sure you discuss any questions you have with your health care provider. Document Released: 01/20/2004 Document Revised: 01/17/2016 Document Reviewed: 07/12/2013 Elsevier Interactive Patient Education  2017 [employer]  

## 2016-10-04 NOTE — Progress Notes (Signed)
Chief Complaint  Patient presents with  . Follow-up    patient states he is here for an f/u on his blood sugar count  . blood sugar    HPI   Olecranon bursitis Pt is s/p left elbow olecranon bursectomy He reports that that he is having good range of motion Tolerated the procedure well Denies infection or swelling  Diabetes Mellitus Type II, Follow-up: Patient here for follow-up of Type 2 diabetes mellitus.  Current symptoms/problems include hyperglycemia and have been improving. Symptoms have been present for a few months. Pt with uncontrolled diabetes who is being treated with insulin, metformin and glipizide  Lab Results  Component Value Date   HGBA1C 9.0 10/04/2016    Component     Latest Ref Rng & Units 02/15/2016 07/06/2016 08/19/2016  Hemoglobin A1C     4.8 - 5.6 % 6.8 12.4 (H) 10.8  Est. average glucose Bld gHb Est-mCnc     mg/dL  309   Mean Plasma Glucose     mg/dL      Component     Latest Ref Rng & Units 09/16/2016  Hemoglobin A1C     4.8 - 5.6 % 10.2 (H)  Est. average glucose Bld gHb Est-mCnc     mg/dL   Mean Plasma Glucose     mg/dL 246   Wt Readings from Last 3 Encounters:  10/04/16 197 lb (89.4 kg)  09/16/16 200 lb 4.8 oz (90.9 kg)  08/19/16 198 lb (89.8 kg)   Tobacco abuse Pt reports that he has to pay for the chantix He states he stopped  Due to the cost since insurance did not cover it He is smoking about 1/2 pack a day and is still trying to cut back He has a 35 month old baby and wants to quit for his baby and is very motivated  Past Medical History:  Diagnosis Date  . Arthritis   . Asthma   . CHF (congestive heart failure) ([place])   . Complication of anesthesia 09/16/2016   in 1998 patient dislocated his shoulder and was told he should never be "put under" again because he was very difficult to awake from anesthesia. He is very concerned about this  . COPD (chronic obstructive pulmonary disease) ([place])   . Diabetes mellitus   . Guaiac  positive stools     Guaiac positive stool with stable H and H.   . Hyperlipidemia   . Hypertension   . NSTEMI (non-ST elevated myocardial infarction) [facility]) April 2012   status post successful PCI and bare-metal  stenting of the first diagonal this admission.   . Tobacco abuse     Current Outpatient Prescriptions  Medication Sig Dispense Refill  . albuterol (PROAIR HFA) 108 (90 Base) MCG/ACT inhaler Inhale 2 puffs into the lungs every 6 (six) hours as needed for wheezing or shortness of breath. (Patient taking differently: Inhale 3-4 puffs into the lungs every 6 (six) hours as needed for wheezing or shortness of breath (3-4 puffs to help breathing). ) 1 Inhaler 2  . aspirin 81 MG chewable tablet Chew 243 mg by mouth daily. Takes 3 tablets daily    . Aspirin-Salicylamide-Caffeine (BC HEADACHE POWDER PO) Take 1 packet by mouth daily as needed (pain).    Marland Kitchen atorvastatin (LIPITOR) 40 MG tablet Take 1 tablet (40 mg total) by mouth daily. 30 tablet 0  . Blood Glucose Monitoring Suppl (ACCU-CHEK AVIVA PLUS) w/Device KIT Use as directed 1 kit 2  . Fluticasone-Salmeterol (ADVAIR DISKUS) 100-50  MCG/DOSE AEPB Inhale 1 puff into the lungs 2 (two) times daily. 180 each 1  . gemfibrozil (LOPID) 600 MG tablet Take 600 mg by mouth 2 (two) times daily before a meal.    . glipiZIDE (GLUCOTROL) 5 MG tablet TAKE 1 TABLET BY MOUTH  DAILY BEFORE BREAKFAST (Patient taking differently: Take 5 mg by mouth daily before breakfast. ) 90 tablet 2  . glucose blood test strip Use to check blood sugar up to 1 times a day before  Meals Dx code- 250.00 100 each 3  . Insulin Glargine (LANTUS SOLOSTAR) 100 UNIT/ML Solostar Pen Inject 16 Units into the skin at bedtime. 5 pen 11  . Insulin Pen Needle (PEN NEEDLES 31GX5/16") 31G X 8 MM MISC Use to give insulin at bedtime. E11.65 30 each 3  . Lancets Misc. (ACCU-CHEK SOFTCLIX LANCET DEV) KIT 100 lancets Use to test blood glucose daily. E11.9 1 kit 3  . metFORMIN (GLUCOPHAGE) 1000 MG  tablet Take 1 tablet (1,000 mg total) by mouth 2 (two) times daily with a meal. 180 tablet 3  . metoprolol tartrate (LOPRESSOR) 25 MG tablet TAKE ONE-HALF TABLET BY  MOUTH TWO TIMES DAILY (Patient taking differently: Take 12.5 mg by mouth 2 (two) times daily. ) 60 tablet 3  . nitroGLYCERIN (NITROSTAT) 0.4 MG SL tablet DISSOLVE 1 TABLET UNDER THE TONGUE EVERY 5 MINUTES AS  NEEDED FOR CHEST PAIN 100 tablet 0  . pregabalin (LYRICA) 50 MG capsule Take 1 capsule (50 mg total) by mouth 3 (three) times daily. (Patient taking differently: Take 50 mg by mouth at bedtime as needed (pain). ) 270 capsule 3  . varenicline (CHANTIX) 1 MG tablet Take 1 tablet (1 mg total) by mouth 2 (two) times daily. 180 tablet 1   No current facility-administered medications for this visit.     Allergies:  Allergies  Allergen Reactions  . Codeine Nausea And Vomiting    Past Surgical History:  Procedure Laterality Date  . CARDIAC CATHETERIZATION     Normal EF. 90% diagonal lesion  . CLOSED REDUCTION SHOULDER DISLOCATION Right 1998  . CORONARY STENT PLACEMENT  April 2012   Diagonal lesion. Bare metal  . OLECRANON BURSECTOMY Left 09/20/2016   Procedure: EXCISION OLECRANON BURSA;  Surgeon: Meredith Pel, MD;  Location: [facility];  Service: Orthopedics;  Laterality: Left;  . UMBILICAL HERNIA REPAIR      Social History   Social History  . Marital status: Divorced    Spouse name: N/A  . Number of children: N/A  . Years of education: 10   Occupational History  . none     Unemployed since 2012   Social History Main Topics  . Smoking status: Current Some Day Smoker    Packs/day: 0.25    Types: Cigarettes  . Smokeless tobacco: Former Systems developer    Types: Chew     Comment: Patches do not work.  Cutting back.  Wants Chantix  . Alcohol use No  . Drug use: No  . Sexual activity: Not Asked   Other Topics Concern  . None   Social History Narrative   Patient is intermittently homeless without any income at the  moment.           Review of Systems  Constitutional: Negative for chills and fever.  Eyes: Negative for blurred vision and double vision.  Cardiovascular: Negative for chest pain and leg swelling.  Gastrointestinal: Negative for abdominal pain, nausea and vomiting.  Skin: Negative for itching and rash.  Objective: Vitals:   10/04/16 1022  BP: 138/84  Pulse: 64  Resp: 16  Temp: 98.2 F (36.8 C)  TempSrc: Oral  SpO2: 100%  Weight: 197 lb (89.4 kg)  Height: 6' 4"  (1.93 m)    Physical Exam  Constitutional: He is oriented to person, place, and time. He appears well-developed and well-nourished.  HENT:  Head: Normocephalic and atraumatic.  Cardiovascular: Normal rate, regular rhythm and normal heart sounds.   No murmur heard. Pulmonary/Chest: Effort normal and breath sounds normal. No respiratory distress. He has no wheezes.  Musculoskeletal: Normal range of motion. He exhibits no edema.  Left elbow incision clean and healing well  Neurological: He is alert and oriented to person, place, and time.    Assessment and Plan [name] Newton was seen today for follow-up and blood sugar.  Diagnoses and all orders for this visit:  Uncontrolled type 2 diabetes mellitus with diabetic nephropathy, without long-term current use of insulin ([place])- a1c steadily improving with diabetes meds and dietary modification Will recheck in 2 months -     POCT glycosylated hemoglobin (Hb A1C) -     POCT glucose (manual entry) -     glucose blood test strip; Use to check blood sugar up to 1 times a day before  Meals Dx code- 250.00  Tobacco use disorder- due to cost of chantix pt unable to get the appropriate help Recommended smoking cessation program -     Ambulatory referral to Smoking Cessation Program  Olecranon bursitis of left elbow- improved following bursectomy     [name] Newton

## 2016-10-05 ENCOUNTER — Ambulatory Visit: Payer: Medicare Other | Admitting: Family Medicine

## 2016-10-27 ENCOUNTER — Emergency Department (HOSPITAL_COMMUNITY): Payer: Medicare Other

## 2016-10-27 ENCOUNTER — Emergency Department (HOSPITAL_COMMUNITY)
Admission: EM | Admit: 2016-10-27 | Discharge: 2016-10-28 | Disposition: A | Discharge status: Other Acute Inpatient Hospital | Payer: Medicare Other | Attending: Emergency Medicine | Admitting: Emergency Medicine

## 2016-10-27 ENCOUNTER — Encounter (HOSPITAL_COMMUNITY): Payer: Self-pay | Admitting: Emergency Medicine

## 2016-10-27 DIAGNOSIS — Z79899 Other long term (current) drug therapy: Secondary | ICD-10-CM | POA: Insufficient documentation

## 2016-10-27 DIAGNOSIS — Z794 Long term (current) use of insulin: Secondary | ICD-10-CM | POA: Insufficient documentation

## 2016-10-27 DIAGNOSIS — J45909 Unspecified asthma, uncomplicated: Secondary | ICD-10-CM | POA: Insufficient documentation

## 2016-10-27 DIAGNOSIS — T50901A Poisoning by unspecified drugs, medicaments and biological substances, accidental (unintentional), initial encounter: Secondary | ICD-10-CM | POA: Diagnosis not present

## 2016-10-27 DIAGNOSIS — I2510 Atherosclerotic heart disease of native coronary artery without angina pectoris: Secondary | ICD-10-CM | POA: Insufficient documentation

## 2016-10-27 DIAGNOSIS — J449 Chronic obstructive pulmonary disease, unspecified: Secondary | ICD-10-CM | POA: Diagnosis not present

## 2016-10-27 DIAGNOSIS — I509 Heart failure, unspecified: Secondary | ICD-10-CM | POA: Diagnosis not present

## 2016-10-27 DIAGNOSIS — I110 Hypertensive heart disease with heart failure: Secondary | ICD-10-CM | POA: Insufficient documentation

## 2016-10-27 DIAGNOSIS — Z7984 Long term (current) use of oral hypoglycemic drugs: Secondary | ICD-10-CM | POA: Insufficient documentation

## 2016-10-27 DIAGNOSIS — T426X2A Poisoning by other antiepileptic and sedative-hypnotic drugs, intentional self-harm, initial encounter: Secondary | ICD-10-CM | POA: Insufficient documentation

## 2016-10-27 DIAGNOSIS — Z7982 Long term (current) use of aspirin: Secondary | ICD-10-CM | POA: Diagnosis not present

## 2016-10-27 DIAGNOSIS — E1140 Type 2 diabetes mellitus with diabetic neuropathy, unspecified: Secondary | ICD-10-CM | POA: Diagnosis not present

## 2016-10-27 DIAGNOSIS — I252 Old myocardial infarction: Secondary | ICD-10-CM | POA: Insufficient documentation

## 2016-10-27 DIAGNOSIS — F17210 Nicotine dependence, cigarettes, uncomplicated: Secondary | ICD-10-CM | POA: Insufficient documentation

## 2016-10-27 DIAGNOSIS — R402441 Other coma, without documented Glasgow coma scale score, or with partial score reported, in the field [EMT or ambulance]: Secondary | ICD-10-CM | POA: Diagnosis not present

## 2016-10-27 DIAGNOSIS — R4182 Altered mental status, unspecified: Secondary | ICD-10-CM | POA: Insufficient documentation

## 2016-10-27 LAB — COMPREHENSIVE METABOLIC PANEL
ALT: 28 U/L (ref 17–63)
AST: 25 U/L (ref 15–41)
Albumin: 4.2 g/dL (ref 3.5–5.0)
Alkaline Phosphatase: 40 U/L (ref 38–126)
Anion gap: 8 (ref 5–15)
BUN: 15 mg/dL (ref 6–20)
CO2: 24 mmol/L (ref 22–32)
Calcium: 9.3 mg/dL (ref 8.9–10.3)
Chloride: 103 mmol/L (ref 101–111)
Creatinine, Ser: 0.85 mg/dL (ref 0.61–1.24)
GFR calc Af Amer: >60 mL/min (ref >60)
GFR calc non Af Amer: >60 mL/min (ref >60)
Glucose, Bld: 174 mg/dL — ABNORMAL HIGH (ref 65–99)
Potassium: 3.8 mmol/L (ref 3.5–5.1)
Sodium: 135 mmol/L (ref 135–145)
Total Bilirubin: 0.5 mg/dL (ref 0.3–1.2)
Total Protein: 7.4 g/dL (ref 6.5–8.1)

## 2016-10-27 LAB — CBC WITH DIFFERENTIAL/PLATELET
Basophils Absolute: 0 10*3/uL (ref 0.0–0.1)
Basophils Relative: 0 %
Eosinophils Absolute: 0.2 10*3/uL (ref 0.0–0.7)
Eosinophils Relative: 3 %
HCT: 42.8 % (ref 39.0–52.0)
Hemoglobin: 14.1 g/dL (ref 13.0–17.0)
Lymphocytes Relative: 30 %
Lymphs Abs: 2.6 10*3/uL (ref 0.7–4.0)
MCH: 29.4 pg (ref 26.0–34.0)
MCHC: 32.9 g/dL (ref 30.0–36.0)
MCV: 89.2 fL (ref 78.0–100.0)
Monocytes Absolute: 0.5 10*3/uL (ref 0.1–1.0)
Monocytes Relative: 6 %
Neutro Abs: 5.1 10*3/uL (ref 1.7–7.7)
Neutrophils Relative %: 61 %
Platelets: 289 10*3/uL (ref 150–400)
RBC: 4.8 MIL/uL (ref 4.22–5.81)
RDW: 14.3 % (ref 11.5–15.5)
WBC: 8.5 10*3/uL (ref 4.0–10.5)

## 2016-10-27 LAB — URINALYSIS, ROUTINE W REFLEX MICROSCOPIC
Bilirubin Urine: NEGATIVE
Glucose, UA: 50 mg/dL — AB
Hgb urine dipstick: NEGATIVE
Ketones, ur: NEGATIVE mg/dL
Leukocytes, UA: NEGATIVE
Nitrite: NEGATIVE
Protein, ur: NEGATIVE mg/dL
Specific Gravity, Urine: 1.012 (ref 1.005–1.030)
pH: 5 (ref 5.0–8.0)

## 2016-10-27 LAB — I-STAT CHEM 8, ED
BUN: 19 mg/dL (ref 6–20)
Calcium, Ion: 1.03 mmol/L — ABNORMAL LOW (ref 1.15–1.40)
Chloride: 105 mmol/L (ref 101–111)
Creatinine, Ser: 0.8 mg/dL (ref 0.61–1.24)
Glucose, Bld: 171 mg/dL — ABNORMAL HIGH (ref 65–99)
HCT: 44 % (ref 39.0–52.0)
Hemoglobin: 15 g/dL (ref 13.0–17.0)
Potassium: 3.8 mmol/L (ref 3.5–5.1)
Sodium: 136 mmol/L (ref 135–145)
TCO2: 22 mmol/L (ref 0–100)

## 2016-10-27 LAB — SALICYLATE LEVEL: Salicylate Lvl: <7 mg/dL (ref 2.8–30.0)

## 2016-10-27 LAB — RAPID URINE DRUG SCREEN, HOSP PERFORMED
Amphetamines: NOT DETECTED
Barbiturates: NOT DETECTED
Benzodiazepines: NOT DETECTED
Cocaine: NOT DETECTED
Opiates: NOT DETECTED
Tetrahydrocannabinol: POSITIVE — AB

## 2016-10-27 LAB — CBG MONITORING, ED: Glucose-Capillary: 97 mg/dL (ref 65–99)

## 2016-10-27 LAB — ACETAMINOPHEN LEVEL: Acetaminophen (Tylenol), Serum: <10 ug/mL — ABNORMAL LOW (ref 10–30)

## 2016-10-27 LAB — I-STAT TROPONIN, ED: Troponin i, poc: 0 ng/mL (ref 0.00–0.08)

## 2016-10-27 LAB — I-STAT CG4 LACTIC ACID, ED: Lactic Acid, Venous: 0.9 mmol/L (ref 0.5–1.9)

## 2016-10-27 LAB — ETHANOL: Alcohol, Ethyl (B): <5 mg/dL (ref <5)

## 2016-10-27 MED ORDER — SODIUM CHLORIDE 0.9 % IV BOLUS (SEPSIS)
1000.0000 mL | Freq: Once | INTRAVENOUS | Status: AC
  Administered 2016-10-27: 1000 mL via INTRAVENOUS

## 2016-10-27 MED ORDER — NALOXONE HCL 2 MG/2ML IJ SOSY
PREFILLED_SYRINGE | INTRAMUSCULAR | Status: AC
  Administered 2016-10-27: 1 mg
  Filled 2016-10-27: qty 2

## 2016-10-27 MED ORDER — NALOXONE HCL 2 MG/2ML IJ SOSY: 1.0000 mg | PREFILLED_SYRINGE | Freq: Once | INTRAMUSCULAR | Status: DC

## 2016-10-27 NOTE — ED Notes (Signed)
TTS recommends inpatient.  Pt unhappy and wishes to leave.  MD has completed IVC paper work and [facility]ed it off to [employer].

## 2016-10-27 NOTE — ED Provider Notes (Signed)
[facility] DEPT Provider Note   CSN: [id number] Arrival date & time: 10/27/16  1447     History   Chief Complaint Chief Complaint  Patient presents with  . Drug Overdose  . Altered Mental Status    HPI [name] Newton is a 56 y.o. male.  Patient states that he took a whole bottle of pills of Lyrica around noon today in attempt to kill himself. He then drove the car around for a while and his wife found him in the car around 2:30. EMS that brought him to the hospital   The history is provided by the patient. No language interpreter was used.  Drug Overdose  This is a new problem. The current episode started 1 to 2 hours ago. The problem occurs rarely. The problem has not changed since onset.Pertinent negatives include no chest pain and no abdominal pain. Nothing aggravates the symptoms. Nothing relieves the symptoms. He has tried nothing for the symptoms. The treatment provided no relief.  Altered Mental Status      Past Medical History:  Diagnosis Date  . Arthritis   . Asthma   . CHF (congestive heart failure) ([place])   . Complication of anesthesia 09/16/2016   in 1998 patient dislocated his shoulder and was told he should never be "put under" again because he was very difficult to awake from anesthesia. He is very concerned about this  . COPD (chronic obstructive pulmonary disease) ([place])   . Diabetes mellitus   . Guaiac positive stools     Guaiac positive stool with stable H and H.   . Hyperlipidemia   . Hypertension   . NSTEMI (non-ST elevated myocardial infarction) [facility]) April 2012   status post successful PCI and bare-metal  stenting of the first diagonal this admission.   . Tobacco abuse     Patient Active Problem List   Diagnosis Date Noted  . Effusion of bursa of elbow, left 09/08/2016  . Diabetic autonomic neuropathy associated with type 2 diabetes mellitus ([place]) 07/06/2016  . Dyslipidemia 07/06/2016  . Rash 02/15/2016  . Stable angina ([place])  02/15/2016  . Hyperlipidemia 11/23/2015  . Essential hypertension 07/26/2015  . Erectile dysfunction 07/26/2015  . Healthcare maintenance 07/15/2014  . GERD (gastroesophageal reflux disease) 07/24/2013  . Tobacco abuse 12/03/2012  . Fatty infiltration of liver 07/18/2012  . COPD (chronic obstructive pulmonary disease) ([place]) 07/04/2012  . Hypertriglyceridemia   . Coronary artery disease   . NSTEMI (non-ST elevated myocardial infarction) ([place]) 08/29/2010  . Diabetes type 2, uncontrolled with neuropathy 05/30/2010    Past Surgical History:  Procedure Laterality Date  . CARDIAC CATHETERIZATION     Normal EF. 90% diagonal lesion  . CLOSED REDUCTION SHOULDER DISLOCATION Right 1998  . CORONARY STENT PLACEMENT  April 2012   Diagonal lesion. Bare metal  . OLECRANON BURSECTOMY Left 09/20/2016   Procedure: EXCISION OLECRANON BURSA;  Surgeon: Meredith Pel, MD;  Location: [place];  Service: Orthopedics;  Laterality: Left;  . UMBILICAL HERNIA REPAIR         Home Medications    Prior to Admission medications   Medication Sig Start Date End Date Taking? Authorizing Provider  albuterol (PROAIR HFA) 108 (90 Base) MCG/ACT inhaler Inhale 2 puffs into the lungs every 6 (six) hours as needed for wheezing or shortness of breath. Patient taking differently: Inhale 3-4 puffs into the lungs every 6 (six) hours as needed for wheezing or shortness of breath (3-4 puffs to help breathing).  07/06/16   Nolon Rod,  Zoe A, MD  aspirin 81 MG chewable tablet Chew 243 mg by mouth daily. Takes 3 tablets daily    [provider]  Aspirin-Salicylamide-Caffeine (BC HEADACHE POWDER PO) Take 1 packet by mouth daily as needed (pain).    [provider]  atorvastatin (LIPITOR) 40 MG tablet Take 1 tablet (40 mg total) by mouth daily. 07/13/16   Forrest Moron, MD  Blood Glucose Monitoring Suppl (ACCU-CHEK AVIVA PLUS) w/Device KIT Use as directed 07/06/16   Delia Chimes A, MD  Fluticasone-Salmeterol  (ADVAIR DISKUS) 100-50 MCG/DOSE AEPB Inhale 1 puff into the lungs 2 (two) times daily. 07/06/16   Forrest Moron, MD  gemfibrozil (LOPID) 600 MG tablet Take 600 mg by mouth 2 (two) times daily before a meal.    [provider]  glipiZIDE (GLUCOTROL) 5 MG tablet TAKE 1 TABLET BY MOUTH  DAILY BEFORE BREAKFAST Patient taking differently: Take 5 mg by mouth daily before breakfast.  07/06/16   Delia Chimes A, MD  glucose blood test strip Use to check blood sugar up to 1 times a day before  Meals Dx code- 250.00 10/04/16   Forrest Moron, MD  Insulin Glargine (LANTUS SOLOSTAR) 100 UNIT/ML Solostar Pen Inject 16 Units into the skin at bedtime. 07/20/16   Forrest Moron, MD  Insulin Pen Needle (PEN NEEDLES 31GX5/16") 31G X 8 MM MISC Use to give insulin at bedtime. E11.65 07/12/16   Forrest Moron, MD  Lancets Misc. (ACCU-CHEK SOFTCLIX LANCET DEV) KIT 100 lancets Use to test blood glucose daily. E11.9 07/13/16   Forrest Moron, MD  metFORMIN (GLUCOPHAGE) 1000 MG tablet Take 1 tablet (1,000 mg total) by mouth 2 (two) times daily with a meal. 07/06/16 07/06/17  Delia Chimes A, MD  metoprolol tartrate (LOPRESSOR) 25 MG tablet TAKE ONE-HALF TABLET BY  MOUTH TWO TIMES DAILY Patient taking differently: Take 12.5 mg by mouth 2 (two) times daily.  07/06/16   Forrest Moron, MD  nitroGLYCERIN (NITROSTAT) 0.4 MG SL tablet DISSOLVE 1 TABLET UNDER THE TONGUE EVERY 5 MINUTES AS  NEEDED FOR CHEST PAIN 06/22/16   Burgess Estelle, MD  pregabalin (LYRICA) 50 MG capsule Take 1 capsule (50 mg total) by mouth 3 (three) times daily. Patient taking differently: Take 50 mg by mouth at bedtime as needed (pain).  07/06/16 07/06/17  Forrest Moron, MD    Family History Family History  Problem Relation Age of Onset  . Heart attack Father   . Coronary artery disease Brother   . Heart attack Brother     Social History Social History  Substance Use Topics  . Smoking status: Current Some Day Smoker    Packs/day: 0.25     Types: Cigarettes  . Smokeless tobacco: Former Systems developer    Types: Chew     Comment: Patches do not work.  Cutting back.  Wants Chantix  . Alcohol use No     Allergies   Codeine   Review of Systems Review of Systems  Unable to perform ROS: Mental status change  Cardiovascular: Negative for chest pain.  Gastrointestinal: Negative for abdominal pain.     Physical Exam Updated Vital Signs BP (!) 156/87   Pulse (!) 43   Temp 97.6 F (36.4 C) (Oral)   Resp 20   Ht _0  (1.905 m)   Wt 89.4 kg (197 lb)   SpO2 95%   BMI 24.62 kg/m   Physical Exam  Constitutional: He appears well-developed.  HENT:  Head: Normocephalic.  Eyes: Conjunctivae and EOM are normal. No scleral icterus.  Neck: Neck supple. No thyromegaly present.  Cardiovascular: Normal rate and regular rhythm.  Exam reveals no gallop and no friction rub.   No murmur heard. Pulmonary/Chest: No stridor. He has no wheezes. He has no rales. He exhibits no tenderness.  Abdominal: He exhibits no distension. There is no tenderness. There is no rebound.  Musculoskeletal: Normal range of motion. He exhibits no edema. Deformity: his exam at that point was normal except for depression.  Lymphadenopathy:    He has no cervical adenopathy.  Neurological: He exhibits normal muscle tone. Coordination normal.  Patient lethargic and only responding to painful stimuli  Skin: No rash noted. No erythema.  Psychiatric: He has a normal mood and affect. His behavior is normal.     ED Treatments / Results  Labs (all labs ordered are listed, but only abnormal results are displayed) Labs Reviewed  COMPREHENSIVE METABOLIC PANEL - Abnormal; Notable for the following:       Result Value   Glucose, Bld 174 (*)    All other components within normal limits  ACETAMINOPHEN LEVEL - Abnormal; Notable for the following:    Acetaminophen (Tylenol), Serum <10 (*)    All other components within normal limits  I-STAT CHEM 8, ED - Abnormal; Notable  for the following:    Glucose, Bld 171 (*)    Calcium, Ion 1.03 (*)    All other components within normal limits  CBC WITH DIFFERENTIAL/PLATELET  ETHANOL  SALICYLATE LEVEL  RAPID URINE DRUG SCREEN, HOSP PERFORMED  URINALYSIS, ROUTINE W REFLEX MICROSCOPIC  I-STAT CG4 LACTIC ACID, ED  CBG MONITORING, ED  I-STAT TROPOININ, ED    EKG  EKG Interpretation  Date/Time:  Thursday Oct 27 2016 14:48:35 EDT Ventricular Rate:  62 PR Interval:    QRS Duration: 85 QT Interval:  388 QTC Calculation: 394 R Axis:   86 Text Interpretation:  Sinus rhythm Minimal ST elevation, inferior leads Confirmed by Dealva Lafoy  MD, Jaren Vanetten ([id number]) on 10/27/2016 4:30:20 PM       Radiology Dg Chest Port 1 View  Result Date: 10/27/2016 CLINICAL DATA:  Overdose EXAM: PORTABLE CHEST 1 VIEW COMPARISON:  09/19/2010 FINDINGS: Normal heart size and mediastinal contours. Mildly low lung volumes without infiltrate or edema. No effusion or pneumothorax. No acute osseous findings. IMPRESSION: No evidence of active disease. Electronically Signed   By: Monte Fantasia M.D.   On: 10/27/2016 15:17    Procedures Procedures (including critical care time)  Medications Ordered in ED Medications  naloxone [facility]) injection 1 mg (1 mg Intravenous Not Given 10/27/16 1517)  naloxone [facility]) 2 MG/2ML injection (1 mg  Given 10/27/16 1454)  sodium chloride 0.9 % bolus 1,000 mL (0 mLs Intravenous Stopped 10/27/16 1641)     Initial Impression / Assessment and Plan / ED Course  I have reviewed the triage vital signs and the nursing notes.  Pertinent labs & imaging results that were available during my care of the patient were reviewed by me and considered in my medical decision making (see chart for details). After the patient was here for about 45 minutes she started to wake. He stated that he took the pills around noon in attempt to kill himself that he was very depressed. Poison control was consult did and stated that he should be  observed for approximate 6 hours from the time he took the medicine. The patient was [facility] and will see behavior health   CRITICAL CARE Performed by:  Arnol Mcgibbon L Total critical care time: 45 minutes Critical care time was exclusive of separately billable procedures and treating other patients. Critical care was necessary to treat or prevent imminent or life-threatening deterioration. Critical care was time spent personally by me on the following activities: development of treatment plan with patient and/or surrogate as well as nursing, discussions with consultants, evaluation of patient's response to treatment, examination of patient, obtaining history from patient or surrogate, ordering and performing treatments and interventions, ordering and review of laboratory studies, ordering and review of radiographic studies, pulse oximetry and re-evaluation of patient's condition.    Final Clinical Impressions(s) / ED Diagnoses   Final diagnoses:  None    New Prescriptions New Prescriptions   No medications on file     Milton Ferguson, MD 10/27/16 1654

## 2016-10-27 NOTE — ED Notes (Signed)
Got patient undress and on the monitor into a gown

## 2016-10-27 NOTE — ED Notes (Signed)
Pt given cup of water to drink. And states he does not wish to be here and that he wants to go home.

## 2016-10-27 NOTE — ED Triage Notes (Addendum)
[facility] with [facility] r/t possible OD. Per ems, pt took "full bottle of Lyrica (90 pills)" at unknown time of day. Pt was found down outside his house, responsive only to painful stimuli. NPA placed for snoring and apnea, sats 95%. Arrives to ED with GCS of 12, wakes to verbal stimuli, oriented x 3. 1mg  Narcan given, no changes, sats 94% on RA with NPA

## 2016-10-27 NOTE — BH Assessment (Signed)
Tele Assessment Note   [name] Newton is an 56 y.o. male. Pt denies SI/HI and AVH. Pt overdosed on a whole bottle of Lyrica today. Pt states "I have suicidal at that time but not now." Pt also stated "I don't know why I did it, I regret it." Pt denies current or previous mental health treatment. Pt denies current mental health medication. Pt was a poor historian. Pt did not want to answer assessment questions. Pt was angry about having to complete an assessment. Pt continously stated that he made a mistake and that he wanted to go home. Pt denies SA and abuse. Pt reports having family support.  [name], NP recommends inpatient treatment. TTS will seek placement.   Diagnosis:  F33.2 MDD  Past Medical History:  Past Medical History:  Diagnosis Date  . Arthritis   . Asthma   . CHF (congestive heart failure) (HCC)   . Complication of anesthesia 09/16/2016   in 1998 patient dislocated his shoulder and was told he should never be "put under" again because he was very difficult to awake from anesthesia. He is very concerned about this  . COPD (chronic obstructive pulmonary disease) (HCC)   . Diabetes mellitus   . Guaiac positive stools     Guaiac positive stool with stable H and H.   . Hyperlipidemia   . Hypertension   . NSTEMI (non-ST elevated myocardial infarction) [facility]) April 2012   status post successful PCI and bare-metal  stenting of the first diagonal this admission.   . Tobacco abuse     Past Surgical History:  Procedure Laterality Date  . CARDIAC CATHETERIZATION     Normal EF. 90% diagonal lesion  . CLOSED REDUCTION SHOULDER DISLOCATION Right 1998  . CORONARY STENT PLACEMENT  April 2012   Diagonal lesion. Bare metal  . OLECRANON BURSECTOMY Left 09/20/2016   Procedure: EXCISION OLECRANON BURSA;  Surgeon: Cammy Copa, MD;  Location: [facility] OR;  Service: Orthopedics;  Laterality: Left;  . UMBILICAL HERNIA REPAIR      Family History:  Family History  Problem  Relation Age of Onset  . Heart attack Father   . Coronary artery disease Brother   . Heart attack Brother     Social History:  reports that he has been smoking Cigarettes.  He has been smoking about 0.25 packs per day. He has quit using smokeless tobacco. His smokeless tobacco use included Chew. He reports that he does not drink alcohol or use drugs.  Additional Social History:  Alcohol / Drug Use Pain Medications: please see mar Prescriptions: please see mar Over the Counter: please see mar  CIWA: CIWA-Ar BP: (!) 153/82 Pulse Rate: (!) 45 COWS:    PATIENT STRENGTHS: (choose at least two) Average or above average intelligence Communication skills  Allergies:  Allergies  Allergen Reactions  . Codeine Nausea And Vomiting    Home Medications:  (Not in a hospital admission)  OB/GYN Status:  No LMP for male patient.  General Assessment Data Location of Assessment: [facility] ED TTS Assessment: In system Is this a Tele or Face-to-Face Assessment?: Tele Assessment Is this an Initial Assessment or a Re-assessment for this encounter?: Initial Assessment Marital status: Single Maiden name: NA Is patient pregnant?: No Pregnancy Status: No Living Arrangements: Spouse/significant other Can pt return to current living arrangement?: Yes Admission Status: Voluntary Is patient capable of signing voluntary admission?: Yes Referral Source: Self/Family/Friend Insurance type: [employer]     Crisis Care Plan Living Arrangements: Spouse/significant other  Legal Guardian: Other: (self) Name of Psychiatrist: NA Name of Therapist: NA  Education Status Is patient currently in school?: No Current Grade: NA  Risk to self with the past 6 months Suicidal Ideation: Yes-Currently Present Has patient been a risk to self within the past 6 months prior to admission? : No Suicidal Intent: Yes-Currently Present Has patient had any suicidal intent within the past 6 months prior to  admission? : No Is patient at risk for suicide?: Yes Suicidal Plan?: Yes-Currently Present Has patient had any suicidal plan within the past 6 months prior to admission? : No Specify Current Suicidal Plan: Pt overdosed today Access to Means: Yes Specify Access to Suicidal Means: access to medication What has been your use of drugs/alcohol within the last 12 months?: NA Previous Attempts/Gestures: No How many times?: 0 Other Self Harm Risks: NA Triggers for Past Attempts: Other (Comment) (physical health) Intentional Self Injurious Behavior: None Family Suicide History: No Recent stressful life event(s): Other (Comment) (physical health) Persecutory voices/beliefs?: No Depression: Yes Depression Symptoms: Despondent, Insomnia, Tearfulness, Fatigue, Isolating, Guilt, Loss of interest in usual pleasures, Feeling worthless/self pity, Feeling angry/irritable Substance abuse history and/or treatment for substance abuse?: No Suicide prevention information given to non-admitted patients: Yes  Risk to Others within the past 6 months Homicidal Ideation: No Does patient have any lifetime risk of violence toward others beyond the six months prior to admission? : No Thoughts of Harm to Others: No Current Homicidal Intent: No Current Homicidal Plan: No Access to Homicidal Means: No Identified Victim: NA History of harm to others?: No Assessment of Violence: None Noted Violent Behavior Description: NA Does patient have access to weapons?: No Criminal Charges Pending?: No Does patient have a court date: No Is patient on probation?: No  Psychosis Hallucinations: None noted Delusions: None noted  Mental Status Report Appearance/Hygiene: Unremarkable Eye Contact: Fair Motor Activity: Freedom of movement Speech: Logical/coherent Level of Consciousness: Alert Mood: Depressed Affect: Depressed Anxiety Level: Moderate Thought Processes: Coherent, Relevant Judgement: Impaired Orientation:  Person, Place, Time, Situation, Appropriate for developmental age Obsessive Compulsive Thoughts/Behaviors: None  Cognitive Functioning Concentration: Normal Memory: Recent Intact, Remote Intact IQ: Average Insight: Fair Impulse Control: Fair Appetite: Fair Weight Loss: 0 Weight Gain: 0 Sleep: No Change Total Hours of Sleep: 8 Vegetative Symptoms: None  ADLScreening [facility] Assessment Services) Patient's cognitive ability adequate to safely complete daily activities?: Yes Patient able to express need for assistance with ADLs?: Yes Independently performs ADLs?: Yes (appropriate for developmental age)  Prior Inpatient Therapy Prior Inpatient Therapy: No Prior Therapy Dates: NA Prior Therapy Facilty/Provider(s): NA Reason for Treatment: NA  Prior Outpatient Therapy Prior Outpatient Therapy: No Prior Therapy Dates: NA Prior Therapy Facilty/Provider(s): NA Reason for Treatment: NA Does patient have an ACCT team?: No Does patient have Intensive In-House Services?  : No Does patient have [facility] services? : No Does patient have P4CC services?: No  ADL Screening (condition at time of admission) Patient's cognitive ability adequate to safely complete daily activities?: Yes Patient able to express need for assistance with ADLs?: Yes Independently performs ADLs?: Yes (appropriate for developmental age)             Merchant navy officerAdvance Directives (For Healthcare) Does Patient Have a Medical Advance Directive?: No    Additional Information 1:1 In Past 12 Months?: No CIRT Risk: No Elopement Risk: No Does patient have medical clearance?: Yes     Disposition:  Disposition Initial Assessment Completed for this Encounter: Yes Disposition of Patient: Inpatient treatment program Type  of inpatient treatment program: Adult  Emmit Pomfret 10/27/2016 6:03 PM

## 2016-10-27 NOTE — ED Notes (Signed)
Pt has made comments about only only accidentally taking one to many pills, but other times talks about he did not take enough pills "to do the job"

## 2016-10-27 NOTE — ED Notes (Signed)
Pt wanded by security, belongings inventoried and locked in locker and medications taken to pharmacy

## 2016-10-27 NOTE — ED Notes (Signed)
Pt eating dinner

## 2016-10-27 NOTE — BH Assessment (Signed)
[facility]   Clinician informed nurse Toni AmendCourtney that [facility] services of Dr. Elna BreslowEappen.  [facility].

## 2016-10-27 NOTE — ED Notes (Signed)
Family at the Bedside.

## 2016-10-28 ENCOUNTER — Encounter (HOSPITAL_COMMUNITY): Payer: Self-pay | Admitting: Emergency Medicine

## 2016-10-28 ENCOUNTER — Inpatient Hospital Stay (HOSPITAL_COMMUNITY)
Admission: AD | Admit: 2016-10-28 | Discharge: 2016-11-01 | DRG: 885 | Disposition: A | Discharge status: Home / Self Care | Payer: 59 | Attending: Psychiatry | Admitting: Psychiatry

## 2016-10-28 ENCOUNTER — Encounter (HOSPITAL_COMMUNITY): Payer: Self-pay | Admitting: *Deleted

## 2016-10-28 DIAGNOSIS — E785 Hyperlipidemia, unspecified: Secondary | ICD-10-CM | POA: Diagnosis not present

## 2016-10-28 DIAGNOSIS — F1290 Cannabis use, unspecified, uncomplicated: Secondary | ICD-10-CM | POA: Diagnosis not present

## 2016-10-28 DIAGNOSIS — Z79899 Other long term (current) drug therapy: Secondary | ICD-10-CM | POA: Diagnosis not present

## 2016-10-28 DIAGNOSIS — Z794 Long term (current) use of insulin: Secondary | ICD-10-CM

## 2016-10-28 DIAGNOSIS — Z955 Presence of coronary angioplasty implant and graft: Secondary | ICD-10-CM

## 2016-10-28 DIAGNOSIS — F332 Major depressive disorder, recurrent severe without psychotic features: Secondary | ICD-10-CM | POA: Diagnosis not present

## 2016-10-28 DIAGNOSIS — R45851 Suicidal ideations: Secondary | ICD-10-CM

## 2016-10-28 DIAGNOSIS — N529 Male erectile dysfunction, unspecified: Secondary | ICD-10-CM | POA: Diagnosis present

## 2016-10-28 DIAGNOSIS — Z7982 Long term (current) use of aspirin: Secondary | ICD-10-CM

## 2016-10-28 DIAGNOSIS — Z885 Allergy status to narcotic agent status: Secondary | ICD-10-CM | POA: Diagnosis not present

## 2016-10-28 DIAGNOSIS — K219 Gastro-esophageal reflux disease without esophagitis: Secondary | ICD-10-CM | POA: Diagnosis present

## 2016-10-28 DIAGNOSIS — I509 Heart failure, unspecified: Secondary | ICD-10-CM | POA: Diagnosis present

## 2016-10-28 DIAGNOSIS — I110 Hypertensive heart disease with heart failure: Secondary | ICD-10-CM | POA: Diagnosis present

## 2016-10-28 DIAGNOSIS — I252 Old myocardial infarction: Secondary | ICD-10-CM | POA: Diagnosis not present

## 2016-10-28 DIAGNOSIS — E1143 Type 2 diabetes mellitus with diabetic autonomic (poly)neuropathy: Secondary | ICD-10-CM | POA: Diagnosis present

## 2016-10-28 DIAGNOSIS — F1910 Other psychoactive substance abuse, uncomplicated: Secondary | ICD-10-CM | POA: Diagnosis not present

## 2016-10-28 DIAGNOSIS — E7800 Pure hypercholesterolemia, unspecified: Secondary | ICD-10-CM

## 2016-10-28 DIAGNOSIS — Z7951 Long term (current) use of inhaled steroids: Secondary | ICD-10-CM

## 2016-10-28 DIAGNOSIS — J449 Chronic obstructive pulmonary disease, unspecified: Secondary | ICD-10-CM | POA: Diagnosis present

## 2016-10-28 DIAGNOSIS — T426X2A Poisoning by other antiepileptic and sedative-hypnotic drugs, intentional self-harm, initial encounter: Secondary | ICD-10-CM | POA: Diagnosis present

## 2016-10-28 DIAGNOSIS — G4700 Insomnia, unspecified: Secondary | ICD-10-CM | POA: Diagnosis not present

## 2016-10-28 DIAGNOSIS — I2510 Atherosclerotic heart disease of native coronary artery without angina pectoris: Secondary | ICD-10-CM | POA: Diagnosis present

## 2016-10-28 DIAGNOSIS — F17210 Nicotine dependence, cigarettes, uncomplicated: Secondary | ICD-10-CM | POA: Diagnosis present

## 2016-10-28 DIAGNOSIS — F419 Anxiety disorder, unspecified: Secondary | ICD-10-CM | POA: Diagnosis present

## 2016-10-28 HISTORY — DX: Major depressive disorder, recurrent severe without psychotic features: F33.2

## 2016-10-28 LAB — GLUCOSE, CAPILLARY
Glucose-Capillary: 253 mg/dL — ABNORMAL HIGH (ref 65–99)
Glucose-Capillary: 142 mg/dL — ABNORMAL HIGH (ref 65–99)

## 2016-10-28 MED ORDER — INSULIN ASPART 100 UNIT/ML ~~LOC~~ SOLN
0.0000 [IU] | Freq: Three times a day (TID) | SUBCUTANEOUS | Status: DC
  Administered 2016-10-28: 5 [IU] via SUBCUTANEOUS
  Administered 2016-10-29: 2 [IU] via SUBCUTANEOUS
  Administered 2016-10-29: 7 [IU] via SUBCUTANEOUS
  Administered 2016-10-29: 2 [IU] via SUBCUTANEOUS
  Administered 2016-10-30: 1 [IU] via SUBCUTANEOUS
  Administered 2016-10-30 (×2): 3 [IU] via SUBCUTANEOUS
  Administered 2016-10-31: 2 [IU] via SUBCUTANEOUS
  Administered 2016-10-31 – 2016-11-01 (×3): 3 [IU] via SUBCUTANEOUS
  Administered 2016-11-01: 2 [IU] via SUBCUTANEOUS

## 2016-10-28 MED ORDER — MAGNESIUM HYDROXIDE 400 MG/5ML PO SUSP: 30.0000 mL | Freq: Every day | ORAL | Status: DC | PRN

## 2016-10-28 MED ORDER — METFORMIN HCL 500 MG PO TABS
1000.0000 mg | ORAL_TABLET | Freq: Two times a day (BID) | ORAL | Status: DC
  Administered 2016-10-28 – 2016-11-01 (×8): 1000 mg via ORAL
  Filled 2016-10-28 (×12): qty 2

## 2016-10-28 MED ORDER — NITROGLYCERIN 0.4 MG SL SUBL
0.4000 mg | SUBLINGUAL_TABLET | SUBLINGUAL | Status: DC | PRN
  Administered 2016-10-28 – 2016-10-31 (×3): 0.4 mg via SUBLINGUAL
  Filled 2016-10-28: qty 1

## 2016-10-28 MED ORDER — ATORVASTATIN CALCIUM 40 MG PO TABS
40.0000 mg | ORAL_TABLET | Freq: Every day | ORAL | Status: DC
  Administered 2016-10-28 – 2016-11-01 (×5): 40 mg via ORAL
  Filled 2016-10-28 (×7): qty 1

## 2016-10-28 MED ORDER — TRAZODONE HCL 50 MG PO TABS
50.0000 mg | ORAL_TABLET | Freq: Every evening | ORAL | Status: DC | PRN
  Administered 2016-10-30 – 2016-10-31 (×2): 50 mg via ORAL
  Filled 2016-10-28 (×3): qty 1

## 2016-10-28 MED ORDER — HYDROXYZINE HCL 25 MG PO TABS
25.0000 mg | ORAL_TABLET | Freq: Three times a day (TID) | ORAL | Status: DC | PRN
  Administered 2016-10-30 – 2016-10-31 (×2): 25 mg via ORAL
  Filled 2016-10-28 (×3): qty 1

## 2016-10-28 MED ORDER — BUPROPION HCL ER (XL) 150 MG PO TB24
150.0000 mg | ORAL_TABLET | Freq: Every day | ORAL | Status: DC
  Administered 2016-10-28 – 2016-11-01 (×5): 150 mg via ORAL
  Filled 2016-10-28 (×7): qty 1

## 2016-10-28 MED ORDER — INSULIN ASPART 100 UNIT/ML ~~LOC~~ SOLN
0.0000 [IU] | Freq: Every day | SUBCUTANEOUS | Status: DC
  Administered 2016-10-30: 2 [IU] via SUBCUTANEOUS

## 2016-10-28 MED ORDER — ACETAMINOPHEN 325 MG PO TABS: 650.0000 mg | ORAL_TABLET | Freq: Four times a day (QID) | ORAL | Status: DC | PRN

## 2016-10-28 MED ORDER — ALUM & MAG HYDROXIDE-SIMETH 200-200-20 MG/5ML PO SUSP: 30.0000 mL | ORAL | Status: DC | PRN

## 2016-10-28 MED ORDER — ALBUTEROL SULFATE HFA 108 (90 BASE) MCG/ACT IN AERS: 2.0000 | INHALATION_SPRAY | Freq: Four times a day (QID) | RESPIRATORY_TRACT | Status: DC | PRN

## 2016-10-28 NOTE — H&P (Signed)
Psychiatric Admission Assessment Adult  Patient Identification: [name] Newton MRN:  [id number] Date of Evaluation:  10/28/2016 Chief Complaint:  MDD Principal Diagnosis: MDD (major depressive disorder), recurrent severe, without psychosis ([place]) Diagnosis:   Patient Active Problem List   Diagnosis Date Noted  . MDD (major depressive disorder), recurrent severe, without psychosis ([place]) [F33.2] 10/28/2016    Priority: High  . Effusion of bursa of elbow, left [M25.422] 09/08/2016  . Diabetic autonomic neuropathy associated with type 2 diabetes mellitus ([place]) [E11.43] 07/06/2016  . Dyslipidemia [E78.5] 07/06/2016  . Rash [R21] 02/15/2016  . Stable angina ([place]) [I20.8] 02/15/2016  . Hyperlipidemia [E78.5] 11/23/2015  . Essential hypertension [I10] 07/26/2015  . Erectile dysfunction [N52.9] 07/26/2015  . Healthcare maintenance [Z00.00] 07/15/2014  . GERD (gastroesophageal reflux disease) [K21.9] 07/24/2013  . Tobacco abuse [Z72.0] 12/03/2012  . Fatty infiltration of liver [K76.0] 07/18/2012  . COPD (chronic obstructive pulmonary disease) ([place]) [J44.9] 07/04/2012  . Hypertriglyceridemia [E78.1]   . Coronary artery disease [I25.10]   . NSTEMI (non-ST elevated myocardial infarction) ([place]) [I21.4] 08/29/2010  . Diabetes type 2, uncontrolled with neuropathy [E11.65] 05/30/2010   History of Present Illness: Patient is a 56 year old male transferred from [facility] ED for stabilization and treatment of worsening of depression along with suicide attempt. Patient ingested a whole bottle of Lyrica in order to kill himself. Patient states that he does not know why he did it, regrets it and does not feel he needs to be in the hospital.  Patient reports that he's been struggling with depression for for 5 months now, adds that his depression has worsened over the last few weeks. On being questioned about stressors, patient reports that he is a 78-monthold baby, adds that his wife is 254years of  age and [place] but he does not have any. Patient states that he does not take his diabetic medications as prescribed, has multiple medical problems, does not have social support as his family does not talk to him. He feels that because of all the stressors he made a poor choice but adds that he wants to feel better, start medications and return back to his family as his wife does not drive. He also reports financial stressors and adds that he is on disability but states that it's hard to pay the bills.  Patient denies any history of previous psychiatric admission, reports he uses marijuana and smokes cigarettes but denies any other substance use.  On a scale of 0-10 with 0 being no symptoms in 10 being the worst, patient reports his depression is 8 out of 10. He adds that he can contract for safety on the unit, does not feel he will attempt again but does acknowledge that when he overdosed he was just overwhelmed. On being questioned if the stressors have changed, patient denies this. He does acknowledge he needs to learn to work on his coping mechanisms, needs to take his medications regularly so he does well. He adds that he needs to be there for his son was only 137 monthsold now.  Patient denies any psychotic Symptoms, any thoughts of hurting others, any anxiety, any history of trauma Associated Signs/Symptoms: Depression Symptoms:  depressed mood, fatigue, feelings of worthlessness/guilt, difficulty concentrating, hopelessness, suicidal attempt, loss of energy/fatigue, (Hypo) Manic Symptoms:  Impulsivity, Anxiety Symptoms:  Obsessive Compulsive Symptoms:   None,, Psychotic Symptoms:  Hallucinations: None PTSD Symptoms: Negative Total Time spent  with patient: 1 hour  Past Psychiatric History: Patient has not had any inpatient or outpatient treatment  Is the patient at risk to self? Yes.    Has the patient been a risk  to self in the past 6 months? Yes.    Has the patient been a risk to self within the distant past? No.  Is the patient a risk to others? No.  Has the patient been a risk to others in the past 6 months? No.  Has the patient been a risk to others within the distant past? No.   Prior Inpatient Therapy:   Prior Outpatient Therapy:    Alcohol Screening: 1. How often do you have a drink containing alcohol?: Never 9. Have you or someone else been injured as a result of your drinking?: No 10. Has a relative or friend or a doctor or another health worker been concerned about your drinking or suggested you cut down?: No Alcohol Use Disorder Identification Test Final Score (AUDIT): 0 Brief Intervention: AUDIT score less than 7 or less-screening does not suggest unhealthy drinking-brief intervention not indicated Substance Abuse History in the last 12 months:  Yes.   Consequences of Substance Abuse: Negative Previous Psychotropic Medications: No  Psychological Evaluations: No  Past Medical History:  Past Medical History:  Diagnosis Date  . Arthritis   . Asthma   . CHF (congestive heart failure) ([place])   . Complication of anesthesia 09/16/2016   in 1998 patient dislocated his shoulder and was told he should never be "put under" again because he was very difficult to awake from anesthesia. He is very concerned about this  . COPD (chronic obstructive pulmonary disease) ([place])   . Diabetes mellitus   . Guaiac positive stools     Guaiac positive stool with stable H and H.   . Hyperlipidemia   . Hypertension   . NSTEMI (non-ST elevated myocardial infarction) [facility]) April 2012   status post successful PCI and bare-metal  stenting of the first diagonal this admission.   . Tobacco abuse     Past Surgical History:  Procedure Laterality Date  . CARDIAC CATHETERIZATION     Normal EF. 90% diagonal lesion  . CLOSED REDUCTION SHOULDER DISLOCATION Right 1998  . CORONARY STENT PLACEMENT  April 2012    Diagonal lesion. Bare metal  . OLECRANON BURSECTOMY Left 09/20/2016   Procedure: EXCISION OLECRANON BURSA;  Surgeon: Meredith Pel, MD;  Location: [place];  Service: Orthopedics;  Laterality: Left;  . UMBILICAL HERNIA REPAIR     Family History:  Family History  Problem Relation Age of Onset  . Heart attack Father   . Coronary artery disease Brother   . Heart attack Brother    Family Psychiatric  History: None reported Tobacco Screening: Have you used any form of tobacco in the last 30 days? (Cigarettes, Smokeless Tobacco, Cigars, and/or Pipes): Yes Tobacco use, Select all that apply: 5 or more cigarettes per day Are you interested in Tobacco Cessation Medications?: No, patient refused Counseled patient on smoking cessation including recognizing danger situations, developing coping skills and basic information about quitting provided: Refused/Declined practical counseling Social History:  History  Alcohol Use No     History  Drug Use  . Types: Marijuana    Additional Social History:                           Allergies:   Allergies  Allergen Reactions  . Codeine Nausea  And Vomiting   Lab Results:  Results for orders placed or performed during the hospital encounter of 10/27/16 (from the past 48 hour(s))  Ethanol     Status: None   Collection Time: 10/27/16  2:52 PM  Result Value Ref Range   Alcohol, Ethyl (B) <5 <5 mg/dL    Comment:        LOWEST DETECTABLE LIMIT FOR SERUM ALCOHOL IS 5 mg/dL FOR MEDICAL PURPOSES ONLY   Acetaminophen level     Status: Abnormal   Collection Time: 10/27/16  2:52 PM  Result Value Ref Range   Acetaminophen (Tylenol), Serum <10 (L) 10 - 30 ug/mL    Comment:        THERAPEUTIC CONCENTRATIONS VARY SIGNIFICANTLY. A RANGE OF 10-30 ug/mL MAY BE AN EFFECTIVE CONCENTRATION FOR MANY PATIENTS. HOWEVER, SOME ARE BEST TREATED AT CONCENTRATIONS OUTSIDE THIS RANGE. ACETAMINOPHEN CONCENTRATIONS >150 ug/mL AT 4 HOURS AFTER INGESTION  AND >50 ug/mL AT 12 HOURS AFTER INGESTION ARE OFTEN ASSOCIATED WITH TOXIC REACTIONS.   Salicylate level     Status: None   Collection Time: 10/27/16  2:52 PM  Result Value Ref Range   Salicylate Lvl <5.4 2.8 - 30.0 mg/dL  CBC with Differential/Platelet     Status: None   Collection Time: 10/27/16  2:54 PM  Result Value Ref Range   WBC 8.5 4.0 - 10.5 K/uL   RBC 4.80 4.22 - 5.81 MIL/uL   Hemoglobin 14.1 13.0 - 17.0 g/dL   HCT 42.8 39.0 - 52.0 %   MCV 89.2 78.0 - 100.0 fL   MCH 29.4 26.0 - 34.0 pg   MCHC 32.9 30.0 - 36.0 g/dL   RDW 14.3 11.5 - 15.5 %   Platelets 289 150 - 400 K/uL   Neutrophils Relative % 61 %   Neutro Abs 5.1 1.7 - 7.7 K/uL   Lymphocytes Relative 30 %   Lymphs Abs 2.6 0.7 - 4.0 K/uL   Monocytes Relative 6 %   Monocytes Absolute 0.5 0.1 - 1.0 K/uL   Eosinophils Relative 3 %   Eosinophils Absolute 0.2 0.0 - 0.7 K/uL   Basophils Relative 0 %   Basophils Absolute 0.0 0.0 - 0.1 K/uL  Comprehensive metabolic panel     Status: Abnormal   Collection Time: 10/27/16  2:54 PM  Result Value Ref Range   Sodium 135 135 - 145 mmol/L   Potassium 3.8 3.5 - 5.1 mmol/L   Chloride 103 101 - 111 mmol/L   CO2 24 22 - 32 mmol/L   Glucose, Bld 174 (H) 65 - 99 mg/dL   BUN 15 6 - 20 mg/dL   Creatinine, Ser 0.85 0.61 - 1.24 mg/dL   Calcium 9.3 8.9 - 10.3 mg/dL   Total Protein 7.4 6.5 - 8.1 g/dL   Albumin 4.2 3.5 - 5.0 g/dL   AST 25 15 - 41 U/L   ALT 28 17 - 63 U/L   Alkaline Phosphatase 40 38 - 126 U/L   Total Bilirubin 0.5 0.3 - 1.2 mg/dL   GFR calc non Af Amer >60 >60 mL/min   GFR calc Af Amer >60 >60 mL/min    Comment: (NOTE) The eGFR has been calculated using the CKD EPI equation. This calculation has not been validated in all clinical situations. eGFR's persistently <60 mL/min signify possible Chronic Kidney Disease.    Anion gap 8 5 - 15  I-stat chem 8, ed     Status: Abnormal   Collection Time: 10/27/16  3:07 PM  Result  Value Ref Range   Sodium 136 135 - 145  mmol/L   Potassium 3.8 3.5 - 5.1 mmol/L   Chloride 105 101 - 111 mmol/L   BUN 19 6 - 20 mg/dL   Creatinine, Ser 0.80 0.61 - 1.24 mg/dL   Glucose, Bld 171 (H) 65 - 99 mg/dL   Calcium, Ion 1.03 (L) 1.15 - 1.40 mmol/L   TCO2 22 0 - 100 mmol/L   Hemoglobin 15.0 13.0 - 17.0 g/dL   HCT 44.0 39.0 - 52.0 %  I-Stat CG4 Lactic Acid, ED     Status: None   Collection Time: 10/27/16  3:07 PM  Result Value Ref Range   Lactic Acid, Venous 0.90 0.5 - 1.9 mmol/L  CBG monitoring, ED     Status: None   Collection Time: 10/27/16  3:59 PM  Result Value Ref Range   Glucose-Capillary 97 65 - 99 mg/dL  I-stat troponin, ED     Status: None   Collection Time: 10/27/16  4:05 PM  Result Value Ref Range   Troponin i, poc 0.00 0.00 - 0.08 ng/mL   Comment 3            Comment: Due to the release kinetics of cTnI, a negative result within the first hours of the onset of symptoms does not rule out myocardial infarction with certainty. If myocardial infarction is still suspected, repeat the test at appropriate intervals.   Rapid urine drug screen (hospital performed)     Status: Abnormal   Collection Time: 10/27/16  4:40 PM  Result Value Ref Range   Opiates NONE DETECTED NONE DETECTED   Cocaine NONE DETECTED NONE DETECTED   Benzodiazepines NONE DETECTED NONE DETECTED   Amphetamines NONE DETECTED NONE DETECTED   Tetrahydrocannabinol POSITIVE (A) NONE DETECTED   Barbiturates NONE DETECTED NONE DETECTED    Comment:        DRUG SCREEN FOR MEDICAL PURPOSES ONLY.  IF CONFIRMATION IS NEEDED FOR ANY PURPOSE, NOTIFY LAB WITHIN 5 DAYS.        LOWEST DETECTABLE LIMITS FOR URINE DRUG SCREEN Drug Class       Cutoff (ng/mL) Amphetamine      1000 Barbiturate      200 Benzodiazepine   263 Tricyclics       785 Opiates          300 Cocaine          300 THC              50   Urinalysis, Routine w reflex microscopic     Status: Abnormal   Collection Time: 10/27/16  4:40 PM  Result Value Ref Range   Color, Urine  YELLOW YELLOW   APPearance CLEAR CLEAR   Specific Gravity, Urine 1.012 1.005 - 1.030   pH 5.0 5.0 - 8.0   Glucose, UA 50 (A) NEGATIVE mg/dL   Hgb urine dipstick NEGATIVE NEGATIVE   Bilirubin Urine NEGATIVE NEGATIVE   Ketones, ur NEGATIVE NEGATIVE mg/dL   Protein, ur NEGATIVE NEGATIVE mg/dL   Nitrite NEGATIVE NEGATIVE   Leukocytes, UA NEGATIVE NEGATIVE    Blood Alcohol level:  Lab Results  Component Value Date   ETH <5 88/50/2774    Metabolic Disorder Labs:  Lab Results  Component Value Date   HGBA1C 9.0 10/04/2016   MPG 246 09/16/2016   MPG 197 (H) 09/22/2010   No results found for: PROLACTIN Lab Results  Component Value Date   CHOL 380 (H) 07/06/2016   TRIG 744 (HH)  07/06/2016   HDL 32 (L) 07/06/2016   CHOLHDL 11.9 (H) 07/06/2016   VLDL NOT CALC 12/15/2014   [place] Comment 07/06/2016   LDLCALC NOT CALC 12/15/2014    Current Medications: Current Facility-Administered Medications  Medication Dose Route Frequency Provider Last Rate Last Dose  . acetaminophen (TYLENOL) tablet 650 mg  650 mg Oral Q6H PRN Ethelene Hal, NP      . albuterol (PROVENTIL HFA;VENTOLIN HFA) 108 (90 Base) MCG/ACT inhaler 2 puff  2 puff Inhalation Q6H PRN Ethelene Hal, NP      . alum & mag hydroxide-simeth (MAALOX/MYLANTA) 200-200-20 MG/5ML suspension 30 mL  30 mL Oral Q4H PRN Ethelene Hal, NP      . atorvastatin (LIPITOR) tablet 40 mg  40 mg Oral Daily Ethelene Hal, NP      . buPROPion (WELLBUTRIN XL) 24 hr tablet 150 mg  150 mg Oral Daily Hampton Abbot, MD      . hydrOXYzine (ATARAX/VISTARIL) tablet 25 mg  25 mg Oral TID PRN Ethelene Hal, NP      . magnesium hydroxide (MILK OF MAGNESIA) suspension 30 mL  30 mL Oral Daily PRN Ethelene Hal, NP      . metFORMIN (GLUCOPHAGE) tablet 1,000 mg  1,000 mg Oral BID WC Ethelene Hal, NP      . traZODone (DESYREL) tablet 50 mg  50 mg Oral QHS PRN Ethelene Hal, NP       PTA  Medications: Prescriptions Prior to Admission  Medication Sig Dispense Refill Last Dose  . albuterol (PROAIR HFA) 108 (90 Base) MCG/ACT inhaler Inhale 2 puffs into the lungs every 6 (six) hours as needed for wheezing or shortness of breath. (Patient taking differently: Inhale 3-4 puffs into the lungs every 6 (six) hours as needed for wheezing or shortness of breath (3-4 puffs to help breathing). ) 1 Inhaler 2 Taking  . aspirin 81 MG chewable tablet Chew 243 mg by mouth daily. Takes 3 tablets daily   Taking  . Aspirin-Salicylamide-Caffeine (BC HEADACHE POWDER PO) Take 1 packet by mouth daily as needed (pain).   Taking  . atorvastatin (LIPITOR) 40 MG tablet Take 1 tablet (40 mg total) by mouth daily. 30 tablet 0 Taking  . Blood Glucose Monitoring Suppl (ACCU-CHEK AVIVA PLUS) w/Device KIT Use as directed 1 kit 2 Taking  . Fluticasone-Salmeterol (ADVAIR DISKUS) 100-50 MCG/DOSE AEPB Inhale 1 puff into the lungs 2 (two) times daily. 180 each 1 Taking  . gemfibrozil (LOPID) 600 MG tablet Take 600 mg by mouth 2 (two) times daily before a meal.   Taking  . glipiZIDE (GLUCOTROL) 5 MG tablet TAKE 1 TABLET BY MOUTH  DAILY BEFORE BREAKFAST (Patient taking differently: Take 5 mg by mouth daily before breakfast. ) 90 tablet 2 Taking  . glucose blood test strip Use to check blood sugar up to 1 times a day before  Meals Dx code- 250.00 100 each 3   . Insulin Glargine (LANTUS SOLOSTAR) 100 UNIT/ML Solostar Pen Inject 16 Units into the skin at bedtime. 5 pen 11 Taking  . Insulin Pen Needle (PEN NEEDLES 31GX5/16") 31G X 8 MM MISC Use to give insulin at bedtime. E11.65 30 each 3 Taking  . Lancets Misc. (ACCU-CHEK SOFTCLIX LANCET DEV) KIT 100 lancets Use to test blood glucose daily. E11.9 1 kit 3 Taking  . metFORMIN (GLUCOPHAGE) 1000 MG tablet Take 1 tablet (1,000 mg total) by mouth 2 (two) times daily with a meal. 180 tablet 3 Taking  .  metoprolol tartrate (LOPRESSOR) 25 MG tablet TAKE ONE-HALF TABLET BY  MOUTH TWO TIMES  DAILY (Patient taking differently: Take 12.5 mg by mouth 2 (two) times daily. ) 60 tablet 3 Taking  . nitroGLYCERIN (NITROSTAT) 0.4 MG SL tablet DISSOLVE 1 TABLET UNDER THE TONGUE EVERY 5 MINUTES AS  NEEDED FOR CHEST PAIN 100 tablet 0 Taking  . pregabalin (LYRICA) 50 MG capsule Take 1 capsule (50 mg total) by mouth 3 (three) times daily. (Patient taking differently: Take 50 mg by mouth at bedtime as needed (pain). ) 270 capsule 3 Taking    Musculoskeletal: Strength & Muscle Tone: within normal limits Gait & Station: normal Patient leans: N/A  Psychiatric Specialty Exam: Physical Exam  Review of Systems  Constitutional: Positive for malaise/fatigue. Negative for fever.  HENT: Negative for congestion, hearing loss and sore throat.   Eyes: Negative.  Negative for blurred vision, double vision, discharge and redness.  Respiratory: Negative.  Negative for cough, shortness of breath and wheezing.   Cardiovascular: Negative.  Negative for chest pain, palpitations and leg swelling.  Gastrointestinal: Negative.  Negative for abdominal pain, constipation, diarrhea, heartburn, nausea and vomiting.  Genitourinary: Negative.  Negative for dysuria.  Musculoskeletal: Negative.  Negative for falls and myalgias.  Skin: Negative.  Negative for rash.  Neurological: Positive for tingling and sensory change. Negative for dizziness, seizures, loss of consciousness, weakness and headaches.  Endo/Heme/Allergies: Negative.  Negative for environmental allergies.  Psychiatric/Behavioral: Positive for depression, substance abuse and suicidal ideas. Negative for hallucinations and memory loss. The patient is not nervous/anxious and does not have insomnia.     Blood pressure (!) 147/79, pulse 90, temperature 98.5 F (36.9 C), temperature source Oral, resp. rate 18, height 6' 4"  (1.93 m), weight 85.3 kg (188 lb), SpO2 100 %.Body mass index is 22.88 kg/m.  General Appearance: Disheveled  Eye Contact:  Fair  Speech:   Clear and Coherent and Normal Rate  Volume:  Decreased  Mood:  Depressed and Dysphoric  Affect:  Congruent and Restricted  Thought Process:  Coherent, Goal Directed and Descriptions of Associations: Intact  Orientation:  Full (Time, Place, and Person)  Thought Content:  Logical and Rumination  Suicidal Thoughts:  Yes.  with intent/plan  Homicidal Thoughts:  No  Memory:  Immediate;   Fair Recent;   Fair Remote;   Fair  Judgement:  Poor  Insight:  Lacking  Psychomotor Activity:  Mannerisms  Concentration:  Concentration: Fair and Attention Span: Fair  Recall:  [employer] of Knowledge:  Fair  Language:  Fair  Akathisia:  No  Handed:  Right  AIMS (if indicated):     Assets:  Desire for Improvement Housing Social Support  ADL's:  Intact  Cognition:  WNL  Sleep:       Treatment Plan Summary: Daily contact with patient to assess and evaluate symptoms and progress in treatment and Medication management  Observation Level/Precautions:  15 minute checks  Laboratory:  Labs reviewed patient's glucose is elevated, patient's UDS is positive for cannabis  Psychotherapy:  Patient to participate in therapeutic milieu and group therapy   Medications:  To restart patient's metformin thousand milligrams twice daily To start trazodone 50 mg 1 at bedtime as needed for sleep To start Wellbutrin XL 150 mg 1 in the morning to help her depression. The risks and benefits along with the side effects were discussed with patient and he was agreeable with this plan.  To continue Lipitor 40 mg 1 daily for patient's high cholesterol To  continue hydroxyzine 25 mg 3 times daily as needed for anxiety   Consultations:  None at this time   Discharge Concerns:  The need for patient to be compliant with medication management along with outpatient follow-up. Discussed this in length with patient. Also the need for patient to safely and effectively participate in outpatient treatment   Estimated LOS:3 days    Other:     Physician Treatment Plan for Primary Diagnosis: MDD (major depressive disorder), recurrent severe, without psychosis ([place]) Long Term Goal(s): Improvement in symptoms so as ready for discharge  Short Term Goals: Ability to verbalize feelings will improve, Ability to disclose and discuss suicidal ideas, Ability to demonstrate self-control will improve and Ability to identify and develop effective coping behaviors will improve  Physician Treatment Plan for Secondary Diagnosis: Principal Problem:   MDD (major depressive disorder), recurrent severe, without psychosis ([place])  Long Term Goal(s): Improvement in symptoms so as ready for discharge  Short Term Goals: Ability to maintain clinical measurements within normal limits will improve, Compliance with prescribed medications will improve and Ability to identify triggers associated with substance abuse/mental health issues will improve  I certify that inpatient services furnished can reasonably be expected to improve the patient's condition.    Hampton Abbot, MD 6/1/20182:13 PM

## 2016-10-28 NOTE — ED Provider Notes (Signed)
[facility] by Dr. Lucianne MussKumar. Pt assessed prior at 9:51 AM. No acute changes. Safe for transfer.   Nira Connardama, Kamie Korber Eduardo, MD 10/28/16 [phone]

## 2016-10-28 NOTE — Progress Notes (Signed)
Report received from admitting nurse. Patient denies SI/HI/AVH and pain at this time. Orders received and acknowledged.  Medications administered as per order. Patient verbally contracts for safety on the unit and agrees to come to staff before acting on any self harm thoughts/feelings and other needs or concerns. 15 min checks initiated for safety and patient oriented to the unit and the unit schedule. 

## 2016-10-28 NOTE — Plan of Care (Signed)
Problem: Safety: Goal: Periods of time without injury will increase Outcome: Progressing Pt. denies SI/HI/AVH at this time, remains a low fall risk, Q 15 checks in effect.    

## 2016-10-28 NOTE — ED Notes (Signed)
Pt is alert, states he misses his baby and wife-- has a 5month old-- states that his wife is 57 y/o and does not drive. States he needs to take care of them.

## 2016-10-28 NOTE — ED Notes (Signed)
Pt states he wants to go home, talking on phone with wife.

## 2016-10-28 NOTE — [facility] Suicide Risk Assessment (Signed)
[facility]   Nursing information obtained from:  Patient Demographic factors:  Male, Caucasian, Low socioeconomic status, Unemployed Current Mental Status:  Suicidal ideation indicated by patient Loss Factors:  NA Historical Factors:  NA Risk Reduction Factors:  Responsible for children under 57 years of age, Living with another person, especially a relative  Total Time spent with patient: 1 hour Principal Problem: MDD (major depressive disorder), recurrent severe, without psychosis (HCC) Diagnosis:   Patient Active Problem List   Diagnosis Date Noted  . MDD (major depressive disorder), recurrent severe, without psychosis (HCC) [F33.2] 10/28/2016    Priority: High  . Effusion of bursa of elbow, left [M25.422] 09/08/2016  . Diabetic autonomic neuropathy associated with type 2 diabetes mellitus (HCC) [E11.43] 07/06/2016  . Dyslipidemia [E78.5] 07/06/2016  . Rash [R21] 02/15/2016  . Stable angina (HCC) [I20.8] 02/15/2016  . Hyperlipidemia [E78.5] 11/23/2015  . Essential hypertension [I10] 07/26/2015  . Erectile dysfunction [N52.9] 07/26/2015  . Healthcare maintenance [Z00.00] 07/15/2014  . GERD (gastroesophageal reflux disease) [K21.9] 07/24/2013  . Tobacco abuse [Z72.0] 12/03/2012  . Fatty infiltration of liver [K76.0] 07/18/2012  . COPD (chronic obstructive pulmonary disease) (HCC) [J44.9] 07/04/2012  . Hypertriglyceridemia [E78.1]   . Coronary artery disease [I25.10]   . NSTEMI (non-ST elevated myocardial infarction) (HCC) [I21.4] 08/29/2010  . Diabetes type 2, uncontrolled with neuropathy [E11.65] 05/30/2010   Subjective Data: Patient is a 56 year old male admitted for worsening of depression along with suicide attempt by overdose on a bottle of Lyrica in order to kill himself  Continued Clinical Symptoms:  Alcohol Use Disorder Identification Test Final Score (AUDIT): 0 The "Alcohol Use Disorders Identification Test", Guidelines for Use in Primary  Care, Second Edition.  World Science writerHealth Organization [facility](WHO). Score between 0-7:  no or low risk or alcohol related problems. Score between 8-15:  moderate risk of alcohol related problems. Score between 16-19:  high risk of alcohol related problems. Score 20 or above:  warrants further diagnostic evaluation for alcohol dependence and treatment.   CLINICAL FACTORS:   Depression:   Hopelessness Impulsivity Severe Medical Diagnoses and Treatments/Surgeries   Musculoskeletal: Strength & Muscle Tone: within normal limits Gait & Station: normal Patient leans: N/A  Psychiatric Specialty Exam: Physical Exam  ROS  Blood pressure (!) 147/79, pulse 90, temperature 98.5 F (36.9 C), temperature source Oral, resp. rate 18, height 6\' 4"  (1.93 m), weight 85.3 kg (188 lb), SpO2 100 %.Body mass index is 22.88 kg/m.    COGNITIVE FEATURES THAT CONTRIBUTE TO RISK:  Polarized thinking and Thought constriction (tunnel vision)    SUICIDE RISK:   Severe:  Frequent, intense, and enduring suicidal ideation, specific plan, no subjective intent, but some objective markers of intent (i.e., choice of lethal method), the method is accessible, some limited preparatory behavior, evidence of impaired self-control, severe dysphoria/symptomatology, multiple risk factors present, and few if any protective factors, particularly a lack of social support.  PLAN OF CARE: Please see H&P for details  I certify that inpatient services furnished can reasonably be expected to improve the patient's condition.   Nelly RoutKUMAR,Sheldon Sem, MD 10/28/2016, 2:34 PM

## 2016-10-28 NOTE — Tx Team (Signed)
Initial Treatment Plan 10/28/2016 1:50 PM Clarisa FlingChristopher M Antonini LGX:[id number]    PATIENT STRESSORS: Financial difficulties Health problems Medication change or noncompliance   PATIENT STRENGTHS: Ability for insight Average or above average intelligence General fund of knowledge Supportive family/friends   PATIENT IDENTIFIED PROBLEMS: SI  depression                   DISCHARGE CRITERIA:  Ability to meet basic life and health needs Adequate post-discharge living arrangements Improved stabilization in mood, thinking, and/or behavior Medical problems require only outpatient monitoring Motivation to continue treatment in a less acute level of care Need for constant or close observation no longer present Reduction of life-threatening or endangering symptoms to within safe limits Safe-care adequate arrangements made Verbal commitment to aftercare and medication compliance  PRELIMINARY DISCHARGE PLAN: Outpatient therapy Return to previous living arrangement  PATIENT/FAMILY INVOLVEMENT: This treatment plan has been presented to and reviewed with the patient, Clarisa FlingChristopher M Sponsel, and/or family member, .  The patient and family have been given the opportunity to ask questions and make suggestions.  Beatrix ShipperWright, Maraki Macquarrie Martin, RN 10/28/2016, 1:50 PM

## 2016-10-28 NOTE — Progress Notes (Signed)
Pt admitted voluntary following an OD on approximately 90 Lyrica. Pt is married to a 57 yr old wife and has a 8510 month old baby. Pt has multiple health issues including a hx of MI, HTN, diabetes, arthritis, CHF, asthma, COPD and a stint. Pt was tearful and irritable on admission, resistant to treatment and wanting to leave. Pt says that he is concerned about his wife at home that does not drive and has bipolar disorder. Pt has not been taking his medication reporting that he cannot afford them. Pt was positive for THC.

## 2016-10-28 NOTE — Progress Notes (Signed)
Patient reported to nurse that, "My blood sugars have been in the 1000's. I have been having a hard time getting them under control. NP notified and AC & HS CBG's with insulin coverage ordered. While nurse was talking with NP, patient reported chest [pain. Patient has a cardiac HX, and carries nitro with him. VS taken, nitro administered @ 5:21pm. NP and AC notified. Chest pain relieved. Some diaphoresis observed. Patient stated that he would like to go to dinner with peers,  "I would really like to walk down there." Per NP, patient allowed down to meal and sliding scale insulin held until after meal (based on pre meal result). Denies chest pain throughout meal and for the rest of the shift.

## 2016-10-28 NOTE — ED Notes (Addendum)
[id number] accepting Md Dr.Eappen; Pt is IVC therefore will need Sheriff transport

## 2016-10-29 LAB — GLUCOSE, CAPILLARY
Glucose-Capillary: 166 mg/dL — ABNORMAL HIGH (ref 65–99)
Glucose-Capillary: 162 mg/dL — ABNORMAL HIGH (ref 65–99)
Glucose-Capillary: 341 mg/dL — ABNORMAL HIGH (ref 65–99)
Glucose-Capillary: 168 mg/dL — ABNORMAL HIGH (ref 65–99)

## 2016-10-29 NOTE — Progress Notes (Signed)
   10/28/16 2100  Vital Signs  Temp 98.7 F (37.1 C)  Temp Source Oral  Pulse Rate (!) 119  Pulse Rate Source Dinamap  Resp 18  BP 118/85  BP Location Right Arm  BP Method Automatic  Patient Position (if appropriate) Sitting    Pt. reports on and off pain of chest. Vitals obtained. Pt. reports "It went away now". Will continue to monitor. Probably anxiety related? PRN Nitroglycerin ordered if s/s persist. Pt. is aware to notified writer of changes.  Will monitor closely.

## 2016-10-29 NOTE — Progress Notes (Addendum)
  DATA ACTION RESPONSE  Objective- Pt. is visible in the dayroom, seen watching TV and eating a snack. Presents with an anxious/worried affect and mood. Pt. appears preoccupied about wanting to be d/c in order to take care of family. Appears to be having racing thoughts and increase anxiety of unknown origin per Pt.  Subjective- Denies having any SI/HI/AVH/Pain at this time. Pt. states " I am weary of taking sleep medications because my sister passed away in her sleep". Needs reassurance. Remains safe on the unit .  1:1 interaction in private to establish rapport. Encouragement, education, & support given from staff. BS at bedtime was 142.   Safety maintained with Q 15 checks. Continue with POC.

## 2016-10-29 NOTE — Progress Notes (Signed)
[facility] MD Progress Note  10/29/2016 3:40 PM [name] Newton  MRN:  [id number] Subjective:  Im doing better,   Per nursing: Pt. is visible in the dayroom, seen watching TV and eating a snack. Presents with an anxious/worried affect and mood. Pt. appears preoccupied about wanting to be d/c in order to take care of family. Appears to be having racing thoughts and increase anxiety of unknown origin per Pt.    Objective: Case discussed during treatment team  and chart reviewed. During this evaluation patient remains alert and oriented x3, calm, and cooperative. [name] continues to show improvement in treatment as good response to current medication  po daily for depression management. He reports this medication is well tolerated with adverse effects.  [name] continues to exhibit symptoms of depression although he notes overall improvement since his admission. Sleep and eating patterns remains unchanged without difficulty. No irritability noted or reported and patient continues to engage well with both peers and staff. He continues to refute any active or passive suicidal thoughts  At current, he is able to contract for safety on the unit.       Principal Problem: MDD (major depressive disorder), recurrent severe, without psychosis (HCC) Diagnosis:   Patient Active Problem List   Diagnosis Date Noted  . MDD (major depressive disorder), recurrent severe, without psychosis (HCC) [F33.2] 10/28/2016  . Effusion of bursa of elbow, left [M25.422] 09/08/2016  . Diabetic autonomic neuropathy associated with type 2 diabetes mellitus (HCC) [E11.43] 07/06/2016  . Dyslipidemia [E78.5] 07/06/2016  . Rash [R21] 02/15/2016  . Stable angina (HCC) [I20.8] 02/15/2016  . Hyperlipidemia [E78.5] 11/23/2015  . Essential hypertension [I10] 07/26/2015  . Erectile dysfunction [N52.9] 07/26/2015  . Healthcare maintenance [Z00.00] 07/15/2014  . GERD (gastroesophageal reflux disease) [K21.9] 07/24/2013  .  Tobacco abuse [Z72.0] 12/03/2012  . Fatty infiltration of liver [K76.0] 07/18/2012  . COPD (chronic obstructive pulmonary disease) (HCC) [J44.9] 07/04/2012  . Hypertriglyceridemia [E78.1]   . Coronary artery disease [I25.10]   . NSTEMI (non-ST elevated myocardial infarction) (HCC) [I21.4] 08/29/2010  . Diabetes type 2, uncontrolled with neuropathy [E11.65] 05/30/2010   Total Time spent with patient: 20 minutes  Past Psychiatric History:Patient has not had any inpatient or outpatient treatment  Past Medical History:  Past Medical History:  Diagnosis Date  . Arthritis   . Asthma   . CHF (congestive heart failure) (HCC)   . Complication of anesthesia 09/16/2016   in 1998 patient dislocated his shoulder and was told he should never be "put under" again because he was very difficult to awake from anesthesia. He is very concerned about this  . COPD (chronic obstructive pulmonary disease) (HCC)   . Diabetes mellitus   . Guaiac positive stools     Guaiac positive stool with stable H and H.   . Hyperlipidemia   . Hypertension   . NSTEMI (non-ST elevated myocardial infarction) [facility]) April 2012   status post successful PCI and bare-metal  stenting of the first diagonal this admission.   . Tobacco abuse     Past Surgical History:  Procedure Laterality Date  . CARDIAC CATHETERIZATION     Normal EF. 90% diagonal lesion  . CLOSED REDUCTION SHOULDER DISLOCATION Right 1998  . CORONARY STENT PLACEMENT  April 2012   Diagonal lesion. Bare metal  . OLECRANON BURSECTOMY Left 09/20/2016   Procedure: EXCISION OLECRANON BURSA;  Surgeon: Cammy Copa, MD;  Location[facility] OR;  Service: Orthopedics;  Laterality: Left;  . UMBILICAL HERNIA REPAIR  Family History:  Family History  Problem Relation Age of Onset  . Heart attack Father   . Coronary artery disease Brother   . Heart attack Brother    Family Psychiatric  History:  Social History:  History  Alcohol Use No     History  Drug Use   . Types: Marijuana    Social History   Social History  . Marital status: Divorced    Spouse name: N/A  . Number of children: N/A  . Years of education: 10   Occupational History  . none     Unemployed since 2012   Social History Main Topics  . Smoking status: Current Some Day Smoker    Packs/day: 2.00    Types: Cigarettes  . Smokeless tobacco: Former Neurosurgeon    Types: Chew     Comment: Patches do not work.  Cutting back.  Wants Chantix  . Alcohol use No  . Drug use: Yes    Types: Marijuana  . Sexual activity: Yes    Birth control/ protection: None   Other Topics Concern  . None   Social History Narrative   Patient is intermittently homeless without any income at the moment.          Additional Social History:     Sleep: Good  Appetite:  Good  Current Medications: Current Facility-Administered Medications  Medication Dose Route Frequency Provider Last Rate Last Dose  . acetaminophen (TYLENOL) tablet 650 mg  650 mg Oral Q6H PRN Laveda Abbe, NP      . albuterol (PROVENTIL HFA;VENTOLIN HFA) 108 (90 Base) MCG/ACT inhaler 2 puff  2 puff Inhalation Q6H PRN Laveda Abbe, NP      . alum & mag hydroxide-simeth (MAALOX/MYLANTA) 200-200-20 MG/5ML suspension 30 mL  30 mL Oral Q4H PRN Laveda Abbe, NP      . atorvastatin (LIPITOR) tablet 40 mg  40 mg Oral Daily Laveda Abbe, NP   40 mg at 10/29/16 1610  . buPROPion (WELLBUTRIN XL) 24 hr tablet 150 mg  150 mg Oral Daily Nelly Rout, MD   150 mg at 10/29/16 9604  . hydrOXYzine (ATARAX/VISTARIL) tablet 25 mg  25 mg Oral TID PRN Laveda Abbe, NP      . insulin aspart (novoLOG) injection 0-5 Units  0-5 Units Subcutaneous QHS Laveda Abbe, NP      . insulin aspart (novoLOG) injection 0-9 Units  0-9 Units Subcutaneous TID WC Laveda Abbe, NP   2 Units at 10/29/16 1157  . magnesium hydroxide (MILK OF MAGNESIA) suspension 30 mL  30 mL Oral Daily PRN Laveda Abbe,  NP      . metFORMIN (GLUCOPHAGE) tablet 1,000 mg  1,000 mg Oral BID WC Laveda Abbe, NP   1,000 mg at 10/29/16 0824  . nitroGLYCERIN (NITROSTAT) SL tablet 0.4 mg  0.4 mg Sublingual Q5 min PRN Cobos, Rockey Situ, MD   0.4 mg at 10/28/16 1721  . traZODone (DESYREL) tablet 50 mg  50 mg Oral QHS PRN Laveda Abbe, NP        Lab Results:  Results for orders placed or performed during the hospital encounter of 10/28/16 (from the past 48 hour(s))  Glucose, capillary     Status: Abnormal   Collection Time: 10/28/16  5:16 PM  Result Value Ref Range   Glucose-Capillary 253 (H) 65 - 99 mg/dL  Glucose, capillary     Status: Abnormal   Collection Time: 10/28/16  9:10 PM  Result Value Ref Range   Glucose-Capillary 142 (H) 65 - 99 mg/dL  Glucose, capillary     Status: Abnormal   Collection Time: 10/29/16  5:54 AM  Result Value Ref Range   Glucose-Capillary 166 (H) 65 - 99 mg/dL   Comment 1 Notify RN   Glucose, capillary     Status: Abnormal   Collection Time: 10/29/16 11:53 AM  Result Value Ref Range   Glucose-Capillary 162 (H) 65 - 99 mg/dL    Blood Alcohol level:  Lab Results  Component Value Date   ETH <5 10/27/2016    Metabolic Disorder Labs: Lab Results  Component Value Date   HGBA1C 9.0 10/04/2016   MPG 246 09/16/2016   MPG 197 (H) 09/22/2010   No results found for: PROLACTIN Lab Results  Component Value Date   CHOL 380 (H) 07/06/2016   TRIG 744 (HH) 07/06/2016   HDL 32 (L) 07/06/2016   CHOLHDL 11.9 (H) 07/06/2016   VLDL NOT CALC 12/15/2014   LDLCALC Comment 07/06/2016   LDLCALC NOT CALC 12/15/2014    Physical Findings: AIMS: Facial and Oral Movements Muscles of Facial Expression: None, normal Lips and Perioral Area: None, normal Jaw: None, normal Tongue: None, normal,Extremity Movements Upper (arms, wrists, hands, fingers): None, normal Lower (legs, knees, ankles, toes): None, normal, Trunk Movements Neck, shoulders, hips: None, normal, Overall  Severity Severity of abnormal movements (highest score from questions above): None, normal Incapacitation due to abnormal movements: None, normal Patient's awareness of abnormal movements (rate only patient's report): No Awareness, Dental Status Current problems with teeth and/or dentures?: Yes Does patient usually wear dentures?: No  CIWA:    COWS:     Musculoskeletal: Strength & Muscle Tone: within normal limits Gait & Station: normal Patient leans: N/A  Psychiatric Specialty Exam: Physical Exam  ROS  Blood pressure 105/79, pulse 82, temperature 98.5 F (36.9 C), temperature source Oral, resp. rate 18, height 6\' 4"  (1.93 m), weight 85.3 kg (188 lb), SpO2 100 %.Body mass index is 22.88 kg/m.  General Appearance: Guarded  Eye Contact:  Minimal  Speech:  Clear and Coherent and Normal Rate  Volume:  Normal  Mood:  Depressed  Affect:  Depressed and Flat  Thought Process:  Linear and Descriptions of Associations: Intact  Orientation:  Full (Time, Place, and Person)  Thought Content:  Logical and Rumination  Suicidal Thoughts:  No  Homicidal Thoughts:  No  Memory:  Immediate;   Fair Recent;   Fair  Judgement:  Impaired  Insight:  Lacking  Psychomotor Activity:  Normal and EPS  Concentration:  Concentration: Fair and Attention Span: Fair  Recall:  [employer] of Knowledge:  Fair  Language:  Fair  Akathisia:  No  Handed:  Right  AIMS (if indicated):     Assets:  Communication Skills  ADL's:  Intact  Cognition:  WNL  Sleep:  Number of Hours: 6.25     Treatment Plan Summary: Daily contact with patient to assess and evaluate symptoms and progress in treatment and Medication management    To restart patient's metformin thousand milligrams twice daily To start trazodone 50 mg 1 at bedtime as needed for sleep To start Wellbutrin XL 150 mg 1 in the morning to help her depression. The risks and benefits along with the side effects were discussed with patient and he was  agreeable with this plan.  To continue Lipitor 40 mg 1 daily for patient's high cholesterol To continue hydroxyzine 25 mg 3 times daily as needed  for anxiety Truman Haywardakia S Starkes, FNP 10/29/2016, 3:40 PM

## 2016-10-29 NOTE — [facility] Suicide Risk Assessment (Signed)
[facility] INPATIENT:  Family/Significant Other Suicide Prevention Education  Suicide Prevention Education:  Patient Refusal for Family/Significant Other Suicide Prevention Education: The patient [name] Newton has refused to provide written consent for family/significant other to be provided Family/Significant Other Suicide Prevention Education during admission and/or prior to discharge.  Physician notified.  [name] Newton 10/29/2016, 3:51 PM

## 2016-10-29 NOTE — [facility] Group Notes (Signed)
Identifying Needs   Date:  10/29/2016  Time:  1300  Type of Therapy:  Nurse Education /  The group focuses on teachiing patients how to identify their needs and then how to develop skills needed t get them met.   Participation Level:  Active  Participation Quality:  Attentive  Affect:  Depressed  Cognitive:  Disorganized  Insight:  Limited  Engagement in Group:  Lacking  Modes of Intervention:  Discussion and Education  Summary of Progress/Problems:  Lauralyn Primes 10/29/2016, 5:47 PM
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
## 2016-10-29 NOTE — Progress Notes (Signed)
Patient ID: [name] FlingChristopher M Eliasen, male   DOB: September 11, 1960, 56 y.o.   MRN: [id number]    D: Pt has been very flat and depressed on the unit today, he reported that his wife was trying to take his son to [place]. Pt reported that he no longer wanted any phone calls from his wife because she was stressing him out. Pt then came back to staff and reported that his wife was bipolar and that he fixed things over the phone and that now he would take her phone calls. Pt reported that his depression was a 2, his hopelessness was a 0, and his anxiety was a 0. Pt reported that his goal for today was to do whatever it took to get home. Pt reported being negative SI/HI, no AH/VH noted. A: 15 min checks continued for patient safety. R: Pt safety maintained.

## 2016-10-29 NOTE — Progress Notes (Signed)
Patient attended group and said that his day as a 8.  Patient  said he was just happy to be alive.

## 2016-10-29 NOTE — [facility] Group Notes (Signed)
Adult Therapy Group Note (Clinical Social Work)  Date:  10/29/2016  Time:  10:00-11:00AM  Group Topic/Focus:  HEALTHY COPING SKILLS  Today's group focused on identifying healthy coping skills already in use by each patient, as well as healthy coping skills they would like to learn.  There was much sharing, support, psychoeducation, and encouragement provided.  Participation Level:  Active  Participation Quality:  Attentive and Sharing  Affect:  Blunted and Irritable  Cognitive:  Appropriate  Insight: Improving  Engagement in Group:  Developing/Improving  Modes of Intervention:  Discussion, Support and Processing  Additional Comments:  The patient shared that healthy coping already used is reading books, staying busy and not letting his mind be idle.  He stated he gets 4 hours of sleep, is scared he will die in his sleep because that happened to his sister and heart problems run in his family.  He did not seem able to see that additional sleep could help his mood.  He talked frequently about his 1310 month old baby.  Carloyn JaegerMareida J Grossman-Orr 10/29/2016, 1:28 PM

## 2016-10-30 LAB — GLUCOSE, CAPILLARY
Glucose-Capillary: 140 mg/dL — ABNORMAL HIGH (ref 65–99)
Glucose-Capillary: 222 mg/dL — ABNORMAL HIGH (ref 65–99)
Glucose-Capillary: 242 mg/dL — ABNORMAL HIGH (ref 65–99)
Glucose-Capillary: 234 mg/dL — ABNORMAL HIGH (ref 65–99)

## 2016-10-30 LAB — LIPID PANEL
Cholesterol: 225 mg/dL — ABNORMAL HIGH (ref 0–200)
HDL: 35 mg/dL — ABNORMAL LOW (ref >40)
LDL Cholesterol: 147 mg/dL — ABNORMAL HIGH (ref 0–99)
Total CHOL/HDL Ratio: 6.4 RATIO
Triglycerides: 217 mg/dL — ABNORMAL HIGH (ref <150)
VLDL: 43 mg/dL — ABNORMAL HIGH (ref 0–40)

## 2016-10-30 LAB — TSH: TSH: 6.147 u[IU]/mL — ABNORMAL HIGH (ref 0.350–4.500)

## 2016-10-30 NOTE — [facility] Group Notes (Signed)
[facility] Group Notes:  (Clinical Social Work)   10/30/2016    1:15-2:15PM  Summary of Progress/Problems:   The main focus of today's process group was to   1)  discuss the importance of adding supports  2)  define health supports versus unhealthy supports  4)  identify the patient's current healthy supports and  4)  plan what healthy supports to add.  An emphasis was placed on using counselor, doctor, therapy groups, 12-step groups, and problem-specific support groups to expand supports.    The patient expressed full comprehension of the concepts presented, and agreed that there is a need to add more supports.  The patient stated his wife and 4910 month old baby are supports that are healthy for him right now, and said he is sharing more now with his wife about how he feels than he ever has, continues to keep doing the same.  He also said he intends to stay busy with positive activities, and plans to include his brother more in talking about what he experiences.  Since being in the hospital, he has discovered that his sister was here in the past and had some of the same issues, which he had never previously been told, which made him feel more isolated.  Now he wants to include others and not be so isolated.  Type of Therapy:  Process Group with Motivational Interviewing  Participation Level:  Active  Participation Quality:  Attentive, Sharing and Supportive  Affect:  Appropriate  Cognitive:  Appropriate  Insight:  Developing/Improving  Engagement in Therapy:  Engaged  Modes of Intervention:   Education, Support and Processing  Ambrose MantleMareida Grossman-Orr, LCSW 10/30/2016    4:54 PM

## 2016-10-30 NOTE — Progress Notes (Signed)
Patient attended group and said that his day was a 8.  His coping skills for today were reading, coloring and socializing.

## 2016-10-30 NOTE — Plan of Care (Signed)
Problem: Activity: Goal: Sleeping patterns will improve Outcome: Not Progressing Pt slept only 4.5 hours, which is a reduction from the prior night.   Problem: Coping: Goal: Ability to verbalize frustrations and anger appropriately will improve Outcome: Progressing Pt says his goal is "just getting through the day without argument." So far, he appears to have been successful.  Problem: Safety: Goal: Periods of time without injury will increase Outcome: Progressing Pt remains safe on the unit.

## 2016-10-30 NOTE — Progress Notes (Signed)
D: Pt was in the hallway upon initial approach.  Pt presents with depressed affect and mood.  Pt reports his goal is to "stay out of trouble."  Pt denies SI/HI, denies hallucinations, denies pain.  Pt has been visible in milieu interacting with peers and staff appropriately.    A: Introduced self to pt.  Actively listened to pt and offered support and encouragement. Medication administered per order.  Q15 minute safety checks maintained.  R: Pt is safe on the unit.  Pt is compliant with medication.  Pt verbally contracts for safety.  Will continue to monitor and assess.

## 2016-10-30 NOTE — Progress Notes (Signed)
Pt dit take the Trazodone/ vistaril last night and he stated he slept good, pt still endorses a fear of dying in his sleep, constant reassurance given due to 15 min checks

## 2016-10-30 NOTE — Progress Notes (Signed)
D: [name] Newton has been pleasant and appropriate on the unit today. He's denied SI, HI, and AVH. He's rated anxiety, depression, and hopelessness all zero out of ten. He reported good sleep, good appetite, normal energy level, and good concentration. He wrote that his goal is "just getting through the day without argument." He has been visible in the milieu with no behavioral issues or problems noted.   A: Meds given as ordered. Q15 safety checks maintained. Support/encouragement offered.  R: Pt remains free from harm and continues with treatment. Will continue to monitor for needs/safety.

## 2016-10-30 NOTE — Progress Notes (Signed)
   10/30/16 1551 10/30/16 1600  Vital Signs  Pulse Rate 88 79  Pulse Rate Source Dinamap Dinamap  Resp 18 17  BP (!) 146/91 121/79  BP Location Left Arm Left Arm  BP Method Automatic Automatic  Patient Position (if appropriate) Lying Lying  Oxygen Therapy  SpO2 98 % 100 %  O2 Device Room Air Room Air   [name] Newton was playing basketball in the gym when he began experiencing angina. He was escorted back to the unit in a wheelchair and given 0.4 mg nitroglycerin at 1547. He rated his chest pain as a max of 7 and said that the nitro brought it down to a 1. He was satisfied with the relief brought by the nitro. He said he was feeling much better not longer after. He was joking and talking with peers and then went to dinner. Will continue to monitor for needs/safety.

## 2016-10-30 NOTE — Progress Notes (Signed)
[facility] MD Progress Note  10/30/2016 1:35 PM [name] Newton  MRN:  [id number] Subjective:  Im doing good. I had trouble sleeping last night, and was up until 1 oclock when they gave me something.    Per nursing:Pt has been very flat and depressed on the unit today, he reported that his wife was trying to take his son to [place]. Pt reported that he no longer wanted any phone calls from his wife because she was stressing him out. Pt then came back to staff and reported that his wife was bipolar and that he fixed things over the phone and that now he would take her phone calls. Pt reported that his depression was a 2, his hopelessness was a 0, and his anxiety was a 0. Pt reported that his goal for today was to do whatever it took to get home. Pt reported being negative SI/HI, no AH/VH noted.   Objective: Case discussed during treatment team  and chart reviewed. During this evaluation patient remains alert and oriented x3, calm, and cooperative. [name] Newton continues to show improvement in treatment as good response to current medication  po daily for depression management. He reports that he is now socializing and engaging well with people. He endorses having trust issues and was not able to talk to people when he first got here. He is anticipating discharge tomorrow. He reports this medication is well tolerated with adverse effects.  He reports difficulty with sleeping last night. However at the time of this evlauation he is observed drinking coffee at 1pm. He notes that his eating pattern is good and remains unchanged without difficulty. No irritability noted or reported and patient continues to engage well with both peers and staff. He continues to refute any active or passive suicidal thoughts  At current, he is able to contract for safety on the unit.       Principal Problem: MDD (major depressive disorder), recurrent severe, without psychosis (HCC) Diagnosis:   Patient Active Problem List    Diagnosis Date Noted  . MDD (major depressive disorder), recurrent severe, without psychosis (HCC) [F33.2] 10/28/2016  . Effusion of bursa of elbow, left [M25.422] 09/08/2016  . Diabetic autonomic neuropathy associated with type 2 diabetes mellitus (HCC) [E11.43] 07/06/2016  . Dyslipidemia [E78.5] 07/06/2016  . Rash [R21] 02/15/2016  . Stable angina (HCC) [I20.8] 02/15/2016  . Hyperlipidemia [E78.5] 11/23/2015  . Essential hypertension [I10] 07/26/2015  . Erectile dysfunction [N52.9] 07/26/2015  . Healthcare maintenance [Z00.00] 07/15/2014  . GERD (gastroesophageal reflux disease) [K21.9] 07/24/2013  . Tobacco abuse [Z72.0] 12/03/2012  . Fatty infiltration of liver [K76.0] 07/18/2012  . COPD (chronic obstructive pulmonary disease) (HCC) [J44.9] 07/04/2012  . Hypertriglyceridemia [E78.1]   . Coronary artery disease [I25.10]   . NSTEMI (non-ST elevated myocardial infarction) (HCC) [I21.4] 08/29/2010  . Diabetes type 2, uncontrolled with neuropathy [E11.65] 05/30/2010   Total Time spent with patient: 20 minutes  Past Psychiatric History:Patient has not had any inpatient or outpatient treatment  Past Medical History:  Past Medical History:  Diagnosis Date  . Arthritis   . Asthma   . CHF (congestive heart failure) (HCC)   . Complication of anesthesia 09/16/2016   in 1998 patient dislocated his shoulder and was told he should never be "put under" again because he was very difficult to awake from anesthesia. He is very concerned about this  . COPD (chronic obstructive pulmonary disease) (HCC)   . Diabetes mellitus   . Guaiac positive stools  Guaiac positive stool with stable H and H.   . Hyperlipidemia   . Hypertension   . NSTEMI (non-ST elevated myocardial infarction) [facility](HCC) April 2012   status post successful PCI and bare-metal  stenting of the first diagonal this admission.   . Tobacco abuse     Past Surgical History:  Procedure Laterality Date  . CARDIAC CATHETERIZATION      Normal EF. 90% diagonal lesion  . CLOSED REDUCTION SHOULDER DISLOCATION Right 1998  . CORONARY STENT PLACEMENT  April 2012   Diagonal lesion. Bare metal  . OLECRANON BURSECTOMY Left 09/20/2016   Procedure: EXCISION OLECRANON BURSA;  Surgeon: Cammy CopaScott Gregory Dean, MD;  [facility];  Service: Orthopedics;  Laterality: Left;  . UMBILICAL HERNIA REPAIR     Family History:  Family History  Problem Relation Age of Onset  . Heart attack Father   . Coronary artery disease Brother   . Heart attack Brother    Family Psychiatric  History:  Social History:  History  Alcohol Use No     History  Drug Use  . Types: Marijuana    Social History   Social History  . Marital status: Divorced    Spouse name: N/A  . Number of children: N/A  . Years of education: 10   Occupational History  . none     Unemployed since 2012   Social History Main Topics  . Smoking status: Current Some Day Smoker    Packs/day: 2.00    Types: Cigarettes  . Smokeless tobacco: Former NeurosurgeonUser    Types: Chew     Comment: Patches do not work.  Cutting back.  Wants Chantix  . Alcohol use No  . Drug use: Yes    Types: Marijuana  . Sexual activity: Yes    Birth control/ protection: None   Other Topics Concern  . None   Social History Narrative   Patient is intermittently homeless without any income at the moment.          Additional Social History:     Sleep: Good  Appetite:  Good  Current Medications: Current Facility-Administered Medications  Medication Dose Route Frequency Provider Last Rate Last Dose  . acetaminophen (TYLENOL) tablet 650 mg  650 mg Oral Q6H PRN Laveda AbbeParks, Laurie Britton, NP      . albuterol (PROVENTIL HFA;VENTOLIN HFA) 108 (90 Base) MCG/ACT inhaler 2 puff  2 puff Inhalation Q6H PRN Laveda AbbeParks, Laurie Britton, NP      . alum & mag hydroxide-simeth (MAALOX/MYLANTA) 200-200-20 MG/5ML suspension 30 mL  30 mL Oral Q4H PRN Laveda AbbeParks, Laurie Britton, NP      . atorvastatin (LIPITOR) tablet 40 mg   40 mg Oral Daily Laveda AbbeParks, Laurie Britton, NP   40 mg at 10/30/16 [id number]  . buPROPion (WELLBUTRIN XL) 24 hr tablet 150 mg  150 mg Oral Daily Nelly RoutKumar, Archana, MD   150 mg at 10/30/16 [id number]  . hydrOXYzine (ATARAX/VISTARIL) tablet 25 mg  25 mg Oral TID PRN Laveda AbbeParks, Laurie Britton, NP   25 mg at 10/30/16 0103  . insulin aspart (novoLOG) injection 0-5 Units  0-5 Units Subcutaneous QHS Laveda AbbeParks, Laurie Britton, NP      . insulin aspart (novoLOG) injection 0-9 Units  0-9 Units Subcutaneous TID WC Laveda AbbeParks, Laurie Britton, NP   3 Units at 10/30/16 1214  . magnesium hydroxide (MILK OF MAGNESIA) suspension 30 mL  30 mL Oral Daily PRN Laveda AbbeParks, Laurie Britton, NP      . metFORMIN (GLUCOPHAGE) tablet 1,000 mg  1,000 mg Oral BID WC Laveda Abbe, NP   1,000 mg at 10/30/16 1610  . nitroGLYCERIN (NITROSTAT) SL tablet 0.4 mg  0.4 mg Sublingual Q5 min PRN Cobos, Rockey Situ, MD   0.4 mg at 10/28/16 1721  . traZODone (DESYREL) tablet 50 mg  50 mg Oral QHS PRN Laveda Abbe, NP   50 mg at 10/30/16 0103    Lab Results:  Results for orders placed or performed during the hospital encounter of 10/28/16 (from the past 48 hour(s))  Glucose, capillary     Status: Abnormal   Collection Time: 10/28/16  5:16 PM  Result Value Ref Range   Glucose-Capillary 253 (H) 65 - 99 mg/dL  Glucose, capillary     Status: Abnormal   Collection Time: 10/28/16  9:10 PM  Result Value Ref Range   Glucose-Capillary 142 (H) 65 - 99 mg/dL  Glucose, capillary     Status: Abnormal   Collection Time: 10/29/16  5:54 AM  Result Value Ref Range   Glucose-Capillary 166 (H) 65 - 99 mg/dL   Comment 1 Notify RN   Glucose, capillary     Status: Abnormal   Collection Time: 10/29/16 11:53 AM  Result Value Ref Range   Glucose-Capillary 162 (H) 65 - 99 mg/dL  Glucose, capillary     Status: Abnormal   Collection Time: 10/29/16  5:09 PM  Result Value Ref Range   Glucose-Capillary 341 (H) 65 - 99 mg/dL  Glucose, capillary     Status: Abnormal    Collection Time: 10/29/16  8:39 PM  Result Value Ref Range   Glucose-Capillary 168 (H) 65 - 99 mg/dL   Comment 1 Notify RN   Glucose, capillary     Status: Abnormal   Collection Time: 10/30/16  5:52 AM  Result Value Ref Range   Glucose-Capillary 140 (H) 65 - 99 mg/dL   Comment 1 Notify RN   Lipid panel     Status: Abnormal   Collection Time: 10/30/16  6:16 AM  Result Value Ref Range   Cholesterol 225 (H) 0 - 200 mg/dL   Triglycerides 960 (H) <150 mg/dL   HDL 35 (L) >45 mg/dL   Total CHOL/HDL Ratio 6.4 RATIO   VLDL 43 (H) 0 - 40 mg/dL   LDL Cholesterol 409 (H) 0 - 99 mg/dL    Comment:        Total Cholesterol/HDL:CHD Risk Coronary Heart Disease Risk Table                     Men   Women  1/2 Average Risk   3.4   3.3  Average Risk       5.0   4.4  2 X Average Risk   9.6   7.1  3 X Average Risk  23.4   11.0        Use the calculated Patient Ratio above and the CHD Risk Table to determine the patient's CHD Risk.        ATP III CLASSIFICATION (LDL):  <100     mg/dL   Optimal  811-914  mg/dL   Near or Above                    Optimal  130-159  mg/dL   Borderline  782-956  mg/dL   High  >213     mg/dL   Very High Performed at [facility], [address] [place], [place] [address]   TSH  Status: Abnormal   Collection Time: 10/30/16  6:16 AM  Result Value Ref Range   TSH 6.147 (H) 0.350 - 4.500 uIU/mL    Comment: Performed by a 3rd Generation assay with a functional sensitivity of <=0.01 uIU/mL. Performed at [facility], [address] [place], [place] [address]   Glucose, capillary     Status: Abnormal   Collection Time: 10/30/16 11:32 AM  Result Value Ref Range   Glucose-Capillary 222 (H) 65 - 99 mg/dL    Blood Alcohol level:  Lab Results  Component Value Date   ETH <5 10/27/2016    Metabolic Disorder Labs: Lab Results  Component Value Date   HGBA1C 9.0 10/04/2016   MPG 246 09/16/2016   MPG 197 (H) 09/22/2010   No results  found for: PROLACTIN Lab Results  Component Value Date   CHOL 225 (H) 10/30/2016   TRIG 217 (H) 10/30/2016   HDL 35 (L) 10/30/2016   CHOLHDL 6.4 10/30/2016   VLDL 43 (H) 10/30/2016   LDLCALC 147 (H) 10/30/2016   LDLCALC Comment 07/06/2016    Physical Findings: AIMS: Facial and Oral Movements Muscles of Facial Expression: None, normal Lips and Perioral Area: None, normal Jaw: None, normal Tongue: None, normal,Extremity Movements Upper (arms, wrists, hands, fingers): None, normal Lower (legs, knees, ankles, toes): None, normal, Trunk Movements Neck, shoulders, hips: None, normal, Overall Severity Severity of abnormal movements (highest score from questions above): None, normal Incapacitation due to abnormal movements: None, normal Patient's awareness of abnormal movements (rate only patient's report): No Awareness, Dental Status Current problems with teeth and/or dentures?: Yes Does patient usually wear dentures?: No  CIWA:    COWS:     Musculoskeletal: Strength & Muscle Tone: within normal limits Gait & Station: normal Patient leans: N/A  Psychiatric Specialty Exam: Physical Exam   ROS   Blood pressure 98/70, pulse (!) 108, temperature 98.4 F (36.9 C), temperature source Oral, resp. rate 18, height 6\' 4"  (1.93 m), weight 85.3 kg (188 lb), SpO2 100 %.Body mass index is 22.88 kg/m.  General Appearance: Fairly Groomed  Eye Contact:  Good  Speech:  Clear and Coherent and Normal Rate  Volume:  Increased  Mood:  Euthymic  Affect:  Congruent  Thought Process:  Goal Directed, Linear and Descriptions of Associations: Intact  Orientation:  Full (Time, Place, and Person)  Thought Content:  WDL  Suicidal Thoughts:  No  Homicidal Thoughts:  No  Memory:  Immediate;   Good Recent;   Good Remote;   Fair  Judgement:  Intact  Insight:  Fair and Present  Psychomotor Activity:  Normal  Concentration:  Concentration: Fair and Attention Span: Fair  Recall:  [employer] of  Knowledge:  Fair  Language:  Fair  Akathisia:  No  Handed:  Right  AIMS (if indicated):     Assets:  Communication Skills  ADL's:  Intact  Cognition:  WNL  Sleep:  Number of Hours: 4.5     Treatment Plan Summary: Daily contact with patient to assess and evaluate symptoms and progress in treatment and Medication management    To restart patient's metformin thousand milligrams twice daily To start trazodone 50 mg 1 at bedtime as needed for sleep To start Wellbutrin XL 150 mg 1 in the morning to help her depression. The risks and benefits along with the side effects were discussed with patient and he was agreeable with this plan.  To continue Lipitor 40 mg 1 daily for patient's high cholesterol To  continue hydroxyzine 25 mg 3 times daily as needed for anxiety Truman Hayward, FNP 10/30/2016, 1:35 PM

## 2016-10-31 ENCOUNTER — Ambulatory Visit (INDEPENDENT_AMBULATORY_CARE_PROVIDER_SITE_OTHER): Payer: Medicare Other | Admitting: Orthopedic Surgery

## 2016-10-31 LAB — GLUCOSE, CAPILLARY
Glucose-Capillary: 223 mg/dL — ABNORMAL HIGH (ref 65–99)
Glucose-Capillary: 195 mg/dL — ABNORMAL HIGH (ref 65–99)
Glucose-Capillary: 252 mg/dL — ABNORMAL HIGH (ref 65–99)
Glucose-Capillary: 155 mg/dL — ABNORMAL HIGH (ref 65–99)

## 2016-10-31 LAB — PROLACTIN: Prolactin: 15.1 ng/mL (ref 4.0–15.2)

## 2016-10-31 LAB — T4, FREE: Free T4: 0.77 ng/dL (ref 0.61–1.12)

## 2016-10-31 NOTE — Progress Notes (Signed)
Patient attended group and said that his day was a 10.  Cooping skill for today, he socialized, played basketball and read.

## 2016-10-31 NOTE — Progress Notes (Signed)
Adult Psychoeducational Group Note  Date:  10/31/2016 Time:  10:15 PM  Group Topic/Focus:  Wrap-Up Group:   The focus of this group is to help patients review their daily goal of treatment and discuss progress on daily workbooks.  Participation Level:  Active  Participation Quality:  Appropriate  Affect:  Appropriate  Cognitive:  Alert  Insight: Appropriate  Engagement in Group:  Engaged  Modes of Intervention:  Discussion  Additional Comments:  Patient stated having an okay day. Patient's goal for today was to stay out of trouble.  [name] Newton L [name] 10/31/2016, 10:15 PM

## 2016-10-31 NOTE — Tx Team (Signed)
Interdisciplinary Treatment and Diagnostic Plan Update  10/31/2016 Time of Session: 9:00 AM [name] Newton MRN: [id number]  Principal Diagnosis: MDD (major depressive disorder), recurrent severe, without psychosis ([place])  Secondary Diagnoses: Principal Problem:   MDD (major depressive disorder), recurrent severe, without psychosis ([place])   Current Medications:  Current Facility-Administered Medications  Medication Dose Route Frequency Provider Last Rate Last Dose  . acetaminophen (TYLENOL) tablet 650 mg  650 mg Oral Q6H PRN Ethelene Hal, NP      . albuterol (PROVENTIL HFA;VENTOLIN HFA) 108 (90 Base) MCG/ACT inhaler 2 puff  2 puff Inhalation Q6H PRN Ethelene Hal, NP      . alum & mag hydroxide-simeth (MAALOX/MYLANTA) 200-200-20 MG/5ML suspension 30 mL  30 mL Oral Q4H PRN Ethelene Hal, NP      . atorvastatin (LIPITOR) tablet 40 mg  40 mg Oral Daily Ethelene Hal, NP   40 mg at 10/31/16 0851  . buPROPion (WELLBUTRIN XL) 24 hr tablet 150 mg  150 mg Oral Daily Hampton Abbot, MD   150 mg at 10/31/16 0851  . hydrOXYzine (ATARAX/VISTARIL) tablet 25 mg  25 mg Oral TID PRN Ethelene Hal, NP   25 mg at 10/30/16 0103  . insulin aspart (novoLOG) injection 0-5 Units  0-5 Units Subcutaneous QHS Ethelene Hal, NP   2 Units at 10/30/16 2104  . insulin aspart (novoLOG) injection 0-9 Units  0-9 Units Subcutaneous TID WC Ethelene Hal, NP   2 Units at 10/31/16 1204  . magnesium hydroxide (MILK OF MAGNESIA) suspension 30 mL  30 mL Oral Daily PRN Ethelene Hal, NP      . metFORMIN (GLUCOPHAGE) tablet 1,000 mg  1,000 mg Oral BID WC Ethelene Hal, NP   1,000 mg at 10/31/16 0851  . nitroGLYCERIN (NITROSTAT) SL tablet 0.4 mg  0.4 mg Sublingual Q5 min PRN Cobos, Myer Peer, MD   0.4 mg at 10/31/16 0851  . traZODone (DESYREL) tablet 50 mg  50 mg Oral QHS PRN Ethelene Hal, NP   50 mg at 10/30/16 0103   PTA  Medications: Prescriptions Prior to Admission  Medication Sig Dispense Refill Last Dose  . albuterol (PROAIR HFA) 108 (90 Base) MCG/ACT inhaler Inhale 2 puffs into the lungs every 6 (six) hours as needed for wheezing or shortness of breath. (Patient taking differently: Inhale 3-4 puffs into the lungs every 6 (six) hours as needed for wheezing or shortness of breath (3-4 puffs to help breathing). ) 1 Inhaler 2 Past Week at Unknown time  . aspirin 81 MG chewable tablet Chew 243 mg by mouth daily. Takes 3 tablets daily   Past Week at Unknown time  . Fluticasone-Salmeterol (ADVAIR DISKUS) 100-50 MCG/DOSE AEPB Inhale 1 puff into the lungs 2 (two) times daily. 180 each 1 Past Week at Unknown time  . gemfibrozil (LOPID) 600 MG tablet Take 600 mg by mouth 2 (two) times daily before a meal.   Past Week at Unknown time  . glipiZIDE (GLUCOTROL) 5 MG tablet TAKE 1 TABLET BY MOUTH  DAILY BEFORE BREAKFAST 90 tablet 2 Past Week at Unknown time  . Insulin Glargine (LANTUS SOLOSTAR) 100 UNIT/ML Solostar Pen Inject 16 Units into the skin at bedtime. (Patient taking differently: Inject 17 Units into the skin at bedtime. ) 5 pen 11 Past Week at Unknown time  . Insulin Pen Needle (PEN NEEDLES 31GX5/16") 31G X 8 MM MISC Use to give insulin at bedtime. E11.65 30 each 3 Past Week at  Unknown time  . Lancets Misc. (ACCU-CHEK SOFTCLIX LANCET DEV) KIT 100 lancets Use to test blood glucose daily. E11.9 1 kit 3 Past Week at Unknown time  . metFORMIN (GLUCOPHAGE) 1000 MG tablet Take 1 tablet (1,000 mg total) by mouth 2 (two) times daily with a meal. 180 tablet 3 Past Week at Unknown time  . metoprolol tartrate (LOPRESSOR) 25 MG tablet TAKE ONE-HALF TABLET BY  MOUTH TWO TIMES DAILY 60 tablet 3 Past Week at Unknown time  . nitroGLYCERIN (NITROSTAT) 0.4 MG SL tablet DISSOLVE 1 TABLET UNDER THE TONGUE EVERY 5 MINUTES AS  NEEDED FOR CHEST PAIN 100 tablet 0 Past Week at Unknown time  . pregabalin (LYRICA) 50 MG capsule Take 1 capsule (50  mg total) by mouth 3 (three) times daily. (Patient taking differently: Take 50 mg by mouth at bedtime as needed (pain). ) 270 capsule 3 Past Week at Unknown time    Patient Stressors: Financial difficulties Health problems Medication change or noncompliance  Patient Strengths: Ability for insight Average or above average intelligence General fund of knowledge Supportive family/friends  Treatment Modalities: Medication Management, Group therapy, Case management,  1 to 1 session with clinician, Psychoeducation, Recreational therapy.   Physician Treatment Plan for Primary Diagnosis: MDD (major depressive disorder), recurrent severe, without psychosis ([place]) Long Term Goal(s): Improvement in symptoms so as ready for discharge Improvement in symptoms so as ready for discharge   Short Term Goals: Ability to verbalize feelings will improve Ability to disclose and discuss suicidal ideas Ability to demonstrate self-control will improve Ability to identify and develop effective coping behaviors will improve Ability to maintain clinical measurements within normal limits will improve Compliance with prescribed medications will improve Ability to identify triggers associated with substance abuse/mental health issues will improve  Medication Management: Evaluate patient's response, side effects, and tolerance of medication regimen.  Therapeutic Interventions: 1 to 1 sessions, Unit Group sessions and Medication administration.  Evaluation of Outcomes: Progressing  Physician Treatment Plan for Secondary Diagnosis: Principal Problem:   MDD (major depressive disorder), recurrent severe, without psychosis ([place])  Long Term Goal(s): Improvement in symptoms so as ready for discharge Improvement in symptoms so as ready for discharge   Short Term Goals: Ability to verbalize feelings will improve Ability to disclose and discuss suicidal ideas Ability to demonstrate self-control will improve Ability  to identify and develop effective coping behaviors will improve Ability to maintain clinical measurements within normal limits will improve Compliance with prescribed medications will improve Ability to identify triggers associated with substance abuse/mental health issues will improve     Medication Management: Evaluate patient's response, side effects, and tolerance of medication regimen.  Therapeutic Interventions: 1 to 1 sessions, Unit Group sessions and Medication administration.  Evaluation of Outcomes: Progressing   RN Treatment Plan for Primary Diagnosis: MDD (major depressive disorder), recurrent severe, without psychosis ([place]) Long Term Goal(s): Knowledge of disease and therapeutic regimen to maintain health will improve  Short Term Goals: Ability to participate in decision making will improve, Ability to verbalize feelings will improve and Ability to disclose and discuss suicidal ideas  Medication Management: RN will administer medications as ordered by provider, will assess and evaluate patient's response and provide education to patient for prescribed medication. RN will report any adverse and/or side effects to prescribing provider.  Therapeutic Interventions: 1 on 1 counseling sessions, Psychoeducation, Medication administration, Evaluate responses to treatment, Monitor vital signs and CBGs as ordered, Perform/monitor CIWA, COWS, AIMS and Fall Risk screenings as ordered, Perform wound care treatments  as ordered.  Evaluation of Outcomes: Progressing   LCSW Treatment Plan for Primary Diagnosis: MDD (major depressive disorder), recurrent severe, without psychosis ([place]) Long Term Goal(s): Safe transition to appropriate next level of care at discharge, Engage patient in therapeutic group addressing interpersonal concerns.  Short Term Goals: Engage patient in aftercare planning with referrals and resources, Increase ability to appropriately verbalize feelings, Identify triggers  associated with mental health/substance abuse issues and Increase skills for wellness and recovery  Therapeutic Interventions: Assess for all discharge needs, 1 to 1 time with Social worker, Explore available resources and support systems, Assess for adequacy in community support network, Educate family and significant other(s) on suicide prevention, Complete Psychosocial Assessment, Interpersonal group therapy.  Evaluation of Outcomes: Progressing   Progress in Treatment: Attending groups: Yes. Participating in groups: Yes. Taking medication as prescribed: Yes. Toleration medication: Yes. Family/Significant other contact made: No, will contact:  family w patient consent, currently declining collateral contact Patient understands diagnosis: Yes. Discussing patient identified problems/goals with staff: Yes. Medical problems stabilized or resolved: Yes. Denies suicidal/homicidal ideation: Yes.  "States I will never do that again" referring to suicide. "I have too much to live for, I would miss my wife and baby." Issues/concerns per patient self-inventory: No. Other: NA  New problem(s) identified: Yes, Describe:  cannot afford copays required to see specialists w his [employer], states he has many expenses for self and dependents; referred for peer support w [facility] which is free; wants to follow up w PCP for medications  New Short Term/Long Term Goal(s):  Aftercare planning  Discharge Plan or Barriers: [employer] copays are barrier to seeing a psychiatrist per patient, declined all mental health referrals which required copays  Reason for Continuation of Hospitalization: Depression Medication stabilization  Estimated Length of Stay: 3 - 5 days  Attendees: Patient: 10/31/2016 12:16 PM  Physician: Gabriel Earing MD 10/31/2016 12:16 PM  Nursing: Meriel Flavors, RN 10/31/2016 12:16 PM  RN Care Manager: 10/31/2016 12:16 PM  Social Worker: Eusebio Me LCSW 10/31/2016 12:16 PM   Recreational Therapist:  10/31/2016 12:16 PM  Other: Tinnie Gens NP, Aggie NP 10/31/2016 12:16 PM  Other:  10/31/2016 12:16 PM  Other: 10/31/2016 12:16 PM    Scribe for Treatment Team: Beverely Pace, LCSW 10/31/2016 12:16 PM

## 2016-10-31 NOTE — [facility] Group Notes (Signed)
[facility] LCSW Group Therapy  10/31/2016 1:15pm  Type of Therapy:  Group Therapy vercoming Obstacles  Participation Level:  Reserved  Participation Quality:  Attentive  Affect:  Flat  Cognitive:  Appropriate and Oriented  Insight:  Developing/Improving and Improving  Engagement in Therapy:  Improving  Modes of Intervention:  Discussion, Exploration, Problem-solving and Support  Description of Group:   In this group patients will be encouraged to explore what they see as obstacles to their own wellness and recovery. They will be guided to discuss their thoughts, feelings, and behaviors related to these obstacles. The group will process together ways to cope with barriers, with attention given to specific choices patients can make. Each patient will be challenged to identify changes they are motivated to make in order to overcome their obstacles. This group will be process-oriented, with patients participating in exploration of their own experiences as well as giving and receiving support and challenge from other group members.  Summary of Patient Progress: Pt shared that he is regretful of his actions prior to admission but feels that he recognizes that he has more support than previously thought.    Therapeutic Modalities:   Cognitive Behavioral Therapy Solution Focused Therapy Motivational Interviewing Relapse Prevention Therapy   [name] ShanksLauren Jamaury Gumz, LCSW 10/31/2016 4:33 PM

## 2016-10-31 NOTE — Progress Notes (Signed)
Recreation Therapy Notes  Date: 10/31/16 Time: 0930 Location: 300 Hall Group Room  Group Topic: Stress Management  Goal Area(s) Addresses:  Patient will verbalize importance of using healthy stress management.  Patient will identify positive emotions associated with healthy stress management.   Intervention: Stress Management  Activity :  LRT introduced the stress management technique of meditation.  LRT played a meditation on gratitude to allow patients to engage in the meditation.  Patients were to follow along as the meditation played to fully participate in the activity.   Education:  Stress Management, Discharge Planning.   Education Outcome: Acknowledges edcuation/In group clarification offered/Needs additional education  Clinical Observations/Feedback: Pt did not attend group.   Deandrea Rion, LRT/CTRS         Milderd Manocchio A 10/31/2016 11:38 AM 

## 2016-10-31 NOTE — Progress Notes (Signed)
Inpatient Diabetes Program Recommendations  AACE/ADA: New Consensus Statement on Inpatient Glycemic Control (2015)  Target Ranges:  Prepandial:   less than 140 mg/dL      Peak postprandial:   less than 180 mg/dL (1-2 hours)      Critically ill patients:  140 - 180 mg/dL   Lab Results  Component Value Date   GLUCAP 195 (H) 10/31/2016   HGBA1C 9.0 10/04/2016    Review of Glycemic Control  Diabetes history: DM2 Outpatient Diabetes medications: Lantus 17 units QHS, glipizide 5 mg, metformin 1000 mg bid Current orders for Inpatient glycemic control: metformin 1000 mg bid, Novolog 0-9 units tidwc and hs  Blood sugars > goal. Needs portion of basal insulin.  Inpatient Diabetes Program Recommendations:    Add Lantus 8 units QHS Needs to f/u with PCP regarding his diabetes management.  Will continue to follow.  Thank you. Ailene Ardshonda Nealy Karapetian, RD, LDN, CDE Inpatient Diabetes Coordinator [phone]

## 2016-10-31 NOTE — [facility] Group Notes (Signed)
[facility]   10/31/2016 4:25 PM  Participation Quality:  appropriate  Mood/Affect:  Appropriate  Plan for Discharge/Comments:  Return home to wife and child, follow up outpatient w PCP as he cannot afford Medicare copays.  Linked w [employer] peer support, agreeable to this program  Transportation Means: fiancee   Supports fiancee, mother, father  Sallee Langenne C Tashawn Laswell

## 2016-10-31 NOTE — Progress Notes (Signed)
[facility] MD Progress Note  10/31/2016 5:32 PM [name] Newton  MRN:  [id number] Subjective:  Patient states he is wanting to discharge soon. States he would rather be at home with his wife/ loved ones . He denies suicidal ideations. He denies medication side effects.    Objective:  I have discussed case with treatment team and have met with patient . He is a 56 year old male, who presented due to depression and suicide attempt by overdosing on Lyrica. Patient states this was impulsive, unplanned, and at this time denies/ minimizes recent severe depression. States " it was a bad day, I guess". He denies any lingering suicide ideations, and states he regretted overdosing immediately. States he worries about his blood glucose levels being higher in hospital than they normally are. No disruptive or agitated behaviors on unit, going to some groups. Denies medication side effects at this time. Denies lingering symptoms stemming from lyrica overdose   Of note patient had reported temporary chest pain while at the gym yesterday- today denies any chest pain or discomfort, no dyspnea.     Principal Problem: MDD (major depressive disorder), recurrent severe, without psychosis ([place]) Diagnosis:   Patient Active Problem List   Diagnosis Date Noted  . MDD (major depressive disorder), recurrent severe, without psychosis ([place]) [F33.2] 10/28/2016  . Effusion of bursa of elbow, left [M25.422] 09/08/2016  . Diabetic autonomic neuropathy associated with type 2 diabetes mellitus ([place]) [E11.43] 07/06/2016  . Dyslipidemia [E78.5] 07/06/2016  . Rash [R21] 02/15/2016  . Stable angina ([place]) [I20.8] 02/15/2016  . Hyperlipidemia [E78.5] 11/23/2015  . Essential hypertension [I10] 07/26/2015  . Erectile dysfunction [N52.9] 07/26/2015  . Healthcare maintenance [Z00.00] 07/15/2014  . GERD (gastroesophageal reflux disease) [K21.9] 07/24/2013  . Tobacco abuse [Z72.0] 12/03/2012  . Fatty infiltration of liver [K76.0]  07/18/2012  . COPD (chronic obstructive pulmonary disease) ([place]) [J44.9] 07/04/2012  . Hypertriglyceridemia [E78.1]   . Coronary artery disease [I25.10]   . NSTEMI (non-ST elevated myocardial infarction) ([place]) [I21.4] 08/29/2010  . Diabetes type 2, uncontrolled with neuropathy [E11.65] 05/30/2010   Total Time spent with patient: 20 minutes  Past Psychiatric History:Patient has not had any inpatient or outpatient treatment  Past Medical History:  Past Medical History:  Diagnosis Date  . Arthritis   . Asthma   . CHF (congestive heart failure) ([place])   . Complication of anesthesia 09/16/2016   in 1998 patient dislocated his shoulder and was told he should never be "put under" again because he was very difficult to awake from anesthesia. He is very concerned about this  . COPD (chronic obstructive pulmonary disease) ([place])   . Diabetes mellitus   . Guaiac positive stools     Guaiac positive stool with stable H and H.   . Hyperlipidemia   . Hypertension   . NSTEMI (non-ST elevated myocardial infarction) [facility]) April 2012   status post successful PCI and bare-metal  stenting of the first diagonal this admission.   . Tobacco abuse     Past Surgical History:  Procedure Laterality Date  . CARDIAC CATHETERIZATION     Normal EF. 90% diagonal lesion  . CLOSED REDUCTION SHOULDER DISLOCATION Right 1998  . CORONARY STENT PLACEMENT  April 2012   Diagonal lesion. Bare metal  . OLECRANON BURSECTOMY Left 09/20/2016   Procedure: EXCISION OLECRANON BURSA;  Surgeon: Meredith Pel, MD;  Location: [place];  Service: Orthopedics;  Laterality: Left;  . UMBILICAL HERNIA REPAIR     Family History:  Family History  Problem Relation Age of Onset  . Heart attack Father   . Coronary artery disease Brother   . Heart attack Brother    Family Psychiatric  History:  Social History:  History  Alcohol Use No     History  Drug Use  . Types: Marijuana    Social History   Social History  . Marital  status: Divorced    Spouse name: N/A  . Number of children: N/A  . Years of education: 10   Occupational History  . none     Unemployed since 2012   Social History Main Topics  . Smoking status: Current Some Day Smoker    Packs/day: 2.00    Types: Cigarettes  . Smokeless tobacco: Former Systems developer    Types: Chew     Comment: Patches do not work.  Cutting back.  Wants Chantix  . Alcohol use No  . Drug use: Yes    Types: Marijuana  . Sexual activity: Yes    Birth control/ protection: None   Other Topics Concern  . None   Social History Narrative   Patient is intermittently homeless without any income at the moment.          Additional Social History:     Sleep: Good  Appetite:  Good  Current Medications: Current Facility-Administered Medications  Medication Dose Route Frequency Provider Last Rate Last Dose  . acetaminophen (TYLENOL) tablet 650 mg  650 mg Oral Q6H PRN Ethelene Hal, NP      . albuterol (PROVENTIL HFA;VENTOLIN HFA) 108 (90 Base) MCG/ACT inhaler 2 puff  2 puff Inhalation Q6H PRN Ethelene Hal, NP      . alum & mag hydroxide-simeth (MAALOX/MYLANTA) 200-200-20 MG/5ML suspension 30 mL  30 mL Oral Q4H PRN Ethelene Hal, NP      . atorvastatin (LIPITOR) tablet 40 mg  40 mg Oral Daily Ethelene Hal, NP   40 mg at 10/31/16 0851  . buPROPion (WELLBUTRIN XL) 24 hr tablet 150 mg  150 mg Oral Daily Hampton Abbot, MD   150 mg at 10/31/16 0851  . hydrOXYzine (ATARAX/VISTARIL) tablet 25 mg  25 mg Oral TID PRN Ethelene Hal, NP   25 mg at 10/30/16 0103  . insulin aspart (novoLOG) injection 0-5 Units  0-5 Units Subcutaneous QHS Ethelene Hal, NP   2 Units at 10/30/16 2104  . insulin aspart (novoLOG) injection 0-9 Units  0-9 Units Subcutaneous TID WC Ethelene Hal, NP   3 Units at 10/31/16 1712  . magnesium hydroxide (MILK OF MAGNESIA) suspension 30 mL  30 mL Oral Daily PRN Ethelene Hal, NP      . metFORMIN  (GLUCOPHAGE) tablet 1,000 mg  1,000 mg Oral BID WC Ethelene Hal, NP   1,000 mg at 10/31/16 1712  . nitroGLYCERIN (NITROSTAT) SL tablet 0.4 mg  0.4 mg Sublingual Q5 min PRN Eastyn Dattilo, Myer Peer, MD   0.4 mg at 10/31/16 0851  . traZODone (DESYREL) tablet 50 mg  50 mg Oral QHS PRN Ethelene Hal, NP   50 mg at 10/30/16 0103    Lab Results:  Results for orders placed or performed during the hospital encounter of 10/28/16 (from the past 48 hour(s))  Glucose, capillary     Status: Abnormal   Collection Time: 10/29/16  8:39 PM  Result Value Ref Range   Glucose-Capillary 168 (H) 65 - 99 mg/dL   Comment 1 Notify RN   Glucose, capillary  Status: Abnormal   Collection Time: 10/30/16  5:52 AM  Result Value Ref Range   Glucose-Capillary 140 (H) 65 - 99 mg/dL   Comment 1 Notify RN   Lipid panel     Status: Abnormal   Collection Time: 10/30/16  6:16 AM  Result Value Ref Range   Cholesterol 225 (H) 0 - 200 mg/dL   Triglycerides 217 (H) <150 mg/dL   HDL 35 (L) >40 mg/dL   Total CHOL/HDL Ratio 6.4 RATIO   VLDL 43 (H) 0 - 40 mg/dL   LDL Cholesterol 147 (H) 0 - 99 mg/dL    Comment:        Total Cholesterol/HDL:CHD Risk Coronary Heart Disease Risk Table                     Men   Women  1/2 Average Risk   3.4   3.3  Average Risk       5.0   4.4  2 X Average Risk   9.6   7.1  3 X Average Risk  23.4   11.0        Use the calculated Patient Ratio above and the CHD Risk Table to determine the patient's CHD Risk.        ATP III CLASSIFICATION (LDL):  <100     mg/dL   Optimal  100-129  mg/dL   Near or Above                    Optimal  130-159  mg/dL   Borderline  160-189  mg/dL   High  >190     mg/dL   Very High Performed at [facility] [address] [place], [place] [address]   TSH     Status: Abnormal   Collection Time: 10/30/16  6:16 AM  Result Value Ref Range   TSH 6.147 (H) 0.350 - 4.500 uIU/mL    Comment: Performed by a 3rd Generation assay with a functional  sensitivity of <=0.01 uIU/mL. Performed at [facility], [place] [address] [place], [place] [address]   Prolactin     Status: None   Collection Time: 10/30/16  6:16 AM  Result Value Ref Range   Prolactin 15.1 4.0 - 15.2 ng/mL    Comment: (NOTE) Performed At: [facility] [place], [place] [id number] Lindon Romp MD IO:[id number] Performed at [facility], [place] [address] [place], [place] [address]   Glucose, capillary     Status: Abnormal   Collection Time: 10/30/16 11:32 AM  Result Value Ref Range   Glucose-Capillary 222 (H) 65 - 99 mg/dL  Glucose, capillary     Status: Abnormal   Collection Time: 10/30/16  4:47 PM  Result Value Ref Range   Glucose-Capillary 242 (H) 65 - 99 mg/dL  Glucose, capillary     Status: Abnormal   Collection Time: 10/30/16  8:36 PM  Result Value Ref Range   Glucose-Capillary 234 (H) 65 - 99 mg/dL  Glucose, capillary     Status: Abnormal   Collection Time: 10/31/16  5:52 AM  Result Value Ref Range   Glucose-Capillary 223 (H) 65 - 99 mg/dL  Glucose, capillary     Status: Abnormal   Collection Time: 10/31/16 11:50 AM  Result Value Ref Range   Glucose-Capillary 195 (H) 65 - 99 mg/dL   Comment 1 Notify RN    Comment 2 Document in Chart   Glucose, capillary  Status: Abnormal   Collection Time: 10/31/16  4:56 PM  Result Value Ref Range   Glucose-Capillary 252 (H) 65 - 99 mg/dL   Comment 1 Notify RN    Comment 2 Document in Chart     Blood Alcohol level:  Lab Results  Component Value Date   ETH <5 16/02/9603    Metabolic Disorder Labs: Lab Results  Component Value Date   HGBA1C 9.0 10/04/2016   MPG 246 09/16/2016   MPG 197 (H) 09/22/2010   Lab Results  Component Value Date   PROLACTIN 15.1 10/30/2016   Lab Results  Component Value Date   CHOL 225 (H) 10/30/2016   TRIG 217 (H) 10/30/2016   HDL 35 (L) 10/30/2016   CHOLHDL 6.4 10/30/2016   VLDL 43 (H) 10/30/2016    LDLCALC 147 (H) 10/30/2016   LDLCALC Comment 07/06/2016    Physical Findings: AIMS: Facial and Oral Movements Muscles of Facial Expression: None, normal Lips and Perioral Area: None, normal Jaw: None, normal Tongue: None, normal,Extremity Movements Upper (arms, wrists, hands, fingers): None, normal Lower (legs, knees, ankles, toes): None, normal, Trunk Movements Neck, shoulders, hips: None, normal, Overall Severity Severity of abnormal movements (highest score from questions above): None, normal Incapacitation due to abnormal movements: None, normal Patient's awareness of abnormal movements (rate only patient's report): No Awareness, Dental Status Current problems with teeth and/or dentures?: Yes Does patient usually wear dentures?: No  CIWA:    COWS:     Musculoskeletal: Strength & Muscle Tone: within normal limits Gait & Station: normal Patient leans: N/A  Psychiatric Specialty Exam: Physical Exam  ROS denies headache, no current chest pain, no shortness of breath  Blood pressure 100/81, pulse 98, temperature 98.5 F (36.9 C), temperature source Oral, resp. rate 20, height 6' 4"  (1.93 m), weight 85.3 kg (188 lb), SpO2 100 %.Body mass index is 22.88 kg/m.  General Appearance: Fairly Groomed  Eye Contact:  Good  Speech:  Normal Rate  Volume:  Decreased  Mood:  reports he is feeling better, currently denies feeling depressed   Affect:  mildly constricted   Thought Process:  Goal Directed and Descriptions of Associations: Intact  Orientation:  Full (Time, Place, and Person)  Thought Content:  denies hallucinations, no delusions, not internally preoccupied   Suicidal Thoughts:  No denies suicidal ideations at this time, and denies any self injurious ideations . Denies homicidal or violent ideations   Homicidal Thoughts:  No  Memory:  Recent and remote grossly intact   Judgement:  Fair  Insight:  Fair  Psychomotor Activity:  Normal  Concentration:  Concentration: Good and  Attention Span: Good  Recall:  Good  Fund of Knowledge:  Good  Language:  Good  Akathisia:  Negative  Handed:  Right  AIMS (if indicated):     Assets:  Communication Skills  ADL's:  Intact  Cognition:  WNL  Sleep:  Number of Hours: 6.5   Assessment - patient reports he is feeling better and denies suicidal ideations . At this time his focus is to be discharged soon in order to return home. Denies medication side effects. We discussed Wellbutrin trial, including potential cardiovascular side effects associated with Buproprion. Of note, patient is a heavy smokes ( > 1 PPD ) and as is tolerating Buproprion well at this time, and this medication may help him with smoking cessation, we decided to continue it at this time. Side effects as above reviewed .  Of note, TSH is mildly elevated.   Treatment  Plan Summary: Daily contact with patient to assess and evaluate symptoms and progress in treatment and Medication management  Continue to encourage group and milieu participation to work on coping skills and symptom reduction. Continue Wellbutrin XL 150 mgrs QAM for depression and to help with smoking cessation Continue Diabetic management Continue Trazodone 50 mgrs QHS PRN for insomnia  Continue Nitroglycerin PRNs for chest pain as needed  Check FT4, FT3, due to elevated TSH  Jenne Campus, MD 10/31/2016, 5:32 PM   Patient ID: [name] Newton, male   DOB: 1960-10-06, 56 y.o.   MRN: [id number]

## 2016-10-31 NOTE — Progress Notes (Signed)
D: Pt was in the hallway upon initial approach.  Pt presents with anxious affect and mood.  His goal is to "socialize with others and read."  Pt reports having a "great" visit with his wife tonight.  Pt denies SI/HI, denies hallucinations, denies pain.  Pt has been visible in milieu interacting with peers and staff appropriately.  Pt attended evening group.    A: Introduced self to pt.  Actively listened to pt and offered support and encouragement.  PRN medication administered for sleep and anxiety per request.  Q15 minute safety checks maintained.  R: Pt is safe on the unit.  Pt is compliant with medications.  Pt verbally contracts for safety.  Will continue to monitor and assess.

## 2016-10-31 NOTE — Progress Notes (Signed)
DAR NOTE: Patient presents with anxious affect and depressed mood.  Complained of chest pain, nitro one does given with good relief. Pt denies auditory and visual hallucinations.  Rates depression at 0, hopelessness at 0, and anxiety at 0.  Maintained on routine safety checks.  Medications given as prescribed.  Support and encouragement offered as needed.  Attended group and participated.  States goal for today is "Artistsocial skill   Patient observed socializing with peers in the dayroom.  Offered no complaint.

## 2016-11-01 LAB — GLUCOSE, CAPILLARY
Glucose-Capillary: 209 mg/dL — ABNORMAL HIGH (ref 65–99)
Glucose-Capillary: 187 mg/dL — ABNORMAL HIGH (ref 65–99)

## 2016-11-01 MED ORDER — TRAZODONE HCL 50 MG PO TABS: 50.0000 mg | ORAL_TABLET | Freq: Every evening | ORAL | 0 refills | Status: DC | PRN

## 2016-11-01 MED ORDER — BUPROPION HCL ER (XL) 150 MG PO TB24: 150.0000 mg | ORAL_TABLET | Freq: Every day | ORAL | 0 refills | Status: DC

## 2016-11-01 MED ORDER — ACCU-CHEK SOFTCLIX LANCET DEV KIT: PACK | 0 refills | Status: DC

## 2016-11-01 MED ORDER — HYDROXYZINE HCL 25 MG PO TABS: 25.0000 mg | ORAL_TABLET | Freq: Three times a day (TID) | ORAL | 0 refills | Status: DC | PRN

## 2016-11-01 MED ORDER — "PEN NEEDLES 5/16"" 31G X 8 MM MISC": 0 refills | Status: DC

## 2016-11-01 MED ORDER — NITROGLYCERIN 0.4 MG SL SUBL: SUBLINGUAL_TABLET | SUBLINGUAL | 0 refills | Status: AC

## 2016-11-01 MED ORDER — ALBUTEROL SULFATE HFA 108 (90 BASE) MCG/ACT IN AERS: 2.0000 | INHALATION_SPRAY | Freq: Four times a day (QID) | RESPIRATORY_TRACT | 2 refills | Status: AC | PRN

## 2016-11-01 MED ORDER — ATORVASTATIN CALCIUM 40 MG PO TABS: 40.0000 mg | ORAL_TABLET | Freq: Every day | ORAL | Status: DC

## 2016-11-01 MED ORDER — METFORMIN HCL 1000 MG PO TABS: 1000.0000 mg | ORAL_TABLET | Freq: Two times a day (BID) | ORAL | 0 refills | Status: DC

## 2016-11-01 NOTE — Progress Notes (Signed)
D:  Patient's self inventory sheet, patient sleeps good, sleep medication helpful.  Good appetite, normal energy level, good concentration.  Denied depression, hopeless and anxiety.  Denied withdrawals.  Denied SI.  Denied physical problems.  Denied physical pain.  Goal is discharge.  Plans to stay out of trouble.  Does have discharge plans. A:  Medications administered per MD orders.  Emotional support and encouragement given patient. R:  Patient denied SI and HI, contracts for safety.   Denied A/V hallucinations.  Safety maintained with 15 minute checks. Looking forward to discharge.

## 2016-11-01 NOTE — BHH Suicide Risk Assessment (Addendum)
[facility] Discharge Suicide Risk Assessment   Principal Problem: MDD (major depressive disorder), recurrent severe, without psychosis (HCC) Discharge Diagnoses:  Patient Active Problem List   Diagnosis Date Noted  . MDD (major depressive disorder), recurrent severe, without psychosis (HCC) [F33.2] 10/28/2016  . Effusion of bursa of elbow, left [M25.422] 09/08/2016  . Diabetic autonomic neuropathy associated with type 2 diabetes mellitus (HCC) [E11.43] 07/06/2016  . Dyslipidemia [E78.5] 07/06/2016  . Rash [R21] 02/15/2016  . Stable angina (HCC) [I20.8] 02/15/2016  . Hyperlipidemia [E78.5] 11/23/2015  . Essential hypertension [I10] 07/26/2015  . Erectile dysfunction [N52.9] 07/26/2015  . Healthcare maintenance [Z00.00] 07/15/2014  . GERD (gastroesophageal reflux disease) [K21.9] 07/24/2013  . Tobacco abuse [Z72.0] 12/03/2012  . Fatty infiltration of liver [K76.0] 07/18/2012  . COPD (chronic obstructive pulmonary disease) (HCC) [J44.9] 07/04/2012  . Hypertriglyceridemia [E78.1]   . Coronary artery disease [I25.10]   . NSTEMI (non-ST elevated myocardial infarction) (HCC) [I21.4] 08/29/2010  . Diabetes type 2, uncontrolled with neuropathy [E11.65] 05/30/2010    Total Time spent with patient: 30 minutes  Musculoskeletal: Strength & Muscle Tone: within normal limits Gait & Station: normal Patient leans: N/A  Psychiatric Specialty Exam: ROS denies headache, no chest pain, no shortness of breath, no diarrhea   Blood pressure 108/70, pulse (!) 112, temperature 98.6 F (37 C), temperature source Oral, resp. rate 18, height 6\' 4"  (1.93 m), weight 85.3 kg (188 lb), SpO2 100 %.Body mass index is 22.88 kg/m.  General Appearance: Well Groomed  Eye Contact::  Good  Speech:  Normal Rate409  Volume:  Normal  Mood:  denies depression, states mood is " back to normal"  Affect:  Appropriate and reactive   Thought Process:  Linear and Descriptions of Associations: Intact  Orientation:  Full (Time,  Place, and Person)  Thought Content:  no hallucinations, no delusions, not internally preoccupied   Suicidal Thoughts:  No- denies any suicidal or self injurious ideations, no homicidal or violent ideations   Homicidal Thoughts:  No  Memory:  recent and remote grossly intact   Judgement:  Other:  improving   Insight:  improving   Psychomotor Activity:  Normal  Concentration:  Good  Recall:  Good  Fund of Knowledge:Good  Language: Good  Akathisia:  Negative  Handed:  Right  AIMS (if indicated):     Assets:  Communication Skills Desire for Improvement Resilience Social Support  Sleep:  Number of Hours: 5.5  Cognition: WNL  ADL's:  Intact   Mental Status Per Nursing Assessment::   On Admission:  Suicidal ideation indicated by patient  Demographic Factors:  56 year old male, married, has a young child, 61 months old. Lives with wife and child.   Loss Factors: Medical issues, some family stressors   Historical Factors: Denies prior psychiatric admissions, no prior suicide attempts   Risk Reduction Factors:   Responsible for children under 94 years of age, Sense of responsibility to family, Living with another person, especially a relative and Positive coping skills or problem solving skills  Continued Clinical Symptoms:  At this time patient is alert , attentive, well related, pleasant ,states mood is "OK", denies feeling depressed, affect is appropriate and reactive, no thought disorder, no suicidal or self injurious ideations, no homicidal or violent ideations, no psychotic symptoms. Future oriented, looking forward to seeing his wife and son. With his express consent , I spoke with his wife via phone. She corroborates that she feels he is improved and at baseline and states she is in  agreement with his being discharged today. Denies medication side effects- on Wellbutrin.  Behavior on unit calm and in good control.  Cognitive Features That Contribute To Risk:  No gross  cognitive deficits noted upon discharge. Is alert , attentive, and oriented x 3   Suicide Risk:  Mild:  Suicidal ideation of limited frequency, intensity, duration, and specificity.  There are no identifiable plans, no associated intent, mild dysphoria and related symptoms, good self-control (both objective and subjective assessment), few other risk factors, and identifiable protective factors, including available and accessible social support.  Follow-up Information    [facility]. Go on 11/02/2016.   Why:  Appointment for follow-up with your primary care physician, Dr. Collie SiadZoe Stallings, on 11/02/16 at 2:20 PM.  Please call to cancel/reschedule if needed.  Patient wants to continue w PCP for medication management due to Medicare copays.  PLEASE GO TO [facility] Contact information: [address] [place] [phone]       [facility] Follow up on 11/04/2016.   Why:  Appointment ON Friday June 8th at 1:30 PM for orientation to [facility] and referral for Peer Support Specialist.  Please call to cancel/reschedule if needed.  Contact information: [address] [place]  [address] Phone:  [phone] Fax:  DO NOT FAX; please mail          Plan Of Care/Follow-up recommendations:  Activity:  as tolerated  Diet:  diabetic and heart healthy diet  Tests:  NA Other:  See below  Patient is requesting discharge and there are no grounds for involuntary commitment at this time. He is leaving unit in good spirits . Patient plans to follow up as above . Has a PCP , Dr. Creta LevinStallings ,whom he follows up for medical issues as needed . We reviewed medication side effects ( on Wellbutrin) , and encouraged efforts to stop smoking .  We reviewed slightly elevated TSH finding- patient to follow up with PCP for follow up.   Craige CottaFernando A Sriyan Cutting, MD 11/01/2016, 10:36 AM

## 2016-11-01 NOTE — BHH Group Notes (Signed)
The focus of this group is to educate the patient on the purpose and policies of crisis stabilization and provide a format to answer questions about their admission.  The group details unit policies and expectations of patients while admitted.  Patient dd not attend 0900 nurse education orientation group this morning.  Patient stayed in bed.      

## 2016-11-01 NOTE — Discharge Summary (Signed)
Physician Discharge Summary Note  Patient:  [name] Newton is an 56 y.o., male MRN:  [id number] DOB:  Dec 26, 1960 Patient phone:  [phone] (home)  Patient address:   [address]  [place] [address],  Total Time spent with patient: Greater than 30 minutes  Date of Admission:  10/28/2016 Date of Discharge: 11-01-16  Reason for Admission: Worsening depression triggering suicide attempt by overdose.   Principal Problem: MDD (major depressive disorder), recurrent severe, without psychosis [facility])  Discharge Diagnoses: Patient Active Problem List   Diagnosis Date Noted  . MDD (major depressive disorder), recurrent severe, without psychosis ([place]) [F33.2] 10/28/2016  . Effusion of bursa of elbow, left [M25.422] 09/08/2016  . Diabetic autonomic neuropathy associated with type 2 diabetes mellitus ([place]) [E11.43] 07/06/2016  . Dyslipidemia [E78.5] 07/06/2016  . Rash [R21] 02/15/2016  . Stable angina ([place]) [I20.8] 02/15/2016  . Hyperlipidemia [E78.5] 11/23/2015  . Essential hypertension [I10] 07/26/2015  . Erectile dysfunction [N52.9] 07/26/2015  . Healthcare maintenance [Z00.00] 07/15/2014  . GERD (gastroesophageal reflux disease) [K21.9] 07/24/2013  . Tobacco abuse [Z72.0] 12/03/2012  . Fatty infiltration of liver [K76.0] 07/18/2012  . COPD (chronic obstructive pulmonary disease) ([place]) [J44.9] 07/04/2012  . Hypertriglyceridemia [E78.1]   . Coronary artery disease [I25.10]   . NSTEMI (non-ST elevated myocardial infarction) ([place]) [I21.4] 08/29/2010  . Diabetes type 2, uncontrolled with neuropathy [E11.65] 05/30/2010   Past Psychiatric History: MDD  Past Medical History:  Past Medical History:  Diagnosis Date  . Arthritis   . Asthma   . CHF (congestive heart failure) ([place])   . Complication of anesthesia 09/16/2016   in 1998 patient dislocated his shoulder and was told he should never be "put under" again because he was very difficult to awake from anesthesia. He  is very concerned about this  . COPD (chronic obstructive pulmonary disease) ([place])   . Diabetes mellitus   . Guaiac positive stools     Guaiac positive stool with stable H and H.   . Hyperlipidemia   . Hypertension   . NSTEMI (non-ST elevated myocardial infarction) [facility]) April 2012   status post successful PCI and bare-metal  stenting of the first diagonal this admission.   . Tobacco abuse     Past Surgical History:  Procedure Laterality Date  . CARDIAC CATHETERIZATION     Normal EF. 90% diagonal lesion  . CLOSED REDUCTION SHOULDER DISLOCATION Right 1998  . CORONARY STENT PLACEMENT  April 2012   Diagonal lesion. Bare metal  . OLECRANON BURSECTOMY Left 09/20/2016   Procedure: EXCISION OLECRANON BURSA;  Surgeon: Meredith Pel, MD;  Location: [facility];  Service: Orthopedics;  Laterality: Left;  . UMBILICAL HERNIA REPAIR     Family History:  Family History  Problem Relation Age of Onset  . Heart attack Father   . Coronary artery disease Brother   . Heart attack Brother    Family Psychiatric  History: See H&P  Social History:  History  Alcohol Use No     History  Drug Use  . Types: Marijuana    Social History   Social History  . Marital status: Divorced    Spouse name: N/A  . Number of children: N/A  . Years of education: 10   Occupational History  . none     Unemployed since 2012   Social History Main Topics  . Smoking status: Current Some Day Smoker    Packs/day: 2.00    Types: Cigarettes  . Smokeless tobacco: Former Systems developer  Types: Chew     Comment: Patches do not work.  Cutting back.  Wants Chantix  . Alcohol use No  . Drug use: Yes    Types: Marijuana  . Sexual activity: Yes    Birth control/ protection: None   Other Topics Concern  . None   Social History Narrative   Patient is intermittently homeless without any income at the moment.          Hospital Course: Patient is a 56 year old male transferred from [facility] ED for stabilization and  treatment of worsening of depression along with suicide attempt. Patient ingested a whole bottle of Lyrica in order to kill himself. Patient states that he does not know why he did it, regrets it and does not feel he needs to be in the hospital. Patient reports that he's been struggling with depression for for 5 months now, adds that his depression has worsened over the last few weeks. On being questioned about stressors, patient reports that he is a 34-monthold baby, adds that his wife is 278years of age and states [place] but he does not have any. Patient states that he does not take his diabetic medications as prescribed, has multiple medical problems, does not have social support as his family does not talk to him. He feels that because of all the stressors he made a poor choice but adds that he wants to feel better, start medications and return back to his family as his wife does not drive. He also reports financial stressors and adds that he is on disability but states that it's hard to pay the bills. Patient denies any history of previous psychiatric admission, reports he uses marijuana and smokes cigarettes but denies any other substance use.  Upon his arrival & admision to the adult unit, CZacheriahwas evaluated & his presenting symptoms identified. The medication management for the presenting symptoms were discussed & initiated targeting those symptoms. He received & was discharged on; Wellbutrin XL 150 mg for depression, Hydroxyzine 25 mg prn for anxiety & Trazodone 50 mg for insomnia. He was enrolled in the group counseling sessions & encouraged to participate in the unit programming. His other pre-existing medical problems were identified & his home medications restarted accordingly. During the course of his hospitalization, CLavalwas evaluated on daily basis by the clinical providers to assure his response to the treatment  regimen.As his treatment progressed,  improvement was noted as evidenced by his report of decreasing symptoms, improved sleep, mood, affect, medication tolerance & active participation in the unit programming. He was encouraged to update his providers on his progress by daily completion of a self inventory assessment, noting mood, mental status, pain, any new symptoms, anxiety and or concerns.  Gabryel's symptoms responded well to his treatment regimen combined with a therapeutic and supportive environment. He was motivated for recovery as evidenced by a positive/appropriate behavior and his interaction with the staff & fellow patients.He also worked closely with the treatment team and case manager to develop a discharge plan with appropriate goals to maintain mood stability after discharge.   Upon discharge, CSourishwas in much improved condition than upon admission.His symptoms were reported as significantly decreased or resolved completely. He adamantly denies any SI/HI,  AVH, delusional thoughts & or paranoia. He was motivated to continue taking medication with a goal of continued improvement in mental health. He will continue psychiatric care on an  outpatient basis as noted below. He was provided with all the necessary information required to make this appointment without problems.  He left [facility] with all personal belongings in no apparent distress. Transportation per his arrangement.   Physical Findings: AIMS: Facial and Oral Movements Muscles of Facial Expression: None, normal Lips and Perioral Area: None, normal Jaw: None, normal Tongue: None, normal,Extremity Movements Upper (arms, wrists, hands, fingers): None, normal Lower (legs, knees, ankles, toes): None, normal, Trunk Movements Neck, shoulders, hips: None, normal, Overall Severity Severity of abnormal movements (highest score from questions above): None, normal Incapacitation due to abnormal movements: None,  normal Patient's awareness of abnormal movements (rate only patient's report): No Awareness, Dental Status Current problems with teeth and/or dentures?: Yes Does patient usually wear dentures?: No  CIWA:  CIWA-Ar Total: 1 COWS:  COWS Total Score: 2  Musculoskeletal: Strength & Muscle Tone: within normal limits Gait & Station: normal Patient leans: N/A  Psychiatric Specialty Exam: Physical Exam  Constitutional: He is oriented to person, place, and time. He appears well-developed.  HENT:  Head: Normocephalic.  Eyes: Pupils are equal, round, and reactive to light.  Neck: Normal range of motion.  Cardiovascular: Normal rate.   Respiratory: Effort normal.  GI: Soft.  Genitourinary:  Genitourinary Comments: Deferred  Musculoskeletal: Normal range of motion.  Neurological: He is alert and oriented to person, place, and time.  Skin: Skin is warm and dry.    Review of Systems  Constitutional: Negative.   HENT: Negative.   Eyes: Negative.   Respiratory: Negative.   Cardiovascular: Negative.   Gastrointestinal: Negative.   Genitourinary: Negative.   Musculoskeletal: Negative.   Skin: Negative.   Neurological: Negative.   Endo/Heme/Allergies: Negative.   Psychiatric/Behavioral: Positive for depression (Stable) and substance abuse (Hx. THC). Negative for memory loss and suicidal ideas. The patient has insomnia (Stable). The patient is not nervous/anxious.     Blood pressure 128/74, pulse 88, temperature 98.6 F (37 C), temperature source Oral, resp. rate 18, height 6' 4"  (1.93 m), weight 85.3 kg (188 lb), SpO2 100 %.Body mass index is 22.88 kg/m.  See Md's SRA   Have you used any form of tobacco in the last 30 days? (Cigarettes, Smokeless Tobacco, Cigars, and/or Pipes): Yes  Has this patient used any form of tobacco in the last 30 days? (Cigarettes, Smokeless Tobacco, Cigars, and/or Pipes): No  Blood Alcohol level:  Lab Results  Component Value Date   ETH <5 28/76/8115    Metabolic Disorder Labs:  Lab Results  Component Value Date   HGBA1C 9.0 10/04/2016   MPG 246 09/16/2016   MPG 197 (H) 09/22/2010   Lab Results  Component Value Date   PROLACTIN 15.1 10/30/2016   Lab Results  Component Value Date   CHOL 225 (H) 10/30/2016   TRIG 217 (H) 10/30/2016   HDL 35 (L) 10/30/2016   CHOLHDL 6.4 10/30/2016   VLDL 43 (H) 10/30/2016   LDLCALC 147 (H) 10/30/2016   [place] Comment 07/06/2016   See Psychiatric Specialty Exam and Suicide Risk Assessment completed by Attending Physician prior to discharge.  Discharge destination:  Home  Is patient on multiple antipsychotic therapies at discharge:  No   Has Patient had three or more failed trials of antipsychotic monotherapy by history:  No  Recommended Plan for Multiple Antipsychotic Therapies: NA  Allergies as of 11/01/2016      Reactions   Codeine Nausea And Vomiting      Medication List    STOP taking these medications  aspirin 81 MG chewable tablet   Fluticasone-Salmeterol 100-50 MCG/DOSE Aepb Commonly known as:  ADVAIR DISKUS   gemfibrozil 600 MG tablet Commonly known as:  LOPID   glipiZIDE 5 MG tablet Commonly known as:  GLUCOTROL   Insulin Glargine 100 UNIT/ML Solostar Pen Commonly known as:  LANTUS SOLOSTAR   metoprolol tartrate 25 MG tablet Commonly known as:  LOPRESSOR   pregabalin 50 MG capsule Commonly known as:  LYRICA     TAKE these medications     Indication  ACCU-CHEK SOFTCLIX LANCET DEV Kit 100 lancets Use to test blood glucose daily. E11.9: For blood sugar monitoring What changed:  additional instructions  Indication:  Glucose monitoring   albuterol 108 (90 Base) MCG/ACT inhaler Commonly known as:  PROAIR HFA Inhale 2 puffs into the lungs every 6 (six) hours as needed for wheezing or shortness of breath. What changed:  how much to take  reasons to take this  Indication:  Asthma   atorvastatin 40 MG tablet Commonly known as:  LIPITOR Take 1 tablet (40  mg total) by mouth daily. For high cholesterol Start taking on:  11/02/2016 What changed:  additional instructions  Indication:  Inherited Homozygous Hypercholesterolemia, High Amount of Triglycerides in the Blood   buPROPion 150 MG 24 hr tablet Commonly known as:  WELLBUTRIN XL Take 1 tablet (150 mg total) by mouth daily. For depression Start taking on:  11/02/2016  Indication:  Major Depressive Disorder   hydrOXYzine 25 MG tablet Commonly known as:  ATARAX/VISTARIL Take 1 tablet (25 mg total) by mouth 3 (three) times daily as needed for anxiety.  Indication:  Anxiety Neurosis   metFORMIN 1000 MG tablet Commonly known as:  GLUCOPHAGE Take 1 tablet (1,000 mg total) by mouth 2 (two) times daily with a meal. For diabetes What changed:  additional instructions  Indication:  Type 2 Diabetes   nitroGLYCERIN 0.4 MG SL tablet Commonly known as:  NITROSTAT DISSOLVE 1 TABLET UNDER THE TONGUE EVERY 5 MINUTES AS  NEEDED: FOR CHEST PAIN What changed:  See the new instructions.  Indication:  Acute Angina Pectoris   PEN NEEDLES 31GX5/16" 31G X 8 MM Misc Use to give insulin at bedtime. E11.65: For blood sugar monitoring. What changed:  additional instructions  Indication:  Glucose monitoring   traZODone 50 MG tablet Commonly known as:  DESYREL Take 1 tablet (50 mg total) by mouth at bedtime as needed for sleep.  Indication:  Trouble Sleeping      Follow-up Information    [facility]. Go on 11/02/2016.   Why:  Appointment for follow-up with your primary care physician, Dr. Delia Chimes, on 11/02/16 at 2:20 PM.  Please call to cancel/reschedule if needed.  Patient wants to continue w PCP for medication management due to Medicare copays.  PLEASE GO TO [facility] Contact information: [place] [address] [facility] Follow up on 11/04/2016.   Why:  Appointment ON Friday June 8th at 1:30 PM for orientation to [facility] and referral for Peer Support Specialist.  Please call to cancel/reschedule if needed.  Contact information: [place]  [address] Phone:  [phone] Fax:  DO NOT FAX; please mail         Follow-up recommendations: Activity:  As tolerated Diet: As recommended by your primary care doctor. Keep all scheduled follow-up appointments as recommended.   Comments: Patient is instructed prior to discharge to: Take  all medications as prescribed by his/her mental healthcare provider. Report any adverse effects and or reactions from the medicines to his/her outpatient provider promptly. Patient has been instructed & cautioned: To not engage in alcohol and or illegal drug use while on prescription medicines. In the event of worsening symptoms, patient is instructed to call the crisis hotline, 911 and or go to the nearest ED for appropriate evaluation and treatment of symptoms. To follow-up with his/her primary care provider for your other medical issues, concerns and or health care needs.   Signed: Encarnacion Slates, NP, PMHNP, FNP-BC 11/01/2016, 1:21 PM   Patient seen, Suicide Assessment Completed.  Disposition Plan Reviewed

## 2016-11-01 NOTE — Progress Notes (Signed)
Discharge Note:  Patient discharged home with family member.  Patient denied SI and HI.  Denied A/V hallucinations.  Denied pain.  Suicide prevention information given to patient who stated he understood and had no questions.  Patient stated he received all his belongings, clothing, misc items, toiletries, prescriptions, etc.  Patient stated he appreciated all [facility].  All required discharge information given to patient at discharge.

## 2016-11-01 NOTE — Progress Notes (Signed)
  [facility]charge [facility] :  Will you be returning to the same living situation after discharge:  Yes,  pt returning home. At discharge, do you have transportation home?: Yes,  pt has access to transportation. Do you have the ability to pay for your medications: Yes,  prescriptions and samples provided.  Release of information consent forms completed and in the chart;  Patient's signature needed at discharge.  Patient to Follow up at: Follow-up Information    [facility]. Go on 11/02/2016.   Why:  Appointment for follow-up with your primary care physician, Dr. Collie SiadZoe Stallings, on 11/02/16 at 2:20 PM.  Please call to cancel/reschedule if needed.  Patient wants to continue w PCP for medication management due to Medicare copays.  PLEASE GO TO [facility] Contact information: [address] [place] [phone]       [facility] Follow up on 11/04/2016.   Why:  Appointment ON Friday June 8th at 1:30 PM for orientation to [facility] and referral for Peer Support Specialist.  Please call to cancel/reschedule if needed.  Contact information: [address] [place]  [id number] Phone:  [phone] Fax:  DO NOT FAX; please mail          Next level of care provider has access to [facility] Link:no  Safety Planning and Suicide Prevention discussed: Yes,  with pt.  Have you used any form of tobacco in the last 30 days? (Cigarettes, Smokeless Tobacco, Cigars, and/or Pipes): Yes  Has patient been referred to the Quitline?: Patient refused referral  Patient has been referred for addiction treatment: Yes  Jonathon JordanLynn B Orland Visconti, MSW, LCSWA 11/01/2016, 11:41 AM

## 2016-11-02 ENCOUNTER — Inpatient Hospital Stay: Payer: Medicare Other | Admitting: Family Medicine

## 2016-11-02 LAB — T3, FREE: T3, Free: 2.9 pg/mL (ref 2.0–4.4)

## 2016-11-09 ENCOUNTER — Other Ambulatory Visit: Payer: Self-pay | Admitting: Family Medicine

## 2016-11-09 DIAGNOSIS — E1165 Type 2 diabetes mellitus with hyperglycemia: Principal | ICD-10-CM

## 2016-11-09 DIAGNOSIS — IMO0002 Reserved for concepts with insufficient information to code with codable children: Secondary | ICD-10-CM

## 2016-11-09 DIAGNOSIS — E1121 Type 2 diabetes mellitus with diabetic nephropathy: Secondary | ICD-10-CM

## 2016-12-07 ENCOUNTER — Ambulatory Visit: Payer: Medicare Other | Admitting: Family Medicine

## 2017-03-21 DIAGNOSIS — T50901A Poisoning by unspecified drugs, medicaments and biological substances, accidental (unintentional), initial encounter: Secondary | ICD-10-CM | POA: Diagnosis not present

## 2017-03-21 DIAGNOSIS — I2510 Atherosclerotic heart disease of native coronary artery without angina pectoris: Secondary | ICD-10-CM | POA: Diagnosis not present

## 2017-03-21 DIAGNOSIS — J9811 Atelectasis: Secondary | ICD-10-CM | POA: Diagnosis not present

## 2017-03-21 DIAGNOSIS — R400 Somnolence: Secondary | ICD-10-CM | POA: Diagnosis not present

## 2017-03-21 DIAGNOSIS — Z7984 Long term (current) use of oral hypoglycemic drugs: Secondary | ICD-10-CM | POA: Diagnosis not present

## 2017-03-21 DIAGNOSIS — R042 Hemoptysis: Secondary | ICD-10-CM | POA: Diagnosis not present

## 2017-03-21 DIAGNOSIS — R4020 Unspecified coma: Secondary | ICD-10-CM | POA: Diagnosis not present

## 2017-03-21 DIAGNOSIS — I252 Old myocardial infarction: Secondary | ICD-10-CM | POA: Diagnosis not present

## 2017-03-21 DIAGNOSIS — E1169 Type 2 diabetes mellitus with other specified complication: Secondary | ICD-10-CM

## 2017-03-21 DIAGNOSIS — E1142 Type 2 diabetes mellitus with diabetic polyneuropathy: Secondary | ICD-10-CM | POA: Diagnosis not present

## 2017-03-21 DIAGNOSIS — E119 Type 2 diabetes mellitus without complications: Secondary | ICD-10-CM

## 2017-03-21 DIAGNOSIS — I10 Essential (primary) hypertension: Secondary | ICD-10-CM | POA: Diagnosis not present

## 2017-03-21 DIAGNOSIS — R079 Chest pain, unspecified: Secondary | ICD-10-CM | POA: Diagnosis not present

## 2017-03-21 DIAGNOSIS — E785 Hyperlipidemia, unspecified: Secondary | ICD-10-CM | POA: Insufficient documentation

## 2017-03-21 HISTORY — DX: Type 2 diabetes mellitus with other specified complication: E11.69

## 2017-03-21 HISTORY — DX: Type 2 diabetes mellitus without complications: E11.9

## 2017-03-27 DIAGNOSIS — F32A Depression, unspecified: Secondary | ICD-10-CM | POA: Insufficient documentation

## 2017-03-27 HISTORY — DX: Depression, unspecified: F32.A

## 2017-03-28 DIAGNOSIS — E1165 Type 2 diabetes mellitus with hyperglycemia: Secondary | ICD-10-CM | POA: Diagnosis not present

## 2017-04-12 DIAGNOSIS — T50901A Poisoning by unspecified drugs, medicaments and biological substances, accidental (unintentional), initial encounter: Secondary | ICD-10-CM | POA: Diagnosis not present

## 2017-04-12 DIAGNOSIS — I10 Essential (primary) hypertension: Secondary | ICD-10-CM | POA: Diagnosis not present

## 2017-04-12 DIAGNOSIS — R410 Disorientation, unspecified: Secondary | ICD-10-CM | POA: Diagnosis not present

## 2017-04-12 DIAGNOSIS — E785 Hyperlipidemia, unspecified: Secondary | ICD-10-CM | POA: Diagnosis not present

## 2017-04-12 DIAGNOSIS — R0902 Hypoxemia: Secondary | ICD-10-CM | POA: Diagnosis not present

## 2017-04-12 DIAGNOSIS — E119 Type 2 diabetes mellitus without complications: Secondary | ICD-10-CM | POA: Diagnosis not present

## 2017-04-13 DIAGNOSIS — T50901A Poisoning by unspecified drugs, medicaments and biological substances, accidental (unintentional), initial encounter: Secondary | ICD-10-CM | POA: Diagnosis not present

## 2017-04-14 DIAGNOSIS — E119 Type 2 diabetes mellitus without complications: Secondary | ICD-10-CM | POA: Diagnosis not present

## 2017-04-14 DIAGNOSIS — R0902 Hypoxemia: Secondary | ICD-10-CM | POA: Diagnosis not present

## 2017-04-14 DIAGNOSIS — I10 Essential (primary) hypertension: Secondary | ICD-10-CM | POA: Diagnosis not present

## 2017-04-14 DIAGNOSIS — E785 Hyperlipidemia, unspecified: Secondary | ICD-10-CM | POA: Diagnosis not present

## 2017-04-15 DIAGNOSIS — I10 Essential (primary) hypertension: Secondary | ICD-10-CM | POA: Diagnosis not present

## 2017-04-15 DIAGNOSIS — E119 Type 2 diabetes mellitus without complications: Secondary | ICD-10-CM | POA: Diagnosis not present

## 2017-04-15 DIAGNOSIS — E785 Hyperlipidemia, unspecified: Secondary | ICD-10-CM | POA: Diagnosis not present

## 2017-04-16 DIAGNOSIS — I10 Essential (primary) hypertension: Secondary | ICD-10-CM | POA: Diagnosis not present

## 2017-04-16 DIAGNOSIS — E119 Type 2 diabetes mellitus without complications: Secondary | ICD-10-CM | POA: Diagnosis not present

## 2017-04-16 DIAGNOSIS — R0902 Hypoxemia: Secondary | ICD-10-CM | POA: Diagnosis not present

## 2017-05-15 DIAGNOSIS — I714 Abdominal aortic aneurysm, without rupture, unspecified: Secondary | ICD-10-CM

## 2017-05-15 DIAGNOSIS — I2510 Atherosclerotic heart disease of native coronary artery without angina pectoris: Secondary | ICD-10-CM | POA: Insufficient documentation

## 2017-05-15 HISTORY — DX: Atherosclerotic heart disease of native coronary artery without angina pectoris: I25.10

## 2017-05-15 HISTORY — DX: Abdominal aortic aneurysm, without rupture: I71.4

## 2017-05-15 HISTORY — DX: Abdominal aortic aneurysm, without rupture, unspecified: I71.40

## 2017-07-28 DIAGNOSIS — S22080G Wedge compression fracture of T11-T12 vertebra, subsequent encounter for fracture with delayed healing: Secondary | ICD-10-CM

## 2017-07-28 HISTORY — DX: Wedge compression fracture of t11-T12 vertebra, subsequent encounter for fracture with delayed healing: S22.080G

## 2017-08-22 DIAGNOSIS — G4733 Obstructive sleep apnea (adult) (pediatric): Secondary | ICD-10-CM

## 2017-08-22 HISTORY — DX: Obstructive sleep apnea (adult) (pediatric): G47.33

## 2017-10-13 ENCOUNTER — Other Ambulatory Visit: Payer: Self-pay | Admitting: Family Medicine

## 2017-10-13 DIAGNOSIS — E1121 Type 2 diabetes mellitus with diabetic nephropathy: Secondary | ICD-10-CM

## 2017-10-13 DIAGNOSIS — IMO0002 Reserved for concepts with insufficient information to code with codable children: Secondary | ICD-10-CM

## 2017-10-13 DIAGNOSIS — E1165 Type 2 diabetes mellitus with hyperglycemia: Principal | ICD-10-CM

## 2017-10-13 NOTE — Telephone Encounter (Signed)
Patient called, left detailed VM to call the office to schedule a yearly physical appointment, appointment will need to be scheduled and kept before any medications are refilled.

## 2017-11-15 DIAGNOSIS — E860 Dehydration: Secondary | ICD-10-CM

## 2017-11-15 DIAGNOSIS — M549 Dorsalgia, unspecified: Secondary | ICD-10-CM

## 2017-11-15 DIAGNOSIS — R4182 Altered mental status, unspecified: Secondary | ICD-10-CM

## 2017-11-15 DIAGNOSIS — R739 Hyperglycemia, unspecified: Secondary | ICD-10-CM

## 2017-11-15 DIAGNOSIS — I2510 Atherosclerotic heart disease of native coronary artery without angina pectoris: Secondary | ICD-10-CM

## 2017-11-15 DIAGNOSIS — F1210 Cannabis abuse, uncomplicated: Secondary | ICD-10-CM

## 2017-11-16 DIAGNOSIS — T426X1A Poisoning by other antiepileptic and sedative-hypnotic drugs, accidental (unintentional), initial encounter: Secondary | ICD-10-CM

## 2017-12-24 IMAGING — DX DG ELBOW COMPLETE 3+V*L*
4 series · 4 of 4 positions shown · non-contrast
Comparison: November 18, 2008

CLINICAL DATA: Pain and swelling

EXAM:
LEFT ELBOW - COMPLETE 3+ VIEW

[elbow ap]
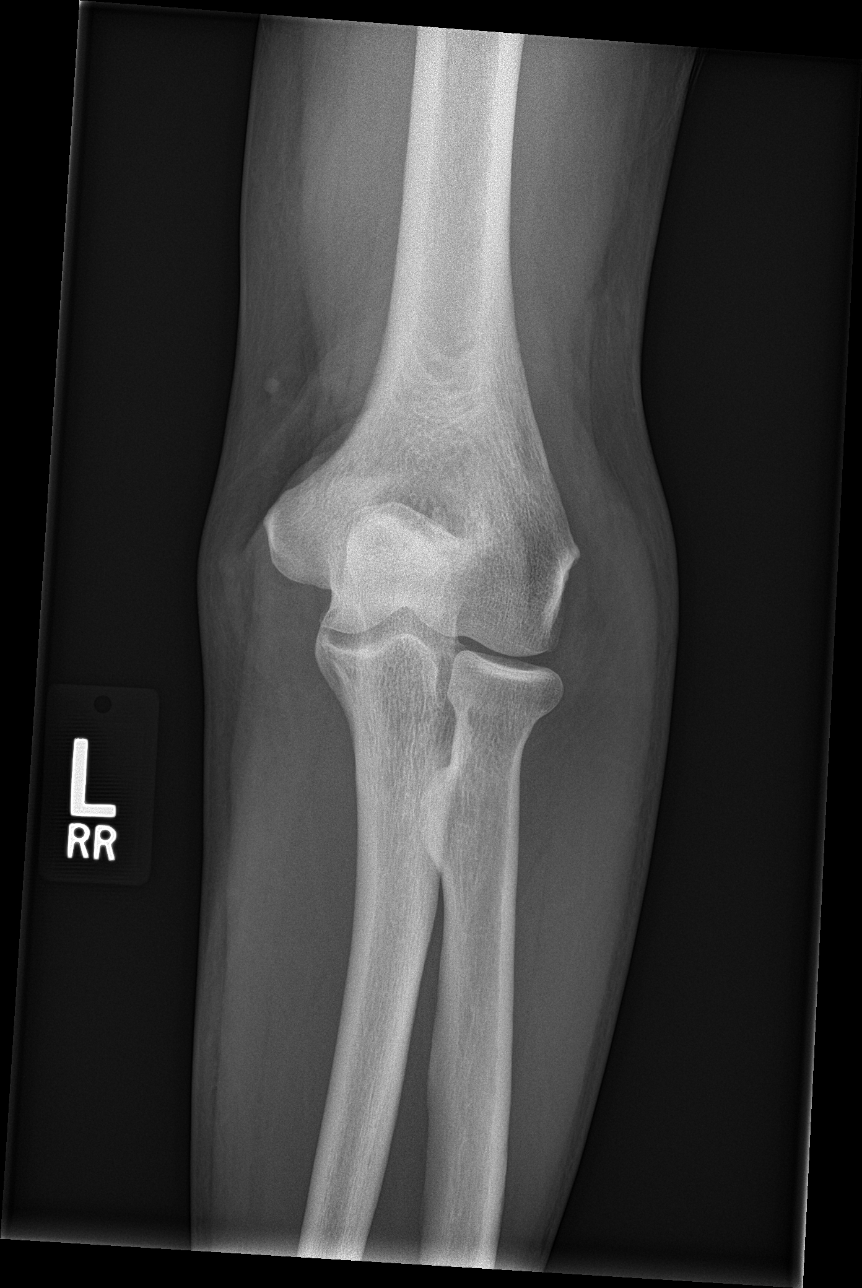

[elbow obl (1 of 2)]
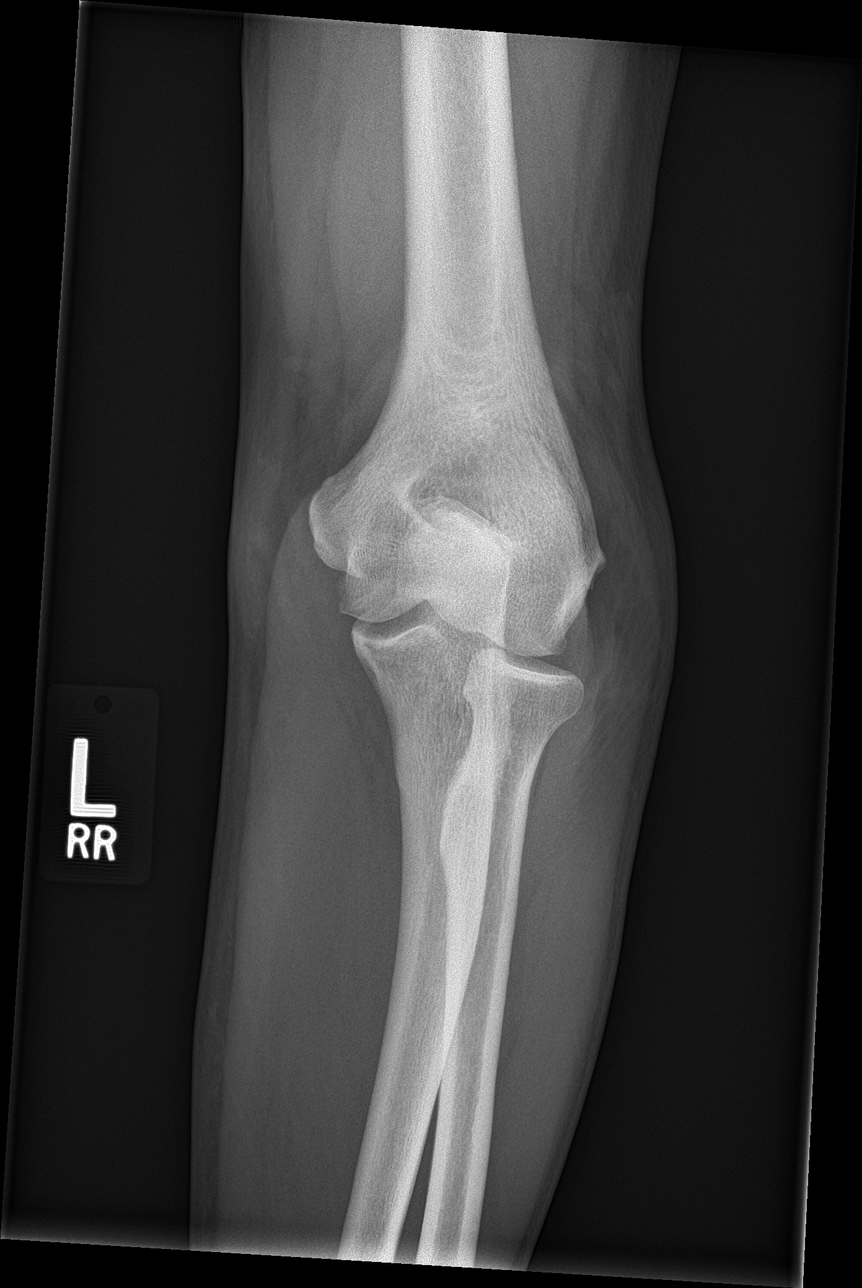

[elbow obl (2 of 2)]
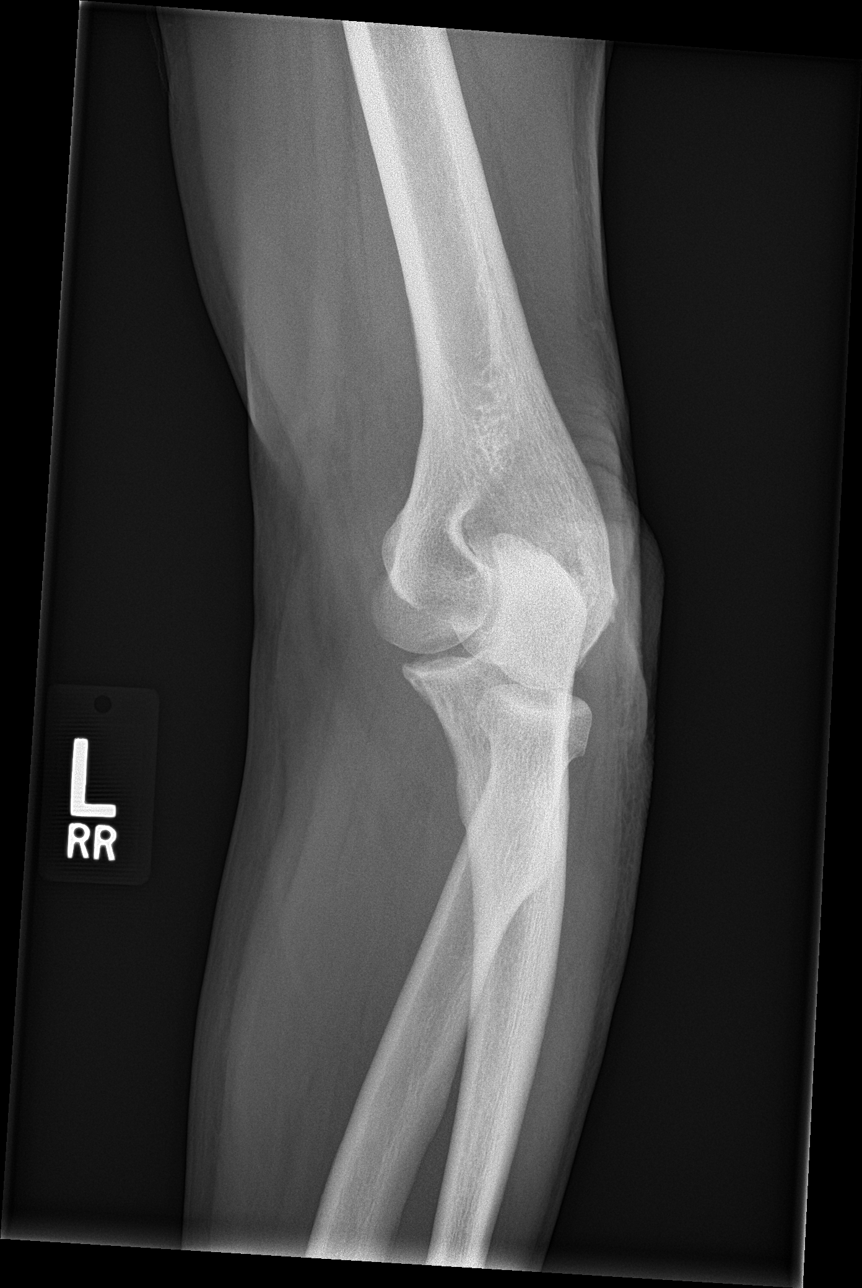

[elbow lat]
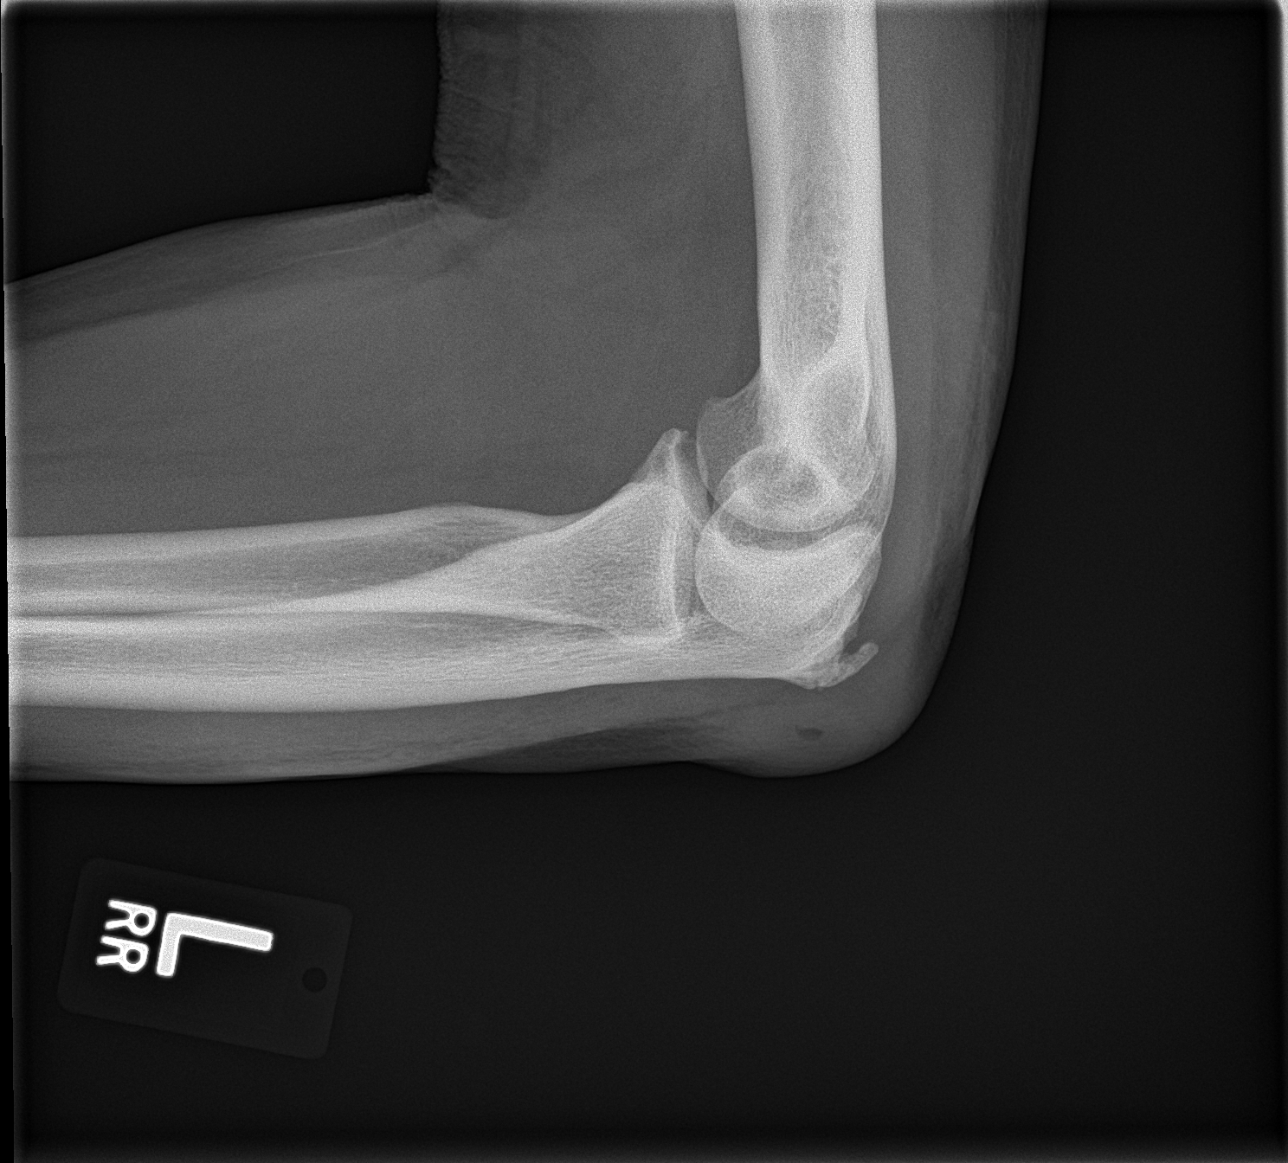

[4 of 4 positions shown; findings below may reference images not displayed]

FINDINGS: Frontal, lateral, and bilateral oblique views were obtained. There
is no fracture or dislocation. No joint effusion.

There is marked soft tissue swelling in the olecranon bursa region
with a questionable small focus of air in this area region. There is
a prominent olecranon spur. There is a smaller coracoid process spur
arising from the proximal ulna. No erosion or appreciable joint
space narrowing.
IMPRESSION: Olecranon bursa soft tissue swelling with questionable small focus
of air. Infection in this bursa must be of concern. No bony
destruction. Prominent olecranon process spur with a smaller
coracoid process spur. No joint space narrowing or erosion. No
fracture or dislocation.

These results will be called to the ordering clinician or
representative by the Radiologist Assistant, and communication
documented in the PACS or zVision Dashboard.

## 2018-01-12 DIAGNOSIS — M25511 Pain in right shoulder: Secondary | ICD-10-CM

## 2018-01-12 DIAGNOSIS — G8911 Acute pain due to trauma: Secondary | ICD-10-CM | POA: Insufficient documentation

## 2018-01-12 DIAGNOSIS — M545 Low back pain, unspecified: Secondary | ICD-10-CM

## 2018-01-12 HISTORY — DX: Low back pain, unspecified: M54.50

## 2018-01-12 HISTORY — DX: Acute pain due to trauma: G89.11

## 2018-01-12 HISTORY — DX: Acute pain due to trauma: M25.511

## 2018-03-22 DIAGNOSIS — F908 Attention-deficit hyperactivity disorder, other type: Secondary | ICD-10-CM

## 2018-03-22 DIAGNOSIS — F0670 Mild neurocognitive disorder due to known physiological condition without behavioral disturbance: Secondary | ICD-10-CM

## 2018-03-22 DIAGNOSIS — G3184 Mild cognitive impairment, so stated: Secondary | ICD-10-CM

## 2018-03-22 HISTORY — DX: Attention-deficit hyperactivity disorder, other type: F90.8

## 2018-03-22 HISTORY — DX: Mild cognitive impairment, so stated: G31.84

## 2018-03-22 HISTORY — DX: Mild neurocognitive disorder due to known physiological condition without behavioral disturbance: F06.70

## 2018-06-22 DIAGNOSIS — R079 Chest pain, unspecified: Secondary | ICD-10-CM

## 2018-06-22 DIAGNOSIS — E785 Hyperlipidemia, unspecified: Secondary | ICD-10-CM

## 2018-06-22 DIAGNOSIS — E119 Type 2 diabetes mellitus without complications: Secondary | ICD-10-CM

## 2018-06-22 DIAGNOSIS — I2510 Atherosclerotic heart disease of native coronary artery without angina pectoris: Secondary | ICD-10-CM

## 2018-06-22 DIAGNOSIS — I10 Essential (primary) hypertension: Secondary | ICD-10-CM

## 2018-06-23 DIAGNOSIS — I10 Essential (primary) hypertension: Secondary | ICD-10-CM | POA: Diagnosis not present

## 2018-06-23 DIAGNOSIS — E785 Hyperlipidemia, unspecified: Secondary | ICD-10-CM | POA: Diagnosis not present

## 2018-06-23 DIAGNOSIS — E119 Type 2 diabetes mellitus without complications: Secondary | ICD-10-CM | POA: Diagnosis not present

## 2018-06-23 DIAGNOSIS — R079 Chest pain, unspecified: Secondary | ICD-10-CM | POA: Diagnosis not present

## 2018-12-16 ENCOUNTER — Other Ambulatory Visit: Payer: Self-pay

## 2018-12-16 ENCOUNTER — Emergency Department (HOSPITAL_COMMUNITY)
Admission: EM | Admit: 2018-12-16 | Discharge: 2018-12-17 | Disposition: A | Discharge status: Home / Self Care | Payer: Medicare Other | Attending: Emergency Medicine | Admitting: Emergency Medicine

## 2018-12-16 ENCOUNTER — Encounter (HOSPITAL_COMMUNITY): Payer: Self-pay

## 2018-12-16 DIAGNOSIS — I509 Heart failure, unspecified: Secondary | ICD-10-CM | POA: Insufficient documentation

## 2018-12-16 DIAGNOSIS — R350 Frequency of micturition: Secondary | ICD-10-CM | POA: Diagnosis present

## 2018-12-16 DIAGNOSIS — I110 Hypertensive heart disease with heart failure: Secondary | ICD-10-CM | POA: Insufficient documentation

## 2018-12-16 DIAGNOSIS — Z955 Presence of coronary angioplasty implant and graft: Secondary | ICD-10-CM | POA: Insufficient documentation

## 2018-12-16 DIAGNOSIS — E1165 Type 2 diabetes mellitus with hyperglycemia: Secondary | ICD-10-CM | POA: Diagnosis not present

## 2018-12-16 DIAGNOSIS — I2510 Atherosclerotic heart disease of native coronary artery without angina pectoris: Secondary | ICD-10-CM | POA: Diagnosis not present

## 2018-12-16 DIAGNOSIS — J449 Chronic obstructive pulmonary disease, unspecified: Secondary | ICD-10-CM | POA: Diagnosis not present

## 2018-12-16 DIAGNOSIS — F17210 Nicotine dependence, cigarettes, uncomplicated: Secondary | ICD-10-CM | POA: Insufficient documentation

## 2018-12-16 DIAGNOSIS — Z79899 Other long term (current) drug therapy: Secondary | ICD-10-CM | POA: Diagnosis not present

## 2018-12-16 DIAGNOSIS — Z7984 Long term (current) use of oral hypoglycemic drugs: Secondary | ICD-10-CM | POA: Insufficient documentation

## 2018-12-16 DIAGNOSIS — R739 Hyperglycemia, unspecified: Secondary | ICD-10-CM

## 2018-12-16 LAB — CBG MONITORING, ED: Glucose-Capillary: 521 mg/dL (ref 70–99)

## 2018-12-16 MED ORDER — SODIUM CHLORIDE 0.9 % IV BOLUS
1000.0000 mL | Freq: Once | INTRAVENOUS | Status: AC
  Administered 2018-12-16: 1000 mL via INTRAVENOUS

## 2018-12-16 MED ORDER — ONDANSETRON HCL 4 MG/2ML IJ SOLN
4.0000 mg | Freq: Once | INTRAMUSCULAR | Status: AC
  Administered 2018-12-16: 4 mg via INTRAVENOUS
  Filled 2018-12-16: qty 2

## 2018-12-16 NOTE — ED Provider Notes (Signed)
[facility] DEPT Provider Note   CSN: [id number] Arrival date & time: 12/16/18  2309     History   Chief Complaint Chief Complaint  Patient presents with   Hyperglycemia    HPI [name] Newton is a 58 y.o. male.     The history is provided by the patient and medical records.  Hyperglycemia Associated symptoms: polyuria      58 year old male with history of asthma, arthritis, CHF with EF of 55 to 65%, COPD, diabetes, hyperlipidemia, hypertension, presenting to the ED with hyperglycemia.  Patient reports recently he has had some cramping in his hands and today was found to have sugar of 589 with EMS.  States he has been out of his diabetic medications for several months as he has been unable to follow-up with his primary care doctor.  He was previously taking metformin.  He has noticed some urinary frequency and nausea but denies any vomiting.  He has not had any diarrhea.  Denies any prior hospitalizations for DKA.  Past Medical History:  Diagnosis Date   Arthritis    Asthma    CHF (congestive heart failure) ([place])    Complication of anesthesia 09/16/2016   in 1998 patient dislocated his shoulder and was told he should never be "put under" again because he was very difficult to awake from anesthesia. He is very concerned about this   COPD (chronic obstructive pulmonary disease) (HCC)    Diabetes mellitus    Guaiac positive stools     Guaiac positive stool with stable H and H.    Hyperlipidemia    Hypertension    NSTEMI (non-ST elevated myocardial infarction) [facility]) April 2012   status post successful PCI and bare-metal  stenting of the first diagonal this admission.    Tobacco abuse     Patient Active Problem List   Diagnosis Date Noted   MDD (major depressive disorder), recurrent severe, without psychosis ([place]) 10/28/2016   Effusion of bursa of elbow, left 09/08/2016   Diabetic autonomic neuropathy associated with  type 2 diabetes mellitus ([place]) 07/06/2016   Dyslipidemia 07/06/2016   Rash 02/15/2016   Stable angina ([place]) 02/15/2016   Hyperlipidemia 11/23/2015   Essential hypertension 07/26/2015   Erectile dysfunction 07/26/2015   Healthcare maintenance 07/15/2014   GERD (gastroesophageal reflux disease) 07/24/2013   Tobacco abuse 12/03/2012   Fatty infiltration of liver 07/18/2012   COPD (chronic obstructive pulmonary disease) ([place]) 07/04/2012   Hypertriglyceridemia    Coronary artery disease    NSTEMI (non-ST elevated myocardial infarction) ([place]) 08/29/2010   Diabetes type 2, uncontrolled with neuropathy 05/30/2010    Past Surgical History:  Procedure Laterality Date   CARDIAC CATHETERIZATION     Normal EF. 90% diagonal lesion   CLOSED REDUCTION SHOULDER DISLOCATION Right 1998   CORONARY STENT PLACEMENT  April 2012   Diagonal lesion. Bare metal   OLECRANON BURSECTOMY Left 09/20/2016   Procedure: EXCISION OLECRANON BURSA;  Surgeon: Meredith Pel, MD;  Location: [facility];  Service: Orthopedics;  Laterality: Left;   UMBILICAL HERNIA REPAIR          Home Medications    Prior to Admission medications   Medication Sig Start Date End Date Taking? Authorizing Provider  ADVAIR DISKUS 100-50 MCG/DOSE AEPB USE 1 INHALATION TWO TIMES  DAILY 11/09/16   Forrest Moron, MD  albuterol (PROAIR HFA) 108 (90 Base) MCG/ACT inhaler Inhale 2 puffs into the lungs every 6 (six) hours as needed for wheezing or shortness of  breath. 11/01/16   Lindell Spar I, NP  atorvastatin (LIPITOR) 40 MG tablet TAKE 1 TABLET BY MOUTH  DAILY 11/09/16   Delia Chimes A, MD  buPROPion (WELLBUTRIN XL) 150 MG 24 hr tablet Take 1 tablet (150 mg total) by mouth daily. For depression 11/02/16   Lindell Spar I, NP  hydrOXYzine (ATARAX/VISTARIL) 25 MG tablet Take 1 tablet (25 mg total) by mouth 3 (three) times daily as needed for anxiety. 11/01/16   Lindell Spar I, NP  Insulin Pen Needle (PEN NEEDLES 31GX5/16") 31G X  8 MM MISC Use to give insulin at bedtime. E11.65: For blood sugar monitoring. 11/01/16   Lindell Spar I, NP  Lancets Misc. (ACCU-CHEK SOFTCLIX LANCET DEV) KIT 100 lancets Use to test blood glucose daily. E11.9: For blood sugar monitoring 11/01/16   Lindell Spar I, NP  metFORMIN (GLUCOPHAGE) 1000 MG tablet Take 1 tablet (1,000 mg total) by mouth 2 (two) times daily with a meal. For diabetes 11/01/16   Lindell Spar I, NP  metoprolol tartrate (LOPRESSOR) 25 MG tablet TAKE ONE-HALF TABLET BY  MOUTH TWICE A DAY 11/09/16   Delia Chimes A, MD  nitroGLYCERIN (NITROSTAT) 0.4 MG SL tablet DISSOLVE 1 TABLET UNDER THE TONGUE EVERY 5 MINUTES AS  NEEDED: FOR CHEST PAIN 11/01/16   Lindell Spar I, NP  traZODone (DESYREL) 50 MG tablet Take 1 tablet (50 mg total) by mouth at bedtime as needed for sleep. 11/01/16   Encarnacion Slates, NP    Family History Family History  Problem Relation Age of Onset   Heart attack Father    Coronary artery disease Brother    Heart attack Brother     Social History Social History   Tobacco Use   Smoking status: Current Some Day Smoker    Packs/day: 2.00    Types: Cigarettes   Smokeless tobacco: Former Systems developer    Types: Chew   Tobacco comment: Patches do not work.  Cutting back.  Wants Chantix  Substance Use Topics   Alcohol use: No    Alcohol/week: 0.0 standard drinks   Drug use: Yes    Types: Marijuana     Allergies   Codeine   Review of Systems Review of Systems  Endocrine: Positive for polyuria.       Hyperglycemia  All other systems reviewed and are negative.    Physical Exam Updated Vital Signs BP (!) 143/75 (BP Location: Left Arm)    Pulse (!) 104    Temp 98.5 F (36.9 C) (Oral)    Resp 20    Ht 6' 4"  (1.93 m)    Wt 95.3 kg    SpO2 99%    BMI 25.56 kg/m   Physical Exam Vitals signs and nursing note reviewed.  Constitutional:      Appearance: He is well-developed.  HENT:     Head: Normocephalic and atraumatic.  Eyes:     Conjunctiva/sclera:  Conjunctivae normal.     Pupils: Pupils are equal, round, and reactive to light.  Neck:     Musculoskeletal: Normal range of motion.  Cardiovascular:     Rate and Rhythm: Normal rate and regular rhythm.     Heart sounds: Normal heart sounds.  Pulmonary:     Effort: Pulmonary effort is normal.     Breath sounds: Normal breath sounds.  Abdominal:     General: Bowel sounds are normal.     Palpations: Abdomen is soft.  Musculoskeletal: Normal range of motion.     Comments: Hand normal  in appearance, making fists on and off during exam, fingers moving normally, no ridigity  Skin:    General: Skin is warm and dry.  Neurological:     Mental Status: He is alert and oriented to person, place, and time.      ED Treatments / Results  Labs (all labs ordered are listed, but only abnormal results are displayed) Labs Reviewed  CBC WITH DIFFERENTIAL/PLATELET - Abnormal; Notable for the following components:      Result Value   Hemoglobin 12.9 (*)    All other components within normal limits  URINALYSIS, ROUTINE W REFLEX MICROSCOPIC - Abnormal; Notable for the following components:   Color, Urine STRAW (*)    Glucose, UA >=500 (*)    Hgb urine dipstick SMALL (*)    All other components within normal limits  BASIC METABOLIC PANEL - Abnormal; Notable for the following components:   Sodium 132 (*)    Chloride 97 (*)    CO2 20 (*)    Glucose, Bld 494 (*)    BUN 27 (*)    Creatinine, Ser 1.67 (*)    GFR calc non Af Amer 44 (*)    GFR calc Af Amer 51 (*)    All other components within normal limits  CBG MONITORING, ED - Abnormal; Notable for the following components:   Glucose-Capillary 521 (*)    All other components within normal limits  CBG MONITORING, ED - Abnormal; Notable for the following components:   Glucose-Capillary 331 (*)    All other components within normal limits  CBG MONITORING, ED - Abnormal; Notable for the following components:   Glucose-Capillary 168 (*)    All  other components within normal limits  MAGNESIUM    EKG None  Radiology No results found.  Procedures Procedures (including critical care time)  Medications Ordered in ED Medications  sodium chloride 0.9 % bolus 1,000 mL (0 mLs Intravenous Stopped 12/17/18 0044)  ondansetron (ZOFRAN) injection 4 mg (4 mg Intravenous Given 12/16/18 2345)  sodium chloride 0.9 % bolus 1,000 mL (0 mLs Intravenous Stopped 12/17/18 0244)  insulin aspart (novoLOG) injection 5 Units (5 Units Intravenous Given 12/17/18 0313)  sodium chloride 0.9 % bolus 1,000 mL (1,000 mLs Intravenous New Bag/Given 12/17/18 0312)     Initial Impression / Assessment and Plan / ED Course  I have reviewed the triage vital signs and the nursing notes.  Pertinent labs & imaging results that were available during my care of the patient were reviewed by me and considered in my medical decision making (see chart for details).  58 year old male here with hyperglycemia.  He has been off of his medications for several months now as he has been unable to follow-up with PCP.  Reports some cramping in the hands and frequent urination.  He is afebrile and nontoxic, vitals are stable.  Labs with hyperglycemia but no evidence of DKA.  He also has a mild AKI which I suspect is from dehydration.  He was aggressively fluid resuscitated here and given small bolus of insulin with improvement of glucose to 168.  Feel he is stable for discharge home.  We have restarted his metformin.  He will need close follow-up with PCP.  Return here for any new or acute changes.  Final Clinical Impressions(s) / ED Diagnoses   Final diagnoses:  Hyperglycemia    ED Discharge Orders         Ordered    metFORMIN (GLUCOPHAGE) 500 MG tablet  2 times daily  12/17/18 0510           Larene Pickett, PA-C 12/17/18 0272    Randal Buba, April, MD 12/17/18 5366

## 2018-12-16 NOTE — ED Triage Notes (Signed)
Patient complaining of cramping in both hands that started about one hour ago. Denies any pain at this time. Patient CBG was 589 with EMS. Patient has a history of hypertension and diabetes and reports to have been out of medication for months as he no longer has a PCP.

## 2018-12-17 DIAGNOSIS — E1165 Type 2 diabetes mellitus with hyperglycemia: Secondary | ICD-10-CM | POA: Diagnosis not present

## 2018-12-17 LAB — CBC WITH DIFFERENTIAL/PLATELET
Abs Immature Granulocytes: 0.03 10*3/uL (ref 0.00–0.07)
Basophils Absolute: 0 10*3/uL (ref 0.0–0.1)
Basophils Relative: 0 %
Eosinophils Absolute: 0.3 10*3/uL (ref 0.0–0.5)
Eosinophils Relative: 3 %
HCT: 40.2 % (ref 39.0–52.0)
Hemoglobin: 12.9 g/dL — ABNORMAL LOW (ref 13.0–17.0)
Immature Granulocytes: 0 %
Lymphocytes Relative: 24 %
Lymphs Abs: 2.4 10*3/uL (ref 0.7–4.0)
MCH: 29.5 pg (ref 26.0–34.0)
MCHC: 32.1 g/dL (ref 30.0–36.0)
MCV: 91.8 fL (ref 80.0–100.0)
Monocytes Absolute: 0.7 10*3/uL (ref 0.1–1.0)
Monocytes Relative: 7 %
Neutro Abs: 6.6 10*3/uL (ref 1.7–7.7)
Neutrophils Relative %: 66 %
Platelets: 257 10*3/uL (ref 150–400)
RBC: 4.38 MIL/uL (ref 4.22–5.81)
RDW: 14.1 % (ref 11.5–15.5)
WBC: 9.9 10*3/uL (ref 4.0–10.5)
nRBC: 0 % (ref 0.0–0.2)

## 2018-12-17 LAB — BASIC METABOLIC PANEL
Anion gap: 15 (ref 5–15)
BUN: 27 mg/dL — ABNORMAL HIGH (ref 6–20)
CO2: 20 mmol/L — ABNORMAL LOW (ref 22–32)
Calcium: 9.1 mg/dL (ref 8.9–10.3)
Chloride: 97 mmol/L — ABNORMAL LOW (ref 98–111)
Creatinine, Ser: 1.67 mg/dL — ABNORMAL HIGH (ref 0.61–1.24)
GFR calc Af Amer: 51 mL/min — ABNORMAL LOW (ref >60)
GFR calc non Af Amer: 44 mL/min — ABNORMAL LOW (ref >60)
Glucose, Bld: 494 mg/dL — ABNORMAL HIGH (ref 70–99)
Potassium: 4 mmol/L (ref 3.5–5.1)
Sodium: 132 mmol/L — ABNORMAL LOW (ref 135–145)

## 2018-12-17 LAB — URINALYSIS, ROUTINE W REFLEX MICROSCOPIC
Bacteria, UA: NONE SEEN
Bilirubin Urine: NEGATIVE
Glucose, UA: >=500 mg/dL — AB
Ketones, ur: NEGATIVE mg/dL
Leukocytes,Ua: NEGATIVE
Nitrite: NEGATIVE
Protein, ur: NEGATIVE mg/dL
Specific Gravity, Urine: 1.022 (ref 1.005–1.030)
pH: 5 (ref 5.0–8.0)

## 2018-12-17 LAB — MAGNESIUM: Magnesium: 1.9 mg/dL (ref 1.7–2.4)

## 2018-12-17 LAB — CBG MONITORING, ED
Glucose-Capillary: 331 mg/dL — ABNORMAL HIGH (ref 70–99)
Glucose-Capillary: 168 mg/dL — ABNORMAL HIGH (ref 70–99)

## 2018-12-17 MED ORDER — SODIUM CHLORIDE 0.9 % IV BOLUS
1000.0000 mL | Freq: Once | INTRAVENOUS | Status: AC
  Administered 2018-12-17: 1000 mL via INTRAVENOUS

## 2018-12-17 MED ORDER — INSULIN ASPART 100 UNIT/ML ~~LOC~~ SOLN
5.0000 [IU] | Freq: Once | SUBCUTANEOUS | Status: AC
  Administered 2018-12-17: 03:00:00 5 [IU] via INTRAVENOUS
  Filled 2018-12-17: qty 0.05

## 2018-12-17 MED ORDER — METFORMIN HCL 500 MG PO TABS: 500.0000 mg | ORAL_TABLET | Freq: Two times a day (BID) | ORAL | 0 refills | Status: DC

## 2018-12-17 NOTE — Discharge Instructions (Signed)
Take the prescribed medication as directed.  Make sure to watch your sugar/carb intake.  Try to and drink plenty of water. Follow-up with your primary care doctor for ongoing management. Return to the ED for new or worsening symptoms.

## 2018-12-17 NOTE — ED Notes (Signed)
Patient made aware that urine sample is needed. Urinal provided.

## 2018-12-19 ENCOUNTER — Encounter (HOSPITAL_COMMUNITY): Payer: Self-pay

## 2018-12-19 ENCOUNTER — Other Ambulatory Visit: Payer: Self-pay

## 2018-12-19 DIAGNOSIS — L0201 Cutaneous abscess of face: Secondary | ICD-10-CM | POA: Diagnosis present

## 2018-12-19 DIAGNOSIS — Z5321 Procedure and treatment not carried out due to patient leaving prior to being seen by health care provider: Secondary | ICD-10-CM | POA: Insufficient documentation

## 2018-12-19 NOTE — ED Triage Notes (Signed)
Pt reports that he wants some antibiotics for an abscess on his chin. A&Ox4.

## 2018-12-20 ENCOUNTER — Emergency Department (HOSPITAL_COMMUNITY)
Admission: EM | Admit: 2018-12-20 | Discharge: 2018-12-20 | Disposition: A | Discharge status: Home / Self Care | Payer: Medicare Other | Attending: Emergency Medicine | Admitting: Emergency Medicine

## 2019-04-05 ENCOUNTER — Ambulatory Visit: Payer: Medicare Other | Admitting: Cardiology

## 2019-05-10 ENCOUNTER — Telehealth: Payer: Self-pay

## 2019-05-10 NOTE — Telephone Encounter (Signed)
NOTES ON FILE FROM [facility] [phone], SENT REFERRAL TO SCHEDULING 

## 2019-05-14 ENCOUNTER — Encounter: Payer: Self-pay | Admitting: Cardiology

## 2019-05-14 ENCOUNTER — Other Ambulatory Visit: Payer: Self-pay

## 2019-05-14 ENCOUNTER — Ambulatory Visit (INDEPENDENT_AMBULATORY_CARE_PROVIDER_SITE_OTHER): Payer: Medicare Other | Admitting: Cardiology

## 2019-05-14 VITALS — BP 108/66 | HR 97 | Ht 76.0 in | Wt 195.8 lb

## 2019-05-14 DIAGNOSIS — E1143 Type 2 diabetes mellitus with diabetic autonomic (poly)neuropathy: Secondary | ICD-10-CM | POA: Diagnosis not present

## 2019-05-14 DIAGNOSIS — F17210 Nicotine dependence, cigarettes, uncomplicated: Secondary | ICD-10-CM

## 2019-05-14 DIAGNOSIS — I2510 Atherosclerotic heart disease of native coronary artery without angina pectoris: Secondary | ICD-10-CM | POA: Diagnosis not present

## 2019-05-14 DIAGNOSIS — E782 Mixed hyperlipidemia: Secondary | ICD-10-CM

## 2019-05-14 DIAGNOSIS — Z720 Tobacco use: Secondary | ICD-10-CM | POA: Diagnosis not present

## 2019-05-14 DIAGNOSIS — I10 Essential (primary) hypertension: Secondary | ICD-10-CM | POA: Diagnosis not present

## 2019-05-14 HISTORY — DX: Mixed hyperlipidemia: E78.2

## 2019-05-14 NOTE — Progress Notes (Signed)
Cardiology Office Note:    Date:  05/14/2019   ID:  [name] Newton, DOB 25-May-1961, MRN [id number]  PCP:  Lise AuerKhan, Jaber A, MD  Cardiologist:  Garwin Brothersajan R Saheed Carrington, MD   Referring MD: Drinda ButtsKeller, Mark Robert, MD    ASSESSMENT:    1. Coronary artery disease involving native coronary artery of native heart without angina pectoris   2. Essential hypertension   3. Tobacco abuse   4. Diabetic autonomic neuropathy associated with type 2 diabetes mellitus (HCC)   5. Mixed dyslipidemia    PLAN:    In order of problems listed above:  1. Coronary artery disease: Secondary prevention stressed with the patient.  Importance of compliance with diet and medication stressed and he vocalized understanding.  Importance of regular exercise stressed.  His effort tolerance is excellent.  He does have nitroglycerin and knows how to use it. 2. Essential hypertension: Blood pressure is stable 3. Mixed dyslipidemia and diabetes mellitus: I am awaiting records from primary care physician and I will review them.  Diet was discussed. 4. Cigarette smoker: I spent 5 minutes with the patient discussing solely about smoking. Smoking cessation was counseled. I suggested to the patient also different medications and pharmacological interventions. Patient is keen to try stopping on its own at this time. He will get back to me if he needs any further assistance in this matter. 5. Patient will be seen in follow-up appointment in 6 months or earlier if the patient has any concerns    Medication Adjustments/Labs and Tests Ordered: Current medicines are reviewed at length with the patient today.  Concerns regarding medicines are outlined above.  No orders of the defined types were placed in this encounter.  No orders of the defined types were placed in this encounter.    History of Present Illness:    [name] FlingChristopher M Lish is a 58 y.o. male who is being seen today for the evaluation of coronary artery disease and  to get established at the request of Drinda ButtsKeller, Mark Robert, MD.  Patient is a pleasant 58 year old male.  He has past medical history of coronary artery disease post intervention about 10 years ago.  Patient mentions to me that he has history of essential hypertension, dyslipidemia and diabetes mellitus.  Unfortunately continues to smoke.  He mentions to me that he is here to be established.  He walks about 2 miles a day without any problems.  No chest pain orthopnea or PND.  He does have nitroglycerin with him.  At the time of my evaluation, the patient is alert awake oriented and in no distress.  Past Medical History:  Diagnosis Date  . Arthritis   . Asthma   . CHF (congestive heart failure) (HCC)   . Complication of anesthesia 09/16/2016   in 1998 patient dislocated his shoulder and was told he should never be "put under" again because he was very difficult to awake from anesthesia. He is very concerned about this  . COPD (chronic obstructive pulmonary disease) (HCC)   . Diabetes mellitus   . Guaiac positive stools     Guaiac positive stool with stable H and H.   . Hyperlipidemia   . Hypertension   . NSTEMI (non-ST elevated myocardial infarction) [facility]) April 2012   status post successful PCI and bare-metal  stenting of the first diagonal this admission.   . Tobacco abuse     Past Surgical History:  Procedure Laterality Date  . CARDIAC CATHETERIZATION     Normal EF.  90% diagonal lesion  . CLOSED REDUCTION SHOULDER DISLOCATION Right 1998  . CORONARY STENT PLACEMENT  April 2012   Diagonal lesion. Bare metal  . OLECRANON BURSECTOMY Left 09/20/2016   Procedure: EXCISION OLECRANON BURSA;  Surgeon: Cammy Copa, MD;  Location: [facility] OR;  Service: Orthopedics;  Laterality: Left;  . UMBILICAL HERNIA REPAIR      Current Medications: Current Meds  Medication Sig  . ADVAIR DISKUS 100-50 MCG/DOSE AEPB USE 1 INHALATION TWO TIMES  DAILY  . albuterol (PROAIR HFA) 108 (90 Base) MCG/ACT inhaler  Inhale 2 puffs into the lungs every 6 (six) hours as needed for wheezing or shortness of breath.  Marland Kitchen atorvastatin (LIPITOR) 40 MG tablet TAKE 1 TABLET BY MOUTH  DAILY  . metFORMIN (GLUCOPHAGE) 500 MG tablet Take 1 tablet (500 mg total) by mouth 2 (two) times daily.  . nitroGLYCERIN (NITROSTAT) 0.4 MG SL tablet DISSOLVE 1 TABLET UNDER THE TONGUE EVERY 5 MINUTES AS  NEEDED: FOR CHEST PAIN     Allergies:   Codeine   Social History   Socioeconomic History  . Marital status: Divorced    Spouse name: Not on file  . Number of children: Not on file  . Years of education: 69  . Highest education level: Not on file  Occupational History  . Occupation: none    Comment: Unemployed since 2012  Tobacco Use  . Smoking status: Current Some Day Smoker    Packs/day: 2.00    Types: Cigarettes  . Smokeless tobacco: Former Neurosurgeon    Types: Chew  . Tobacco comment: Patches do not work.  Cutting back.  Wants Chantix  Substance and Sexual Activity  . Alcohol use: No    Alcohol/week: 0.0 standard drinks  . Drug use: Yes    Types: Marijuana  . Sexual activity: Yes    Birth control/protection: None  Other Topics Concern  . Not on file  Social History Narrative   Patient is intermittently homeless without any income at the moment.          Social Determinants of Health   Financial Resource Strain:   . Difficulty of Paying Living Expenses: Not on file  Food Insecurity:   . Worried About Programme researcher, broadcasting/film/video in the Last Year: Not on file  . Ran Out of Food in the Last Year: Not on file  Transportation Needs:   . Lack of Transportation (Medical): Not on file  . Lack of Transportation (Non-Medical): Not on file  Physical Activity:   . Days of Exercise per Week: Not on file  . Minutes of Exercise per Session: Not on file  Stress:   . Feeling of Stress : Not on file  Social Connections:   . Frequency of Communication with Friends and Family: Not on file  . Frequency of Social Gatherings with  Friends and Family: Not on file  . Attends Religious Services: Not on file  . Active Member of Clubs or Organizations: Not on file  . Attends Banker Meetings: Not on file  . Marital Status: Not on file     Family History: The patient's family history includes Coronary artery disease in his brother; Heart attack in his brother and father.  ROS:   Please see the history of present illness.    All other systems reviewed and are negative.  EKGs/Labs/Other Studies Reviewed:    The following studies were reviewed today: EKG reveals sinus rhythm and nonspecific ST-T changes   Recent Labs: 12/16/2018: BUN  27; Creatinine, Ser 1.67; Hemoglobin 12.9; Magnesium 1.9; Platelets 257; Potassium 4.0; Sodium 132  Recent Lipid Panel    Component Value Date/Time   CHOL 225 (H) 10/30/2016 0616   CHOL 380 (H) 07/06/2016 1324   TRIG 217 (H) 10/30/2016 0616   HDL 35 (L) 10/30/2016 0616   HDL 32 (L) 07/06/2016 1324   CHOLHDL 6.4 10/30/2016 0616   VLDL 43 (H) 10/30/2016 0616   LDLCALC 147 (H) 10/30/2016 0616   LDLCALC Comment 07/06/2016 1324   LDLDIRECT 152 (H) 12/16/2014 0249    Physical Exam:    VS:  BP 108/66 (BP Location: Right Arm, Patient Position: Sitting, Cuff Size: Normal)   Pulse 97   Ht 6\' 4"  (1.93 m)   Wt 195 lb 12.8 oz (88.8 kg)   SpO2 98%   BMI 23.83 kg/m     Wt Readings from Last 3 Encounters:  05/14/19 195 lb 12.8 oz (88.8 kg)  12/16/18 210 lb (95.3 kg)  10/27/16 197 lb (89.4 kg)     GEN: Patient is in no acute distress HEENT: Normal NECK: No JVD; No carotid bruits LYMPHATICS: No lymphadenopathy CARDIAC: S1 S2 regular, 2/6 systolic murmur at the apex. RESPIRATORY:  Clear to auscultation without rales, wheezing or rhonchi  ABDOMEN: Soft, non-tender, non-distended MUSCULOSKELETAL:  No edema; No deformity  SKIN: Warm and dry NEUROLOGIC:  Alert and oriented x 3 PSYCHIATRIC:  Normal affect    Signed, Jenean Lindau, MD  05/14/2019 2:53 PM    [place]

## 2019-05-14 NOTE — Patient Instructions (Signed)
Medication Instructions:  Your physician recommends that you continue on your current medications as directed. Please refer to the Current Medication list given to you today. ] *If you need a refill on your cardiac medications before your next appointment, please call your pharmacy*  Lab Work: None Ordered   Testing/Procedures: Your physician has requested that you have an echocardiogram. Echocardiography is a painless test that uses sound waves to create images of your heart. It provides your doctor with information about the size and shape of your heart and how well your heart's chambers and valves are working. This procedure takes approximately one hour. There are no restrictions for this procedure.   Follow-Up: At [facility], you and your health needs are our priority.  As part of our continuing mission to provide you with exceptional heart care, we have created designated Provider Care Teams.  These Care Teams include your primary Cardiologist (physician) and Advanced Practice Providers (APPs -  Physician Assistants and Nurse Practitioners) who all work together to provide you with the care you need, when you need it.  Your next appointment:   6 month(s)  The format for your next appointment:   In Person  Provider:   Rajan Revankar, MD  Other Instructions  Echocardiogram An echocardiogram is a procedure that uses painless sound waves (ultrasound) to produce an image of the heart. Images from an echocardiogram can provide important information about:  Signs of coronary artery disease (CAD).  Aneurysm detection. An aneurysm is a weak or damaged part of an artery wall that bulges out from the normal force of blood pumping through the body.  Heart size and shape. Changes in the size or shape of the heart can be associated with certain conditions, including heart failure, aneurysm, and CAD.  Heart muscle function.  Heart valve function.  Signs of a past heart  attack.  Fluid buildup around the heart.  Thickening of the heart muscle.  A tumor or infectious growth around the heart valves. Tell a health care provider about:  Any allergies you have.  All medicines you are taking, including vitamins, herbs, eye drops, creams, and over-the-counter medicines.  Any blood disorders you have.  Any surgeries you have had.  Any medical conditions you have.  Whether you are pregnant or may be pregnant. What are the risks? Generally, this is a safe procedure. However, problems may occur, including:  Allergic reaction to dye (contrast) that may be used during the procedure. What happens before the procedure? No specific preparation is needed. You may eat and drink normally. What happens during the procedure?   An IV tube may be inserted into one of your veins.  You may receive contrast through this tube. A contrast is an injection that improves the quality of the pictures from your heart.  A gel will be applied to your chest.  A wand-like tool (transducer) will be moved over your chest. The gel will help to transmit the sound waves from the transducer.  The sound waves will harmlessly bounce off of your heart to allow the heart images to be captured in real-time motion. The images will be recorded on a computer. The procedure may vary among health care providers and hospitals. What happens after the procedure?  You may return to your normal, everyday life, including diet, activities, and medicines, unless your health care provider tells you not to do that. Summary  An echocardiogram is a procedure that uses painless sound waves (ultrasound) to produce an image of   the heart.  Images from an echocardiogram can provide important information about the size and shape of your heart, heart muscle function, heart valve function, and fluid buildup around your heart.  You do not need to do anything to prepare before this procedure. You may eat and  drink normally.  After the echocardiogram is completed, you may return to your normal, everyday life, unless your health care provider tells you not to do that. This information is not intended to replace advice given to you by your health care provider. Make sure you discuss any questions you have with your health care provider. Document Released: 05/13/2000 Document Revised: 09/06/2018 Document Reviewed: 06/18/2016 Elsevier Patient Education  2020 [employer] Inc[employer]   

## 2019-05-16 NOTE — Addendum Note (Signed)
Addended by: Polly Cobia A on: 05/16/2019 04:44 PM   Modules accepted: Orders

## 2019-07-08 ENCOUNTER — Ambulatory Visit (INDEPENDENT_AMBULATORY_CARE_PROVIDER_SITE_OTHER): Payer: Medicare Other

## 2019-07-08 ENCOUNTER — Other Ambulatory Visit: Payer: Self-pay

## 2019-07-08 DIAGNOSIS — E1143 Type 2 diabetes mellitus with diabetic autonomic (poly)neuropathy: Secondary | ICD-10-CM | POA: Diagnosis not present

## 2019-07-08 DIAGNOSIS — Z720 Tobacco use: Secondary | ICD-10-CM | POA: Diagnosis not present

## 2019-07-08 DIAGNOSIS — E782 Mixed hyperlipidemia: Secondary | ICD-10-CM

## 2019-07-08 DIAGNOSIS — I2510 Atherosclerotic heart disease of native coronary artery without angina pectoris: Secondary | ICD-10-CM

## 2019-07-08 DIAGNOSIS — I10 Essential (primary) hypertension: Secondary | ICD-10-CM

## 2019-07-08 NOTE — Progress Notes (Signed)
Complete echocardiogram has been performed.  Jimmy Kasheem Toner RDCS, RVT 

## 2019-09-26 DIAGNOSIS — E782 Mixed hyperlipidemia: Secondary | ICD-10-CM

## 2019-09-26 DIAGNOSIS — Z7982 Long term (current) use of aspirin: Secondary | ICD-10-CM | POA: Insufficient documentation

## 2019-09-26 HISTORY — DX: Long term (current) use of aspirin: Z79.82

## 2019-09-26 HISTORY — DX: Mixed hyperlipidemia: E78.2

## 2019-12-07 DIAGNOSIS — R1030 Lower abdominal pain, unspecified: Secondary | ICD-10-CM | POA: Diagnosis not present

## 2019-12-07 DIAGNOSIS — R1909 Other intra-abdominal and pelvic swelling, mass and lump: Secondary | ICD-10-CM | POA: Diagnosis not present

## 2019-12-07 DIAGNOSIS — L02214 Cutaneous abscess of groin: Secondary | ICD-10-CM | POA: Diagnosis not present

## 2019-12-10 DIAGNOSIS — L02214 Cutaneous abscess of groin: Secondary | ICD-10-CM | POA: Diagnosis not present

## 2020-01-13 DIAGNOSIS — Z7901 Long term (current) use of anticoagulants: Secondary | ICD-10-CM | POA: Diagnosis not present

## 2020-01-13 DIAGNOSIS — J449 Chronic obstructive pulmonary disease, unspecified: Secondary | ICD-10-CM | POA: Diagnosis not present

## 2020-01-13 DIAGNOSIS — E1165 Type 2 diabetes mellitus with hyperglycemia: Secondary | ICD-10-CM | POA: Diagnosis not present

## 2020-01-13 DIAGNOSIS — I10 Essential (primary) hypertension: Secondary | ICD-10-CM | POA: Diagnosis not present

## 2020-01-13 DIAGNOSIS — I2510 Atherosclerotic heart disease of native coronary artery without angina pectoris: Secondary | ICD-10-CM | POA: Diagnosis not present

## 2020-04-21 DIAGNOSIS — M79671 Pain in right foot: Secondary | ICD-10-CM | POA: Diagnosis not present

## 2020-04-21 DIAGNOSIS — S93601A Unspecified sprain of right foot, initial encounter: Secondary | ICD-10-CM | POA: Diagnosis not present

## 2020-06-17 DIAGNOSIS — J449 Chronic obstructive pulmonary disease, unspecified: Secondary | ICD-10-CM | POA: Diagnosis not present

## 2020-06-17 DIAGNOSIS — Z7689 Persons encountering health services in other specified circumstances: Secondary | ICD-10-CM | POA: Diagnosis not present

## 2020-06-17 DIAGNOSIS — G894 Chronic pain syndrome: Secondary | ICD-10-CM | POA: Diagnosis not present

## 2020-06-17 DIAGNOSIS — S91309A Unspecified open wound, unspecified foot, initial encounter: Secondary | ICD-10-CM | POA: Diagnosis not present

## 2020-06-17 DIAGNOSIS — Z8674 Personal history of sudden cardiac arrest: Secondary | ICD-10-CM | POA: Diagnosis not present

## 2020-06-17 DIAGNOSIS — E785 Hyperlipidemia, unspecified: Secondary | ICD-10-CM | POA: Diagnosis not present

## 2020-06-17 DIAGNOSIS — E1165 Type 2 diabetes mellitus with hyperglycemia: Secondary | ICD-10-CM | POA: Diagnosis not present

## 2020-06-23 DIAGNOSIS — E785 Hyperlipidemia, unspecified: Secondary | ICD-10-CM | POA: Diagnosis not present

## 2020-06-23 DIAGNOSIS — E1165 Type 2 diabetes mellitus with hyperglycemia: Secondary | ICD-10-CM | POA: Diagnosis not present

## 2020-06-23 DIAGNOSIS — E559 Vitamin D deficiency, unspecified: Secondary | ICD-10-CM | POA: Diagnosis not present

## 2020-07-07 ENCOUNTER — Other Ambulatory Visit: Payer: Self-pay

## 2020-07-07 DIAGNOSIS — M1990 Unspecified osteoarthritis, unspecified site: Secondary | ICD-10-CM | POA: Insufficient documentation

## 2020-07-07 DIAGNOSIS — J45909 Unspecified asthma, uncomplicated: Secondary | ICD-10-CM | POA: Insufficient documentation

## 2020-07-07 DIAGNOSIS — I10 Essential (primary) hypertension: Secondary | ICD-10-CM | POA: Insufficient documentation

## 2020-07-07 DIAGNOSIS — R195 Other fecal abnormalities: Secondary | ICD-10-CM | POA: Insufficient documentation

## 2020-07-07 DIAGNOSIS — I509 Heart failure, unspecified: Secondary | ICD-10-CM | POA: Insufficient documentation

## 2020-07-09 ENCOUNTER — Encounter: Payer: Self-pay | Admitting: Cardiology

## 2020-07-09 ENCOUNTER — Other Ambulatory Visit: Payer: Self-pay

## 2020-07-09 ENCOUNTER — Ambulatory Visit (INDEPENDENT_AMBULATORY_CARE_PROVIDER_SITE_OTHER): Payer: Medicare Other | Admitting: Cardiology

## 2020-07-09 VITALS — BP 122/74 | HR 76 | Ht 76.0 in | Wt 193.2 lb

## 2020-07-09 DIAGNOSIS — E782 Mixed hyperlipidemia: Secondary | ICD-10-CM | POA: Diagnosis not present

## 2020-07-09 DIAGNOSIS — I2510 Atherosclerotic heart disease of native coronary artery without angina pectoris: Secondary | ICD-10-CM | POA: Diagnosis not present

## 2020-07-09 DIAGNOSIS — I714 Abdominal aortic aneurysm, without rupture, unspecified: Secondary | ICD-10-CM

## 2020-07-09 DIAGNOSIS — E119 Type 2 diabetes mellitus without complications: Secondary | ICD-10-CM

## 2020-07-09 DIAGNOSIS — I10 Essential (primary) hypertension: Secondary | ICD-10-CM

## 2020-07-09 NOTE — Patient Instructions (Signed)
Medication Instructions:  Your physician recommends that you continue on your current medications as directed. Please refer to the Current Medication list given to you today.  *If you need a refill on your cardiac medications before your next appointment, please call your pharmacy*   Lab Work: Your physician recommends that you return for lab work in the next few days: bmp, cbc, tsh, lft, lipid  If you have labs (blood work) drawn today and your tests are completely normal, you will receive your results only by: Marland Kitchen MyChart Message (if you have MyChart) OR . A paper copy in the mail If you have any lab test that is abnormal or we need to change your treatment, we will call you to review the results.   Testing/Procedures: None   Follow-Up: At [facility], you and your health needs are our priority.  As part of our continuing mission to provide you with exceptional heart care, we have created designated Provider Care Teams.  These Care Teams include your primary Cardiologist (physician) and Advanced Practice Providers (APPs -  Physician Assistants and Nurse Practitioners) who all work together to provide you with the care you need, when you need it.  We recommend signing up for the patient portal called "MyChart".  Sign up information is provided on this After Visit Summary.  MyChart is used to connect with patients for Virtual Visits (Telemedicine).  Patients are able to view lab/test results, encounter notes, upcoming appointments, etc.  Non-urgent messages can be sent to your provider as well.   To learn more about what you can do with MyChart, go to [email or web address].    Your next appointment:   4 month(s)  The format for your next appointment:   In Person  Provider:   Belva Crome, MD   Other Instructions

## 2020-07-09 NOTE — Progress Notes (Signed)
Cardiology Office Note:    Date:  07/09/2020   ID:  [name], DOB 01/17/1961, MRN [id number]  PCP:  Eber Jones, NP  Cardiologist:  Garwin Brothers, MD   Referring MD: Lise Auer, MD    ASSESSMENT:    1. Coronary artery disease involving native coronary artery of native heart without angina pectoris   2. Essential hypertension   3. Mixed hyperlipidemia   4. Type 2 diabetes mellitus without complication, without long-term current use of insulin (HCC)    PLAN:    In order of problems listed above:  1. Coronary artery disease: Secondary prevention stressed with the patient.  Importance of compliance with diet medication stressed and vocalized understanding.  I told him to walk at least half an hour a day 5 days a week and he promises to do so. 2. Cigarette smoker: I spent 5 minutes with the patient discussing solely about smoking. Smoking cessation was counseled. I suggested to the patient also different medications and pharmacological interventions. Patient is keen to try stopping on its own at this time. He will get back to me if he needs any further assistance in this matter. 3. Essential hypertension: Blood pressure stable and diet was emphasized.  Lifestyle modification urged. 4. Mixed dyslipidemia: On statin therapy.  Triglycerides were elevated on the KPN sheet.  I told him to come back in the next few days to get blood work done. 5. Patient has history of abdominal aortic aneurysm mentioned in the chart and we will rescan him with ultrasound in the next few days to assess this.  He is a smoker and this would be important. 6. Patient will be seen in follow-up appointment in 6 months or earlier if the patient has any concerns   Medication Adjustments/Labs and Tests Ordered: Current medicines are reviewed at length with the patient today.  Concerns regarding medicines are outlined above.  Orders Placed This Encounter  Procedures  . Basic metabolic panel  .  CBC  . TSH  . Hepatic function panel  . Lipid panel  . EKG 12-Lead   No orders of the defined types were placed in this encounter.    No chief complaint on file.    History of Present Illness:    [name] Newton is a 60 y.o. male.  Patient has past medical history of coronary artery disease, essential hypertension mixed dyslipidemia.  There is mention of abdominal aortic aneurysm.  He also unfortunately continues to smoke.  At the time of my evaluation, the patient is alert awake oriented and in no distress.  Past Medical History:  Diagnosis Date  . Abdominal aortic aneurysm (AAA) without rupture (HCC) 05/15/2017   Formatting of this note might be different from the original. Infra-renal AAA measuring 3.1cm seen on CT 05/05/17 Formatting of this note might be different from the original. Infra-renal AAA measuring 3.1cm seen on CT 05/05/17 Formatting of this note might be different from the original. Infra-renal AAA measuring 3.1cm seen on CT 05/05/17 Infra-renal AAA measuring 3.1cm seen on CT 05/05/17  . Acute bilateral low back pain without sciatica 01/12/2018  . Acute shoulder pain due to trauma, right 01/12/2018  . ADHD, adult residual type 03/22/2018  . Arthritis   . Asthma   . Atherosclerotic heart disease of native coronary artery without angina pectoris 05/15/2017   Formatting of this note might be different from the original. PCI with BMS to 1st diagonal 04/12 Formatting of this note might be different  from the original. PCI with BMS to 1st diagonal 04/12 S/P successful PCI and bare-metal  stenting of the first diagonal in April of 2012  Last Assessment & Plan:  Pt has had a bare metal stent placed in 2012. He was on coreg but not currently on any beta block  . CHF (congestive heart failure) (HCC)   . Complication of anesthesia 09/16/2016   in 1998 patient dislocated his shoulder and was told he should never be "put under" again because he was very difficult to awake from  anesthesia. He is very concerned about this  . COPD (chronic obstructive pulmonary disease) (HCC)   . Coronary artery disease    S/P successful PCI and bare-metal  stenting of the first diagonal in April of 2012   . Depression 03/27/2017  . Depression, unspecified 03/27/2017  . Diabetes mellitus   . Diabetes type 2, uncontrolled with neuropathy 05/30/2010   Formatting of this note might be different from the original. Last Assessment & Plan:  Formatting of this note might be different from the original. Lab Results  Component Value Date   HGBA1C 6.8 02/15/2016   HGBA1C 6.7 11/23/2015   HGBA1C 9.5 07/23/2015   Patient's A1c is 6.8, which is at goal, and he is compliant with his metformin and glipizide daily. He still continues to check his CBGs daily   . Diabetic autonomic neuropathy associated with type 2 diabetes mellitus (HCC) 07/06/2016  . Dyslipidemia 07/06/2016  . Effusion of bursa of elbow, left 09/08/2016  . Erectile dysfunction 07/26/2015  . Essential hypertension 07/26/2015   Formatting of this note might be different from the original. Last Assessment & Plan:  BP well controlled. Last Assessment & Plan:  BP well controlled.  . Fatty infiltration of liver 07/18/2012   CT scan 2012 - Fatty infiltration of the liver. LFTs normal in Jan 2014 No hepatitis panel     . Gastroesophageal reflux disease without esophagitis 07/24/2013   Formatting of this note might be different from the original. Last Assessment & Plan:  No acute complaints. Nexium 20 mg daily refilled. Formatting of this note might be different from the original. Last Assessment & Plan:  No acute complaints. Nexium 20 mg daily refilled. Last Assessment & Plan:  No acute complaints. Nexium 20 mg daily refilled.  . Guaiac positive stools     Guaiac positive stool with stable H and H.   . Healthcare maintenance 07/15/2014  . Hyperlipidemia   . Hyperlipidemia due to type 2 diabetes mellitus (HCC) 03/21/2017  . Hypertension   .  Hypertriglyceridemia   . Long-term use of aspirin therapy 09/26/2019  . MDD (major depressive disorder), recurrent severe, without psychosis (HCC) 10/28/2016  . 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 Mixed dyslipidemia 05/14/2019  . Mixed hyperlipidemia 09/26/2019  . NSTEMI (non-ST elevated myocardial infarction) [facility]) April 2012   status post successful PCI and bare-metal  stenting of the first diagonal this admission.   . OSA (obstructive sleep apnea) 08/22/2017  . Rash 02/15/2016  . Stable angina (HCC) 02/15/2016  . Tobacco abuse   . Traumatic compression fracture of T11 thoracic vertebra, with delayed healing, subsequent encounter 07/28/2017  . Type 2 diabetes mellitus without complication, without long-term current use of insulin (HCC) 03/21/2017    Past Surgical History:  Procedure Laterality Date  . CARDIAC CATHETERIZATION     Normal EF. 90% diagonal lesion  . CLOSED REDUCTION SHOULDER DISLOCATION Right 1998  . CORONARY STENT PLACEMENT  April 2012   Diagonal lesion. Bare metal  . OLECRANON BURSECTOMY Left 09/20/2016   Procedure: EXCISION OLECRANON BURSA;  Surgeon: Cammy Copa, MD;  Location: [facility] OR;  Service: Orthopedics;  Laterality: Left;  . UMBILICAL HERNIA REPAIR      Current Medications: Current Meds  Medication Sig  . albuterol (PROAIR HFA) 108 (90 Base) MCG/ACT inhaler Inhale 2 puffs into the lungs every 6 (six) hours as needed for wheezing or shortness of breath.  Marland Kitchen aspirin 81 MG EC tablet Take 81 mg by mouth daily.  Marland Kitchen atorvastatin (LIPITOR) 80 MG tablet Take 80 mg by mouth daily.  Marland Kitchen BRILINTA 90 MG TABS tablet Take 90 mg by mouth 2 (two) times daily.  Marland Kitchen FARXIGA 10 MG TABS tablet Take 10 mg by mouth daily.  . fenofibrate (TRICOR) 145 MG tablet Take 145 mg by mouth daily.  . metoprolol tartrate (LOPRESSOR) 25 MG tablet Take 25 mg by mouth 2 (two) times daily.  . nitroGLYCERIN (NITROSTAT) 0.4 MG SL tablet DISSOLVE 1 TABLET UNDER  THE TONGUE EVERY 5 MINUTES AS  NEEDED: FOR CHEST PAIN  . pantoprazole (PROTONIX) 40 MG tablet Take 40 mg by mouth daily.  . SYMBICORT 80-4.5 MCG/ACT inhaler Inhale 2 puffs into the lungs 2 (two) times daily.     Allergies:   Codeine   Social History   Socioeconomic History  . Marital status: Divorced    Spouse name: Not on file  . Number of children: Not on file  . Years of education: 59  . Highest education level: Not on file  Occupational History  . Occupation: none    Comment: Unemployed since 2012  Tobacco Use  . Smoking status: Current Some Day Smoker    Packs/day: 2.00    Types: Cigarettes  . Smokeless tobacco: Former Neurosurgeon    Types: Chew  . Tobacco comment: Patches do not work.  Cutting back.  Wants Chantix  Substance and Sexual Activity  . Alcohol use: No    Alcohol/week: 0.0 standard drinks  . Drug use: Yes    Types: Marijuana  . Sexual activity: Yes    Birth control/protection: None  Other Topics Concern  . Not on file  Social History Narrative   Patient is intermittently homeless without any income at the moment.          Social Determinants of Health   Financial Resource Strain: Not on file  Food Insecurity: Not on file  Transportation Needs: Not on file  Physical Activity: Not on file  Stress: Not on file  Social Connections: Not on file     Family History: The patient's family history includes Coronary artery disease in his brother; Heart attack in his brother and father.  ROS:   Please see the history of present illness.    All other systems reviewed and are negative.  EKGs/Labs/Other Studies Reviewed:    The following studies were reviewed today: I discussed my findings with the patient at length.   Recent Labs: No results found for requested labs within last 8760 hours.  Recent Lipid Panel    Component Value Date/Time   CHOL 225 (H) 10/30/2016 0616   CHOL 380 (H) 07/06/2016 1324   TRIG 217 (H) 10/30/2016 0616   HDL 35 (L)  10/30/2016 0616   HDL 32 (L) 07/06/2016 1324   CHOLHDL 6.4 10/30/2016 0616   VLDL 43 (H) 10/30/2016 0616   LDLCALC 147 (H) 10/30/2016 0616   LDLCALC Comment 07/06/2016 1324   LDLDIRECT 152 (  H) 12/16/2014 0249    Physical Exam:    VS:  BP 122/74   Pulse 76   Ht 6\' 4"  (1.93 m)   Wt 193 lb 3.2 oz (87.6 kg)   SpO2 94%   BMI 23.52 kg/m     Wt Readings from Last 3 Encounters:  07/09/20 193 lb 3.2 oz (87.6 kg)  05/14/19 195 lb 12.8 oz (88.8 kg)  12/16/18 210 lb (95.3 kg)     GEN: Patient is in no acute distress HEENT: Normal NECK: No JVD; No carotid bruits LYMPHATICS: No lymphadenopathy CARDIAC: Hear sounds regular, 2/6 systolic murmur at the apex. RESPIRATORY:  Clear to auscultation without rales, wheezing or rhonchi  ABDOMEN: Soft, non-tender, non-distended MUSCULOSKELETAL:  No edema; No deformity  SKIN: Warm and dry NEUROLOGIC:  Alert and oriented x 3 PSYCHIATRIC:  Normal affect   Signed, Garwin Brothers, MD  07/09/2020 2:20 PM    [facility]

## 2020-08-11 ENCOUNTER — Other Ambulatory Visit: Payer: Medicare Other

## 2020-10-15 ENCOUNTER — Ambulatory Visit (INDEPENDENT_AMBULATORY_CARE_PROVIDER_SITE_OTHER): Payer: Medicare Other | Admitting: Podiatry

## 2020-10-15 ENCOUNTER — Other Ambulatory Visit: Payer: Self-pay | Admitting: *Deleted

## 2020-10-15 ENCOUNTER — Other Ambulatory Visit: Payer: Self-pay

## 2020-10-15 ENCOUNTER — Encounter: Payer: Self-pay | Admitting: Podiatry

## 2020-10-15 DIAGNOSIS — L97411 Non-pressure chronic ulcer of right heel and midfoot limited to breakdown of skin: Secondary | ICD-10-CM

## 2020-10-15 MED ORDER — SILVER SULFADIAZINE 1 % EX CREA: TOPICAL_CREAM | CUTANEOUS | 0 refills | Status: AC

## 2020-10-15 NOTE — Progress Notes (Signed)
  Subjective:  Patient ID: [name] Newton, male    DOB: 14-Jul-1960,  MRN: [id number]  Chief Complaint  Patient presents with  . Foot Ulcer    I have a spot on the right ankle and I have gone to the doctor and sent me to [place] to see a foot doctor and they wanted like $15     60 y.o. male presents for wound care. Hx confirmed with patient. Unsure how it started. Present for 2 months. Has been soaking in salt water. DM with last A1c 9.0 Objective:  Physical Exam: Wound Location: right heel posterior Wound Measurement: 0.2x0.3 Wound Base: Granular/Healthy Peri-wound: dry Exudate: None: wound tissue dry wound without warmth, erythema, signs of acute infection Assessment:   1. Ulcer of heel, right, limited to breakdown of skin [facility])    Plan:  Patient was evaluated and treated and all questions answered.  Ulcer right heel -Dressing applied consisting of silvadene and band-aid -Wound cleansed and debrided Procedure: Selective Debridement of Wound Rationale: Removal of devitalized tissue from the wound to promote healing.  Pre-Debridement Wound Measurements: overlying HPK  Post-Debridement Wound Measurements: 0.2x0.3 Type of Debridement: sharp selective Instrumentation: dermal curette, tissue nipper Tissue Removed: Devitalized soft-tissue Dressing: Dry, sterile, compression dressing. Disposition: Patient tolerated procedure well.   Return if symptoms worsen or fail to improve.

## 2020-11-06 ENCOUNTER — Ambulatory Visit: Payer: Medicare Other | Admitting: Cardiology

## 2021-07-06 ENCOUNTER — Other Ambulatory Visit: Payer: Self-pay | Admitting: Cardiology

## 2021-07-06 DIAGNOSIS — I714 Abdominal aortic aneurysm, without rupture, unspecified: Secondary | ICD-10-CM

## 2021-10-28 DEATH — deceased
